# Patient Record
Sex: Female | Born: 1945 | Race: White | Hispanic: No | Marital: Married | Smoking: Never smoker
Health system: Southern US, Community
[De-identification: ages and names within clinical notes are randomized; demographics above are authoritative.]

## PROBLEM LIST (undated history)

## (undated) DIAGNOSIS — M1990 Unspecified osteoarthritis, unspecified site: Secondary | ICD-10-CM

## (undated) DIAGNOSIS — I10 Essential (primary) hypertension: Secondary | ICD-10-CM

## (undated) DIAGNOSIS — Z98811 Dental restoration status: Secondary | ICD-10-CM

## (undated) DIAGNOSIS — B059 Measles without complication: Secondary | ICD-10-CM

## (undated) DIAGNOSIS — M7071 Other bursitis of hip, right hip: Secondary | ICD-10-CM

## (undated) DIAGNOSIS — J309 Allergic rhinitis, unspecified: Secondary | ICD-10-CM

## (undated) DIAGNOSIS — B069 Rubella without complication: Secondary | ICD-10-CM

## (undated) DIAGNOSIS — K859 Acute pancreatitis without necrosis or infection, unspecified: Secondary | ICD-10-CM

## (undated) DIAGNOSIS — T8859XA Other complications of anesthesia, initial encounter: Secondary | ICD-10-CM

## (undated) DIAGNOSIS — M7062 Trochanteric bursitis, left hip: Secondary | ICD-10-CM

## (undated) DIAGNOSIS — B009 Herpesviral infection, unspecified: Secondary | ICD-10-CM

## (undated) DIAGNOSIS — M8580 Other specified disorders of bone density and structure, unspecified site: Secondary | ICD-10-CM

## (undated) DIAGNOSIS — Z8719 Personal history of other diseases of the digestive system: Secondary | ICD-10-CM

## (undated) DIAGNOSIS — L309 Dermatitis, unspecified: Secondary | ICD-10-CM

## (undated) DIAGNOSIS — A048 Other specified bacterial intestinal infections: Secondary | ICD-10-CM

## (undated) DIAGNOSIS — B269 Mumps without complication: Secondary | ICD-10-CM

## (undated) DIAGNOSIS — R2230 Localized swelling, mass and lump, unspecified upper limb: Secondary | ICD-10-CM

## (undated) DIAGNOSIS — K219 Gastro-esophageal reflux disease without esophagitis: Secondary | ICD-10-CM

## (undated) DIAGNOSIS — T4145XA Adverse effect of unspecified anesthetic, initial encounter: Secondary | ICD-10-CM

## (undated) HISTORY — PX: TUBAL LIGATION: SHX77

## (undated) HISTORY — DX: Other specified disorders of bone density and structure, unspecified site: M85.80

## (undated) HISTORY — PX: ABDOMINAL HYSTERECTOMY: SHX81

## (undated) HISTORY — DX: Acute pancreatitis without necrosis or infection, unspecified: K85.90

## (undated) HISTORY — PX: CATARACT EXTRACTION W/ INTRAOCULAR LENS  IMPLANT, BILATERAL: SHX1307

## (undated) HISTORY — PX: OTHER SURGICAL HISTORY: SHX169

## (undated) HISTORY — DX: Other specified bacterial intestinal infections: A04.8

## (undated) HISTORY — PX: MOUTH SURGERY: SHX715

## (undated) HISTORY — PX: ROTATOR CUFF REPAIR: SHX139

## (undated) HISTORY — DX: Allergic rhinitis, unspecified: J30.9

## (undated) HISTORY — PX: EYE SURGERY: SHX253

## (undated) HISTORY — DX: Herpesviral infection, unspecified: B00.9

---

## 1987-02-25 HISTORY — PX: OTHER SURGICAL HISTORY: SHX169

## 1998-02-21 ENCOUNTER — Ambulatory Visit (HOSPITAL_COMMUNITY): Admission: RE | Admit: 1998-02-21 | Discharge: 1998-02-21 | Payer: Self-pay | Admitting: Internal Medicine

## 1998-08-29 ENCOUNTER — Other Ambulatory Visit: Admission: RE | Admit: 1998-08-29 | Discharge: 1998-08-29 | Payer: Self-pay | Admitting: Obstetrics and Gynecology

## 1999-09-20 ENCOUNTER — Other Ambulatory Visit: Admission: RE | Admit: 1999-09-20 | Discharge: 1999-09-20 | Payer: Self-pay | Admitting: Obstetrics and Gynecology

## 2000-09-21 ENCOUNTER — Other Ambulatory Visit: Admission: RE | Admit: 2000-09-21 | Discharge: 2000-09-21 | Payer: Self-pay | Admitting: Obstetrics and Gynecology

## 2001-11-25 ENCOUNTER — Other Ambulatory Visit: Admission: RE | Admit: 2001-11-25 | Discharge: 2001-11-25 | Payer: Self-pay | Admitting: Obstetrics and Gynecology

## 2003-02-13 ENCOUNTER — Encounter (INDEPENDENT_AMBULATORY_CARE_PROVIDER_SITE_OTHER): Payer: Self-pay | Admitting: *Deleted

## 2003-02-13 ENCOUNTER — Ambulatory Visit (HOSPITAL_COMMUNITY): Admission: RE | Admit: 2003-02-13 | Discharge: 2003-02-13 | Payer: Self-pay | Admitting: Vascular Surgery

## 2003-02-13 HISTORY — PX: VARICOSE VEIN SURGERY: SHX832

## 2003-03-08 ENCOUNTER — Other Ambulatory Visit: Admission: RE | Admit: 2003-03-08 | Discharge: 2003-03-08 | Payer: Self-pay | Admitting: Obstetrics and Gynecology

## 2003-04-11 ENCOUNTER — Other Ambulatory Visit: Admission: RE | Admit: 2003-04-11 | Discharge: 2003-04-11 | Payer: Self-pay | Admitting: Obstetrics and Gynecology

## 2003-04-20 ENCOUNTER — Encounter: Admission: RE | Admit: 2003-04-20 | Discharge: 2003-04-20 | Payer: Self-pay | Admitting: Family Medicine

## 2003-07-20 ENCOUNTER — Encounter: Payer: Self-pay | Admitting: Internal Medicine

## 2003-07-27 ENCOUNTER — Emergency Department (HOSPITAL_COMMUNITY): Admission: EM | Admit: 2003-07-27 | Discharge: 2003-07-27 | Payer: Self-pay | Admitting: Emergency Medicine

## 2004-03-19 ENCOUNTER — Other Ambulatory Visit: Admission: RE | Admit: 2004-03-19 | Discharge: 2004-03-19 | Payer: Self-pay | Admitting: Obstetrics and Gynecology

## 2005-06-30 ENCOUNTER — Other Ambulatory Visit: Admission: RE | Admit: 2005-06-30 | Discharge: 2005-06-30 | Payer: Self-pay | Admitting: Obstetrics and Gynecology

## 2006-08-11 ENCOUNTER — Other Ambulatory Visit: Admission: RE | Admit: 2006-08-11 | Discharge: 2006-08-11 | Payer: Self-pay | Admitting: Obstetrics and Gynecology

## 2006-12-15 ENCOUNTER — Ambulatory Visit: Payer: Self-pay | Admitting: Vascular Surgery

## 2007-08-12 ENCOUNTER — Other Ambulatory Visit: Admission: RE | Admit: 2007-08-12 | Discharge: 2007-08-12 | Payer: Self-pay | Admitting: Obstetrics and Gynecology

## 2008-06-20 ENCOUNTER — Encounter (INDEPENDENT_AMBULATORY_CARE_PROVIDER_SITE_OTHER): Payer: Self-pay | Admitting: *Deleted

## 2008-07-12 ENCOUNTER — Ambulatory Visit: Payer: Self-pay | Admitting: Internal Medicine

## 2008-07-25 ENCOUNTER — Ambulatory Visit: Payer: Self-pay | Admitting: Internal Medicine

## 2008-08-15 ENCOUNTER — Encounter: Payer: Self-pay | Admitting: Obstetrics and Gynecology

## 2008-08-15 ENCOUNTER — Ambulatory Visit: Payer: Self-pay | Admitting: Obstetrics and Gynecology

## 2008-08-15 ENCOUNTER — Other Ambulatory Visit: Admission: RE | Admit: 2008-08-15 | Discharge: 2008-08-15 | Payer: Self-pay | Admitting: Obstetrics and Gynecology

## 2009-09-12 ENCOUNTER — Ambulatory Visit: Payer: Self-pay | Admitting: Obstetrics and Gynecology

## 2009-09-12 ENCOUNTER — Other Ambulatory Visit: Admission: RE | Admit: 2009-09-12 | Discharge: 2009-09-12 | Payer: Self-pay | Admitting: Obstetrics and Gynecology

## 2009-09-24 ENCOUNTER — Ambulatory Visit: Payer: Self-pay | Admitting: Obstetrics and Gynecology

## 2010-01-09 ENCOUNTER — Encounter: Admission: RE | Admit: 2010-01-09 | Discharge: 2010-01-09 | Payer: Self-pay | Admitting: Family Medicine

## 2010-01-09 ENCOUNTER — Encounter (INDEPENDENT_AMBULATORY_CARE_PROVIDER_SITE_OTHER): Payer: Self-pay | Admitting: *Deleted

## 2010-01-12 ENCOUNTER — Encounter (INDEPENDENT_AMBULATORY_CARE_PROVIDER_SITE_OTHER): Payer: Self-pay | Admitting: *Deleted

## 2010-01-12 ENCOUNTER — Emergency Department (HOSPITAL_BASED_OUTPATIENT_CLINIC_OR_DEPARTMENT_OTHER): Admission: EM | Admit: 2010-01-12 | Discharge: 2010-01-12 | Payer: Self-pay | Admitting: Emergency Medicine

## 2010-01-12 ENCOUNTER — Ambulatory Visit: Payer: Self-pay | Admitting: Diagnostic Radiology

## 2010-01-14 ENCOUNTER — Telehealth: Payer: Self-pay | Admitting: Internal Medicine

## 2010-01-16 ENCOUNTER — Ambulatory Visit: Payer: Self-pay | Admitting: Gastroenterology

## 2010-01-16 DIAGNOSIS — R112 Nausea with vomiting, unspecified: Secondary | ICD-10-CM | POA: Insufficient documentation

## 2010-01-16 DIAGNOSIS — K5900 Constipation, unspecified: Secondary | ICD-10-CM | POA: Insufficient documentation

## 2010-01-16 DIAGNOSIS — J309 Allergic rhinitis, unspecified: Secondary | ICD-10-CM | POA: Insufficient documentation

## 2010-01-16 DIAGNOSIS — F411 Generalized anxiety disorder: Secondary | ICD-10-CM | POA: Insufficient documentation

## 2010-01-16 DIAGNOSIS — K5730 Diverticulosis of large intestine without perforation or abscess without bleeding: Secondary | ICD-10-CM | POA: Insufficient documentation

## 2010-01-16 DIAGNOSIS — E785 Hyperlipidemia, unspecified: Secondary | ICD-10-CM | POA: Insufficient documentation

## 2010-01-16 DIAGNOSIS — Z860100 Personal history of colon polyps, unspecified: Secondary | ICD-10-CM | POA: Insufficient documentation

## 2010-01-24 ENCOUNTER — Telehealth (INDEPENDENT_AMBULATORY_CARE_PROVIDER_SITE_OTHER): Payer: Self-pay | Admitting: *Deleted

## 2010-01-30 ENCOUNTER — Ambulatory Visit (HOSPITAL_COMMUNITY)
Admission: RE | Admit: 2010-01-30 | Discharge: 2010-01-30 | Payer: Self-pay | Source: Home / Self Care | Attending: Gastroenterology | Admitting: Gastroenterology

## 2010-02-01 ENCOUNTER — Telehealth: Payer: Self-pay | Admitting: Physician Assistant

## 2010-02-05 ENCOUNTER — Telehealth (INDEPENDENT_AMBULATORY_CARE_PROVIDER_SITE_OTHER): Payer: Self-pay | Admitting: *Deleted

## 2010-02-05 ENCOUNTER — Encounter (INDEPENDENT_AMBULATORY_CARE_PROVIDER_SITE_OTHER): Payer: Self-pay | Admitting: *Deleted

## 2010-02-05 ENCOUNTER — Ambulatory Visit: Payer: Self-pay | Admitting: Internal Medicine

## 2010-02-07 ENCOUNTER — Ambulatory Visit: Payer: Self-pay | Admitting: Internal Medicine

## 2010-02-07 ENCOUNTER — Encounter: Payer: Self-pay | Admitting: Internal Medicine

## 2010-02-07 DIAGNOSIS — K2970 Gastritis, unspecified, without bleeding: Secondary | ICD-10-CM | POA: Insufficient documentation

## 2010-02-07 DIAGNOSIS — K2990 Gastroduodenitis, unspecified, without bleeding: Secondary | ICD-10-CM

## 2010-02-12 LAB — CONVERTED CEMR LAB: UREASE: POSITIVE

## 2010-03-12 ENCOUNTER — Telehealth: Payer: Self-pay | Admitting: Internal Medicine

## 2010-03-26 NOTE — Progress Notes (Signed)
Summary: triage  Phone Note From Other Clinic Call back at [phone]   Caller: Crystal, nurse Call For: Dr. Marina Goodell Reason for Call: Schedule Patient Appt Summary of Call: pt had ER visit over the weekend for abd pain... wants pt worked in sooner then currently sch'ed for 01/04 Initial call taken by: Vallarie Mare,  January 14, 2010 4:06 PM  Follow-up for Phone Call        pt. given appt. with N.P. for Wednesday. Follow-up by: Teryl Lucy RN,  January 15, 2010 8:00 AM

## 2010-03-26 NOTE — Procedures (Signed)
Summary: [facility] COLON   Colonoscopy  Procedure date:  07/20/2003  Findings:      Location:  [facility].    Procedures Next Due Date:    Colonoscopy: 07/2008 Patient Name: Kayla Stokes, Kayla Stokes MRN:  Procedure Procedures: Colonoscopy CPT: 21308.  Personnel: Endoscopist: Wilhemina Bonito. Marina Goodell, MD.  Referred By: Corinna Capra, MD.  Exam Location: Exam performed in Outpatient Clinic. Outpatient  Patient Consent: Procedure, Alternatives, Risks and Benefits discussed, consent obtained, from patient. Consent was obtained by the RN.  Indications Symptoms: Abdominal pain / bloating.  Increased Risk Screening: For family history of colorectal neoplasia, in  parent age at onset: 63.  History  Current Medications: Patient is not currently taking Coumadin.  Pre-Exam Physical: Performed Jul 20, 2003. Entire physical exam was normal.  Exam Exam: Extent of exam reached: Cecum, extent intended: Cecum.  The cecum was identified by appendiceal orifice and IC valve. Patient position: on left side. Colon retroflexion performed. Images taken. ASA Classification: II. Tolerance: excellent.  Monitoring: Pulse and BP monitoring, Oximetry used. Supplemental O2 given.  Colon Prep Used Miralax for colon prep. Prep results: excellent.  Sedation Meds: Patient assessed and found to be appropriate for moderate (conscious) sedation. Fentanyl 125 mcg. given IV. Versed 12 given IV.  Findings NORMAL EXAM: Cecum to Rectum.  - DIVERTICULOSIS: Sigmoid Colon. ICD9: Diverticulosis, Colon: 562.10. Comments: mild.    Comments: NO POLYPS SEEN Assessment Abnormal examination, see findings above.  Diagnoses: 562.10: Diverticulosis, Colon.   Events  Unplanned Interventions: No intervention was required.  Unplanned Events: There were no complications. Plans Disposition: After procedure patient sent to recovery. After recovery patient sent home.  Scheduling/Referral: Colonoscopy, to Wilhemina Bonito.  Marina Goodell, MD, in 5 years,

## 2010-03-26 NOTE — Procedures (Signed)
Summary: [facility] EGD    EGD  Procedure date:  07/20/2003  Findings:      Location: [facility]   Patient Name: [name], Stokes MRN:  Procedure Procedures: Panendoscopy (EGD) CPT: 43235.  Personnel: Endoscopist: Wilhemina Bonito. Marina Goodell, MD.  Referred By: Corinna Capra, MD.  Exam Location: Exam performed in Outpatient Clinic. Outpatient  Patient Consent: Procedure, Alternatives, Risks and Benefits discussed, consent obtained, from patient. Consent was obtained by the RN.  Indications Symptoms: Abdominal pain, location: epigastric.  History  Current Medications: Patient is not currently taking Coumadin.  Pre-Exam Physical: Performed Jul 20, 2003  Entire physical exam was normal.  Exam Exam Info: Maximum depth of insertion Duodenum, intended Duodenum. Patient position: on left side. Vocal cords visualized. Gastric retroflexion performed. Images taken. ASA Classification: II. Tolerance: excellent.  Sedation Meds: Patient assessed and found to be appropriate for moderate (conscious) sedation. Residual sedation present from prior procedure today.  Monitoring: BP and pulse monitoring done. Oximetry used. Supplemental O2 given  Findings Normal: Proximal Esophagus to Duodenal 2nd Portion.    Comments: NO CAUSE FOR PAIN FOUND Assessment Normal examination.  Events  Unplanned Intervention: No unplanned interventions were required.  Unplanned Events: There were no complications. Plans Disposition: After procedure patient sent to recovery. After recovery patient sent home.  Comments: CALL FOR ANY RECURRENT PROBLEMS WITH PAIN   CC: BRENT A. Rosezetta Schlatter, MD  This report was created from the original endoscopy report, which was reviewed and signed by the above listed endoscopist.

## 2010-03-26 NOTE — Progress Notes (Signed)
Summary: verify CCK Hidascan appt  Phone Note Call from Patient   Caller: Patient Call For: Lowry Ram CMA Summary of Call: The pt called to verify her CCK Hidascan scheduled at [facility], Radiology Department on 01-30-10.  She is to arrive at 10:45Am.   Initial call taken by: Joselyn Glassman,  January 24, 2010 2:54 PM

## 2010-03-26 NOTE — Assessment & Plan Note (Signed)
Summary: ULTRASOUND DONE-NO GALLSTONES..LSW.   History of Present Illness Visit Type: Initial Consult Primary GI MD: Yancey Flemings MD Primary Provider: Delorse Lek, MD Requesting Provider: Delorse Lek, MD Chief Complaint: Upper abd pain that radiates to her back with nausea and vomiting, and constipation. Pt states has not started any type of stool softner because she is not sure what to do or take   History of Present Illness:   Kayla Stokes 65 YO FEMALE KNOWN TO DR. PERRY. SHE WAS LAST SEEN FOR COLONOSCOPY IN 6/10 FOR POLYP FOLLOW UP. THIS WAS A NEGATIVE EXAM-SHE DOES HAVE SIGMOID DIVERTICULOSIS. SHE COMES IN TODAY WITH A NEW C/O  EPIGASTRIC PAIN. SHE HAD INITIAL ONSET ON 11/13 WITH SEVERE PAIN WHICH WOKE HER UP,RADIATED IN TO HER BACK, AND WAS ASSOCIATED WITH NAUSEA, AND VOMITING. NO DIARRHEA, NO FEVER. SHE WAS SEEN BY PRIMARY ON 11/15-HAD A CT ABD/PELVIS WHICH WAS NEGATIVE. , THEN UPPER ABDOMINAL US ALSO NORMAL. SHE HAD A SECOND INTENSE ATTACK ON 11/18 AGAIN WAKING HER UP WITH PAIN, NAUSEA AND VOMITING. SHE WENT TO THE ER BECAUSE THE PAIN PERSISTED. IT DIDN'T EASE OFF FOR ABOUT 8 HOURS. IN ER;LABS ALL NORMAL INCLUDING AMYLASE AND LIPASE. SHE HAS BEEN EATING EXTREMELY BLAND SINCE, HAS HAD ONGOING MILD DISCOMFORT WHICH SEEMS WORSE WITH EATING-NO FURTHER NAUSEA, VOMITING. MILD CONSTIPATION. SHE HAS BEEN USUNG PEPTO BIMOL, AND HAS TAKEN A COUPLE VICODIN.  NO ETOH, NO REGULAR ASA OR NSAID USE. SHE HAS A VERY STRONG FAMILY HX OF GB DISEASE-5 SISTERS ALL S/P  CHOLECYTECTOMIES.   GI Review of Systems    Reports abdominal pain, nausea, and  vomiting.     Location of  Abdominal pain: upper abdomen.    Denies acid reflux, belching, bloating, chest pain, dysphagia with liquids, dysphagia with solids, heartburn, loss of appetite, vomiting blood, weight loss, and  weight gain.      Reports change in bowel habits and  constipation.     Denies anal fissure, black tarry stools, diarrhea, diverticulosis,  fecal incontinence, heme positive stool, hemorrhoids, irritable bowel syndrome, jaundice, light color stool, liver problems, rectal bleeding, and  rectal pain.    Current Medications (verified): 1)  Vitamin D 3 / 2000 Unit .... Take 1 Tab Daily 2)  Flaxseed Oil 1000 Mg Caps (Flaxseed (Linseed)) .... Take 1 Tab 3)  Multivitamins  Tabs (Multiple Vitamin) .... Take 1 Tab Daily 4)  Calcium 500 Mg Tabs (Calcium) .... Take 1 Tab Daily 5)  Magnesium Oxide 400 Mg Tabs (Magnesium Oxide) .... Take 1 Tab Daily 6)  Claritin 10 Mg Tabs (Loratadine) .... Take 1 Tab Daily 7)  Aspir-Low 81 Mg Tbec (Aspirin) .... Take 1 Tab Daily 8)  Premarin 0.3 Mg Tabs (Estrogens Conjugated) .... Take 1 Tab Daily 9)  Triamcinolone Acetonide 0.1 % Crea (Triamcinolone Acetonide) .... Apply Cream Twice Daily 10)  Crestor 10 Mg Tabs (Rosuvastatin Calcium) .... Take 1/2 Tab Daily 11)  Alprazolam 0.25 Mg Tabs (Alprazolam) .... Take 1 Tab Daily 12)  Triamterene-Hctz 37.5-25 Mg Tabs (Triamterene-Hctz) .... One Tablet By Mouth Once Daily 13)  Hydrocodone-Acetaminophen 5-325 Mg Tabs (Hydrocodone-Acetaminophen) .Marland Kitchen.. 1-2 Tablets By Mouth Every 4-6 Hours As Needed For Pain 14)  Zofran 8 Mg Tabs (Ondansetron Hcl) .... One Tablet By Mouth Two Times A Day As Needed For Nausea 15)  Pepto-Bismol 524 Mg/8ml Susp (Bismuth Subsalicylate) .... As Needed  Allergies (verified): No Known Drug Allergies  Past History:  Past Medical History: ALLERGIC RHINITIS (ICD-477.9) ANXIETY (ICD-300.00) ADENOCARCINOMA, COLON, FAMILY HX (  ICD-V16.0) DIVERTICULAR DISEASE (ICD-562.10) HYPERLIPIDEMIA (ICD-272.4)  HYPERTENSION (ICD-401.9)  Past Surgical History: Oral Surgery Tooth Extraction Hysterectomy Left Leg Vein Stripping   Family History: Family History of Diabetes:  Family History of Heart Disease: Hypertension Family History of  Alcoholism Family History of Stroke Syndrome Family History of Colon Cancer:Mother--70 years   Social  History: Retired Married Patient has never smoked.  Alcohol Use - yes: occ  Daily Caffeine Use: coffee daily  Illicit Drug Use - no Drug Use:  no  Review of Systems       The patient complains of allergy/sinus and back pain.  The patient denies anemia, anxiety-new, arthritis/joint pain, blood in urine, breast changes/lumps, change in vision, confusion, cough, coughing up blood, depression-new, fainting, fatigue, fever, headaches-new, hearing problems, heart murmur, heart rhythm changes, itching, menstrual pain, muscle pains/cramps, night sweats, nosebleeds, pregnancy symptoms, shortness of breath, skin rash, sleeping problems, sore throat, swelling of feet/legs, swollen lymph glands, thirst - excessive , urination - excessive , urination changes/pain, urine leakage, vision changes, and voice change.         SEE HPI  Vital Signs:  Patient profile:   65 year old female Height:      65 inches Weight:      161 pounds BMI:     26.89 BSA:     1.81 Pulse rate:   88 / minute Pulse rhythm:   regular BP sitting:   132 / 76  (left arm) Cuff size:   regular  Vitals Entered By: Ok Anis CMA (January 16, 2010 9:36 AM)  Physical Exam  General:  Well developed, well nourished, no acute distress. Head:  Normocephalic and atraumatic. Eyes:  PERRLA, no icterus. Lungs:  Clear throughout to auscultation. Heart:  Regular rate and rhythm; no murmurs, rubs,  or bruits. Abdomen:  SOFT, MILD TENDERNESS EPIGASTRIUM AND RUQ, NO GUARDING, NO REBOUND, NO MASS OR HSM,BS+ Rectal:  NOT DONE Extremities:  No clubbing, cyanosis, edema or deformities noted. Neurologic:  Alert and  oriented x4;  grossly normal neurologically. Psych:  Alert and cooperative. Normal mood and affect.   Impression & Recommendations:  Problem # 1:  ABDOMINAL PAIN-EPIGASTRIC (ICD-789.06) Assessment New  65 YO FEMALE WITH 10 DAY HX OF EPIGASTRIC PAIN-SOMEWHAT EPISODIC , ASSOCIATED WITH NAUSEA AND VOMITING. WORK-UP THUS FAR  NEGATIVE.  R/O  GASTRITIS,R/O VIRAL SYNDROME WITH GASTROPATHY, R/O GB DISEASE-BILIARY DYSKINESIA  START ACIPHEX 20 MG TWICE DAILY X 2 WEEKS (SAMPLES GIVEN) SCHEDULE  CCK HIDA SCAN. CONTINUE BLAND, LOW FAT  DIET. ZOFRAN  8   MG Q 6 HOURS as needed MIRALAX 17 GM DAILY AS NEEDED FOR MILD CONSTIPATION PT ADVISED TO GO TO ER, REPEAT LABS IF SHE HAS ANOTHER SEVERE EPISODE OVER THE WEEKEND.  Orders: HIDA CCK (HIDA CCK)  Problem # 2:  DIVERTICULAR DISEASE (ICD-562.10) Assessment: Comment Only  Problem # 3:  ADENOCARCINOMA, COLON, FAMILY HX (ICD-V16.0) Assessment: Comment Only MOTHER-PT IS UP TO DATE WITH SCRRENING, DUE FOR FOLLOW UP COLON 2015  Problem # 4:  HYPERTENSION (ICD-401.9) Assessment: Comment Only  Problem # 5:  HYPERLIPIDEMIA (ICD-272.4) Assessment: Comment Only  Patient Instructions: 1)  We have given you samples of Aciphex.  Take 1 tab twice daily until samples gone. 2)  We have also given you some Miralax samples. 3)  We have scheduled the Hidascan at [facility] on 01-30-10. 4)  We will call you with the result. 5)  Directions and brochure provided. 6)  Copy sent to :  Dr. Jonita Albee 7)  The medication list was reviewed and reconciled.  All changed / newly prescribed medications were explained.  A complete medication list was provided to the patient / caregiver.

## 2010-03-28 NOTE — Miscellaneous (Signed)
Summary: Orders Update clotest  Clinical Lists Changes  Problems: Added new problem of GASTRITIS (ICD-535.50) Orders: Added new Test order of TLB-H Pylori Screen Gastric Biopsy (83013-CLOTEST) - Signed 

## 2010-03-28 NOTE — Progress Notes (Signed)
Summary: results  Phone Note Call from Patient Call back at Home Phone [phone] Call back at (cell) [phone]   Caller: Patient Call For: Mike Gip Reason for Call: Talk to Nurse Details for Reason: Results Summary of Call: Please call patient with results of HIDA scan. If she can't be reached at home, please try cell. Initial call taken by: Schuyler Amor,  February 01, 2010 10:40 AM  Follow-up for Phone Call        Pt is calling back today about the result for her HIDA scan from last week, she still has not been contacted with her results and was wondering if we had gotten those in you can reach her at [phone] today Follow-up by: [place] Johnson,  February 04, 2010 9:41 AM  Additional Follow-up for Phone Call Additional follow up Details #1::        I spoke to the pt and advised Dr. Arlyce Dice was ill on Fri the 9th and just signed off on the test.  Amy needs to review it and I will call her  back.  I called her back per Amy Esterwood PA-C and the test was normal.  She and Dr. Marina Goodell will be discussing things further and we will call the pt back.  We may need her to have further testing. Additional Follow-up by: Joselyn Glassman,  February 04, 2010 12:23 PM

## 2010-03-28 NOTE — Progress Notes (Signed)
Summary: Re-testing H.Pylori  Phone Note Call from Patient Call back at Home Phone [phone]   Call For: Dr Marina Goodell Reason for Call: Talk to Nurse Summary of Call: Wonders if her Hpilory will be re-tested to make sure medicine worked? Initial call taken by: Leanor Kail [facility],  March 12, 2010 10:39 AM  Follow-up for Phone Call        Had endo 02/07/10 and was given Prevpac 02/12/10.Read about breath test and blood work for [employer] since she read it is hard to get rid of.She is not symptomatic .Only complaint is bloating. Follow-up by: Teryl Lucy RN,  March 12, 2010 2:31 PM  Additional Follow-up for Phone Call Additional follow up Details #1::        It is not the standard to retest after treatment and generally not "hard to get rid of..." If she wants, she could do a stool for H pylori antigen. I would not recommend this and I'm not sure if her insurance would cover this... Additional Follow-up by: Hilarie Fredrickson MD,  March 12, 2010 4:22 PM    Additional Follow-up for Phone Call Additional follow up Details #2::    Pt. ntfd. of Dr.Perry's recommendation's and she does not wish to proceed with any stool studies. Follow-up by: Teryl Lucy RN,  March 13, 2010 8:45 AM

## 2010-03-28 NOTE — Progress Notes (Signed)
  Phone Note Outgoing Call   Summary of Call: Pt. scheduled for EGD at 8 a.m. on Thursday12/15/11 at [facility].Will come for pre-visit today.

## 2010-03-28 NOTE — Miscellaneous (Signed)
Summary: Prev Pac rx  Clinical Lists Changes  Medications: Added new medication of PREVPAC   MISC (AMOXICILL-CLARITHRO-LANSOPRAZ) as directed per pharmacy - Signed Rx of [facility]   MISC (AMOXICILL-CLARITHRO-LANSOPRAZ) as directed per pharmacy;  #1 x 0;  Signed;  Entered by: Karl Bales RN;  Authorized by: Hilarie Fredrickson MD;  Method used: Electronically to [facility] [phone]*, [address], [place], [place]  [address], Ph: [phone] or [phone], Fax: [phone]    Prescriptions: PREVPAC   MISC (AMOXICILL-CLARITHRO-LANSOPRAZ) as directed per pharmacy  #1 x 0   Entered by:   Karl Bales RN   Authorized by:   Hilarie Fredrickson MD   Signed by:   Karl Bales RN on 02/12/2010   Method used:   [facility]ronically to        [facility] [phone]* (retail)       [address]       [place], [place]  [address]       Ph: [phone] or [phone]       Fax: [phone]   RxID:   [id number]

## 2010-03-28 NOTE — Procedures (Signed)
Summary: Upper Endoscopy  Patient: [name] Note: All result statuses are Final unless otherwise noted.  Tests: (1) Upper Endoscopy (EGD)   EGD Upper Endoscopy       DONE     [facility]     [address]     [place], [place]  [address]           ENDOSCOPY PROCEDURE REPORT           PATIENT:  Kayla, Stokes  MR#:  [id number]     BIRTHDATE:  21-Jun-1945, 64 yrs. old  GENDER:  female           ENDOSCOPIST:  Wilhemina Bonito. Eda Keys, MD     Referred by:  Office           PROCEDURE DATE:  02/07/2010     PROCEDURE:  EGD with biopsy, 56387     ASA CLASS:  Class II     INDICATIONS:  epigastric pain           MEDICATIONS:   Fentanyl 50 mcg IV, Versed 5 mg IV     TOPICAL ANESTHETIC:  Exactacain Spray           DESCRIPTION OF PROCEDURE:   After the risks benefits and     alternatives of the procedure were thoroughly explained, informed     consent was obtained.  The LB GIF-H180 [id number] endoscope was     introduced through the mouth and advanced to the second portion of     the duodenum, without limitations.  The instrument was slowly     withdrawn as the mucosa was fully examined.     <<PROCEDUREIMAGES>>           Mild gastritis was found in the body of the stomach.  The upper,     middle, and distal third of the esophagus were carefully inspected     and no abnormalities were noted. The z-line was well seen at the     GEJ. The endoscope was pushed into the fundus which was normal     including a retroflexed view. The antrum,gastric body, first and     second part of the duodenum were otherwise unremarkable.CLO bx     taken.    Retroflexed views revealed no abnormalities.    The     scope was then withdrawn from the patient and the procedure     completed.           COMPLICATIONS:  None           ENDOSCOPIC IMPRESSION:     1) Mild gastritis in the body of the stomach     2) Otherwise, Normal EGD           RECOMMENDATIONS:     1) Rx CLO if positive     2) Follow-up as  needed           ______________________________     Wilhemina Bonito. Eda Keys, MD           CC:  Marjory Lies, MD, The Patient           n.     eSIGNED:   Wilhemina Bonito. Eda Keys at 02/07/2010 08:27 AM           Kayla Stokes, [id number]  Note: An exclamation mark (!) indicates a result that was not dispersed into the flowsheet. Document Creation Date: 02/07/2010 8:27 AM _______________________________________________________________________  (1) Order result status: Final Collection or  observation date-time: 02/07/2010 08:21 Requested date-time:  Receipt date-time:  Reported date-time:  Referring Physician:   Ordering Physician: Fransico Setters [phone]) Specimen Source:  Source: Launa Grill Order Number: [id number] Lab site:

## 2010-03-28 NOTE — Letter (Signed)
Summary: EGD Instructions  [facility]  [address] [place], [place] [address]   Phone: [phone]  Fax: [phone]       Kayla Stokes    01/05/1946    MRN: [id number]       Procedure Day Dorna Bloom:  Lenor Coffin 02/07/10     Arrival Time: 7:30AM     Procedure Time: 8:00AM     Location of Procedure:                    [facility] (4th Floor)  PREPARATION FOR ENDOSCOPY   On 02/07/10 THE DAY OF THE PROCEDURE:  1.   No solid foods, milk or milk products are allowed after midnight the night before your procedure.  2.   Do not drink anything colored red or purple.  Avoid juices with pulp.  No orange juice.  3.  You may drink clear liquids until 6:00AM, which is 2 hours before your procedure.                                                                                                CLEAR LIQUIDS INCLUDE: Water Jello Ice Popsicles Tea (sugar ok, no milk/cream) Powdered fruit flavored drinks Coffee (sugar ok, no milk/cream) Gatorade Juice: apple, white grape, white cranberry  Lemonade Clear bullion, consomm, broth Carbonated beverages (any kind) Strained chicken noodle soup Hard Candy   MEDICATION INSTRUCTIONS  Unless otherwise instructed, you should take regular prescription medications with a small sip of water as early as possible the morning of your procedure.   Additional medication instructions: Hold Triamterene/HCTZ the morning of procedure.             OTHER INSTRUCTIONS  You will need a responsible adult at least 63 years of age to accompany you and drive you home.   This person must remain in the waiting room during your procedure.  Wear loose fitting clothing that is easily removed.  Leave jewelry and other valuables at home.  However, you may wish to bring a book to read or an iPod/MP3 player to listen to music as you wait for your procedure to start.  Remove all body piercing jewelry and leave at home.  Total  time from sign-in until discharge is approximately 2-3 hours.  You should go home directly after your procedure and rest.  You can resume normal activities the day after your procedure.  The day of your procedure you should not:   Drive   Make legal decisions   Operate machinery   Drink alcohol   Return to work  You will receive specific instructions about eating, activities and medications before you leave.    The above instructions have been reviewed and explained to me by  Wyona Almas RN  February 05, 2010 4:09 PM     I fully understand and can verbalize these instructions _____________________________ Date _________

## 2010-03-28 NOTE — Miscellaneous (Signed)
Summary: [facility] PREVISIT/PREP  Clinical Lists Changes  Observations: Added new observation of NKA: T (02/05/2010 15:30)

## 2010-05-07 LAB — URINALYSIS, ROUTINE W REFLEX MICROSCOPIC
Bilirubin Urine: NEGATIVE
Glucose, UA: NEGATIVE mg/dL
Ketones, ur: NEGATIVE mg/dL
Leukocytes, UA: NEGATIVE
Nitrite: NEGATIVE
Protein, ur: NEGATIVE mg/dL
Specific Gravity, Urine: 1.024 (ref 1.005–1.030)
Urobilinogen, UA: 0.2 mg/dL (ref 0.0–1.0)
pH: 6 (ref 5.0–8.0)

## 2010-05-07 LAB — COMPREHENSIVE METABOLIC PANEL
ALT: 23 U/L (ref 0–35)
AST: 33 U/L (ref 0–37)
Albumin: 4.3 g/dL (ref 3.5–5.2)
Alkaline Phosphatase: 75 U/L (ref 39–117)
BUN: 21 mg/dL (ref 6–23)
CO2: 26 mEq/L (ref 19–32)
Calcium: 9.7 mg/dL (ref 8.4–10.5)
Chloride: 102 mEq/L (ref 96–112)
Creatinine, Ser: 0.9 mg/dL (ref 0.4–1.2)
GFR calc Af Amer: >60 mL/min (ref >60)
GFR calc non Af Amer: >60 mL/min (ref >60)
Glucose, Bld: 106 mg/dL — ABNORMAL HIGH (ref 70–99)
Potassium: 3 mEq/L — ABNORMAL LOW (ref 3.5–5.1)
Sodium: 142 mEq/L (ref 135–145)
Total Bilirubin: 0.9 mg/dL (ref 0.3–1.2)
Total Protein: 7.6 g/dL (ref 6.0–8.3)

## 2010-05-07 LAB — DIFFERENTIAL
Basophils Absolute: 0 10*3/uL (ref 0.0–0.1)
Basophils Relative: 0 % (ref 0–1)
Eosinophils Absolute: 0.2 10*3/uL (ref 0.0–0.7)
Eosinophils Relative: 3 % (ref 0–5)
Lymphocytes Relative: 25 % (ref 12–46)
Lymphs Abs: 2 10*3/uL (ref 0.7–4.0)
Monocytes Absolute: 0.3 10*3/uL (ref 0.1–1.0)
Monocytes Relative: 4 % (ref 3–12)
Neutro Abs: 5.4 10*3/uL (ref 1.7–7.7)
Neutrophils Relative %: 68 % (ref 43–77)

## 2010-05-07 LAB — CBC
HCT: 40.7 % (ref 36.0–46.0)
Hemoglobin: 14.2 g/dL (ref 12.0–15.0)
MCH: 31.5 pg (ref 26.0–34.0)
MCHC: 34.9 g/dL (ref 30.0–36.0)
MCV: 90.3 fL (ref 78.0–100.0)
Platelets: 293 10*3/uL (ref 150–400)
RBC: 4.51 MIL/uL (ref 3.87–5.11)
RDW: 12.8 % (ref 11.5–15.5)
WBC: 7.9 10*3/uL (ref 4.0–10.5)

## 2010-05-07 LAB — URINE MICROSCOPIC-ADD ON

## 2010-05-07 LAB — POCT CARDIAC MARKERS
CKMB, poc: <1 ng/mL — ABNORMAL LOW (ref 1.0–8.0)
Myoglobin, poc: 52.6 ng/mL (ref 12–200)
Troponin i, poc: <0.05 ng/mL (ref 0.00–0.09)

## 2010-05-07 LAB — LIPASE, BLOOD: Lipase: 72 U/L (ref 23–300)

## 2010-07-09 NOTE — Assessment & Plan Note (Signed)
OFFICE VISIT   Stokes, Kayla L  DOB:  1946-01-12                                       12/15/2006  JYNWG#:[id number]   Patient underwent ligation and stripping of the left greater saphenous  vein from the ankle to the distal thigh by me on December, 2004.  I saw  her last year with some complaints of burning discomfort on the medial  aspect of the left leg between the knee and the ankle, which sounds like  saphenous neuritis, although it did not occur for three years  postoperatively.  Those symptoms resolved, but recently she has had some  sensation along the medial aspect of the left leg, which felt as if warm  water was on the skin along the course of the saphenous vein.  She has  had no distal edema, difficulty ambulating long distances, or tightness  in her calf.  She also denies any cardiac symptoms such as chest pain or  dyspnea on exertion.  She has had no hemiparesis, aphasia, amaurosis  fugax, diplopia, blurred vision, or syncope.   PHYSICAL EXAMINATION:  Her blood pressure is 112/72, heart rate 72,  respirations 16.  Her carotid pulses are 3+ with no audible bruits.  Neurologic exam is normal.  No palpable adenopathy in the neck.  Chest:  Clear to auscultation.  Abdomen is soft and nontender with no masses.  She has 3+ femoral, popliteal, and dorsalis pedis pulses palpable  bilaterally.  Left leg has two well-healed incisions near the medial  malleolus in the distal thigh with the stab phlebectomy wounds being  difficult to see.  She has some hypersensitivity along the course of the  saphenous nerve medially between the knee and the ankle, but no distal  edema is noted.   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  She was reassured regarding  this and will return to see me on a p.r.n. basis.   Quita Skye Hart [place], M.D.  Electronically Signed   JDL/MEDQ  D:  12/15/2006  T:  12/16/2006  Job:  [id number]   cc:   Reuel Boom L. Eda Paschal, M.D.

## 2010-07-12 NOTE — Op Note (Signed)
NAME:  Kayla Stokes, Kayla Stokes                           ACCOUNT NO.:  [id number]   MEDICAL RECORD NO.:  [id number]                   PATIENT TYPE:  OIB   LOCATION:  2876                                 FACILITY:  [facility]   PHYSICIAN:  Quita Skye. Hart Rochester, M.D.               DATE OF BIRTH:  Sep 28, 1945   DATE OF PROCEDURE:  02/13/2003  DATE OF DISCHARGE:  02/13/2003                                 OPERATIVE REPORT   PREOPERATIVE DIAGNOSIS:  Varicose veins, greater saphenous system, left leg.   POSTOPERATIVE DIAGNOSIS:  Varicose veins, greater saphenous system, left  leg.   OPERATION PERFORMED:  Ligation and stripping, greater saphenous vein from  ankle to the distal thigh with excision of multiple varicosities, left leg.   SURGEON:  Quita Skye. Hart Rochester, M.D.   ASSISTANT:  Jerold Coombe, P.A.   ANESTHESIA:  General endotracheal.   DESCRIPTION OF PROCEDURE:  The patient was taken to the operating room and  placed in the upright position at which time the varicosities in the left  leg were marked.  These were mostly around the medial malleolus and the  medial calf region, some anteriorly, some posteriorly and one posteriorly  around the Achilles tendon extending up to the proximal calf.  The patient  was then placed in the supine position, satisfactory general endotracheal  anesthesia was administered.  The left leg was prepped with Betadine  solution and draped in routine sterile manner.  Incision was made anterior  to the medial malleolus, greater saphenous vein identified and ligated  distally and opened  and an intraluminal stripper passed proximally and was  palpated in the distal thigh where a short transverse incision was made.  The vein was identified and ligated proximally and a short stripper head was  secured with a 2-0 silk tie after dividing the vein.  Multiple short  transverse stab wounds were then made over the marked varicosities and these  were all removed using both dissection  and avulsion techniques extending  around the anterior ankle and distal anterior calf and the pretibial region  as well as the posterior calf and one over the Achilles tendon.  When this  was all completed, the patient's leg was wrapped with an Ace and the vein  was stripped from proximal to distal with compression in the calf for 10  minutes.  After hemostasis was achieved, the wounds were all closed with  Vicryl in the subcutaneous fashion with Steri-Strips.  Sterile dressing  applied consisting of 4 x 4s, Webril and an Ace wrap and patient taken to  recovery room  in satisfactory condition.                                               Quita Skye Hart Rochester, M.D.  JDL/MEDQ  D:  02/13/2003  T:  02/14/2003  Job:  [id number]

## 2010-08-30 ENCOUNTER — Encounter: Payer: Self-pay | Admitting: *Deleted

## 2010-09-18 ENCOUNTER — Ambulatory Visit (INDEPENDENT_AMBULATORY_CARE_PROVIDER_SITE_OTHER): Payer: BC Managed Care – PPO | Admitting: Obstetrics and Gynecology

## 2010-09-18 ENCOUNTER — Encounter: Payer: Self-pay | Admitting: Obstetrics and Gynecology

## 2010-09-18 ENCOUNTER — Other Ambulatory Visit (HOSPITAL_COMMUNITY)
Admission: RE | Admit: 2010-09-18 | Discharge: 2010-09-18 | Disposition: A | Discharge status: Home / Self Care | Payer: BC Managed Care – PPO | Source: Ambulatory Visit | Attending: Obstetrics and Gynecology | Admitting: Obstetrics and Gynecology

## 2010-09-18 VITALS — BP 126/80 | Ht 63.75 in | Wt 169.0 lb

## 2010-09-18 DIAGNOSIS — Z01419 Encounter for gynecological examination (general) (routine) without abnormal findings: Secondary | ICD-10-CM | POA: Insufficient documentation

## 2010-09-18 NOTE — Progress Notes (Signed)
The patient came to see me today for her annual gynecological exam. She continues as well in her legs. She is on a diuretic twice a day for that. She also has seen Dr. Hart Rochester had a previous verrucous vein stripping. She remains on Premarin orally for control of her menopausal symptoms. She is up-to-date on mammograms and bone densities. She will schedule her yearly mammogram and since no time to do it again.  Past medical history, social history, family history, review of systems are all reviewed and normal.  Physical exam: HEENT within normal limits Neck within normal limits Breasts were examined both in a sitting and lying position and there are without skin changes or masses. Abdomen is soft without guarding rebound or masses. External within normal limits BUS Within normal limits. Vaginal exam within normal limits. Cervix and uterus surgically absent. Adnexa failed to reveal masses. Rectovaginal confirmatory. Extremities still revealed varicose veins in lower extremities   assessment 1 menopausal symptoms2. Venous insufficiency.  Plan continue Premarin 0.3 mg one daily. Continue triamp/hctz 1 daily to twice daily. Office visit with Dr. Hart Rochester to reassess veins.

## 2010-09-25 ENCOUNTER — Encounter: Payer: Self-pay | Admitting: Obstetrics and Gynecology

## 2010-10-04 ENCOUNTER — Telehealth: Payer: Self-pay

## 2010-10-04 DIAGNOSIS — R609 Edema, unspecified: Secondary | ICD-10-CM

## 2010-10-04 NOTE — Telephone Encounter (Signed)
AT RECENT 09-18-10 AEX YOU APPROVED HER TO HAVE 2 PILLS Q.D. PT NEEDS RX SENT TO HER PHARMACY. PLEASE ORDER RX & SEND IT IN.

## 2010-10-05 MED ORDER — TRIAMTERENE-HCTZ 37.5-25 MG PO CAPS: 1.0000 | ORAL_CAPSULE | Freq: Two times a day (BID) | ORAL | Status: DC

## 2010-10-05 NOTE — Telephone Encounter (Signed)
I called it in 

## 2010-10-06 ENCOUNTER — Other Ambulatory Visit: Payer: Self-pay | Admitting: Obstetrics and Gynecology

## 2010-12-30 ENCOUNTER — Other Ambulatory Visit: Payer: Self-pay | Admitting: Obstetrics and Gynecology

## 2011-02-10 ENCOUNTER — Encounter (HOSPITAL_BASED_OUTPATIENT_CLINIC_OR_DEPARTMENT_OTHER): Payer: Self-pay | Admitting: *Deleted

## 2011-02-10 ENCOUNTER — Other Ambulatory Visit: Payer: Self-pay | Admitting: Orthopedic Surgery

## 2011-02-10 NOTE — Progress Notes (Signed)
NPO AFTER MN. PT ARRIVES AT 0930. NEEDS ISTAT AND EKG.  PRE-ORDERS PENDING

## 2011-02-11 NOTE — H&P (Signed)
CC- Kayla Stokes is a 65 y.o. female who presents with left knee pain.  HPI- . Knee Pain: Patient presents with knee pain involving the  left knee. Onset of the symptoms was several months ago. Inciting event: knee buckled in April.. Current symptoms include giving out and pain located medially. Pain is aggravated by kneeling, pivoting, rising after sitting and squatting.  Patient has had no prior knee problems. Evaluation to date: MRI: abnormal medial meniscal tear. Treatment to date: corticosteroid injection which was not very effective.  Past Medical History  Diagnosis Date  . High cholesterol   . H. pylori infection JAN 2012- RESOLVED  . History of uterine prolapse s/p A & P REPAIR 1988  . Retention of fluid   . Acute meniscal tear, medial LEFT KNEE  . History of URI (upper respiratory infection) D X  01-19-11-- FINISHED ANTIBIOTIC    PT STATES CLEAR CXR DONE 02-03-11 AT DR Kayla Stokes  . Arthritis     LEFT KNEE    Past Surgical History  Procedure Date  . Tubal ligation   . Varicose vein surgery 2004  . Vaginal hysterectomy with a&p repair 1988  . Cataract extraction w/ intraocular lens  implant, bilateral     Prior to Admission medications   Medication Sig Start Date End Date Taking? Authorizing Provider  ALPRAZolam (XANAX) 0.25 MG tablet Take 0.25 mg by mouth at bedtime as needed.    Yes Historical Provider, MD  aspirin 81 MG tablet Take 81 mg by mouth daily.    Yes Historical Provider, MD  Cholecalciferol (VITAMIN D) 1000 UNITS capsule Take 400 Units by mouth daily.    Yes Historical Provider, MD  Flaxseed, Linseed, (FLAXSEED OIL PO) Take 1 capsule by mouth daily.    Yes Historical Provider, MD  MAGNESIUM PO Take 500 mg by mouth daily.    Yes Historical Provider, MD  Multiple Vitamin (MULTIVITAMIN) tablet Take 1 tablet by mouth daily.    Yes Historical Provider, MD  potassium chloride (K-DUR) 10 MEQ tablet Take 10 mEq by mouth 2 (two) times daily.     Yes Historical  Provider, MD  PREMARIN 0.3 MG tablet TAKE 1 TABLET BY MOUTH EVERY DAY **NEEDS OV** 12/30/10  Yes Trellis Paganini, MD  rosuvastatin (CRESTOR) 10 MG tablet Take 5 mg by mouth every evening.    Yes Historical Provider, MD  triamcinolone (KENALOG) 0.1 % cream Apply 1 application topically as needed.    Yes Historical Provider, MD  triamterene-hydrochlorothiazide (DYAZIDE) 37.5-25 MG per capsule Take 1 capsule by mouth 2 (two) times daily. FLUID RETENTION  10/05/10  Yes Trellis Paganini, MD   KNEE EXAM antalgic gait, soft tissue tenderness over medial joint line, reduced range of motion, negative drawer sign, collateral ligaments intact, normal ipsilateral hip exam  Physical Examination: General appearance - alert, well appearing, and in no distress Mental status - alert, oriented to person, place, and time Chest - clear to auscultation, no wheezes, rales or rhonchi, symmetric air entry Heart - normal rate, regular rhythm, normal S1, S2, no murmurs, rubs, clicks or gallops Abdomen - soft, nontender, nondistended, no masses or organomegaly Neurological - alert, oriented, normal speech, no focal findings or movement disorder noted   Asessment/Plan--- Left knee medial meniscal tear- - Plan left knee arthroscopy with meniscal debridement. Procedure risks and potential comps discussed with patient who elects to proceed. Goals are decreased pain and increased function with a high likelihood of achieving both  Kayla Stokes. Kayla Rettinger, MD  02/11/2011, 11:10 PM

## 2011-02-12 ENCOUNTER — Encounter (HOSPITAL_BASED_OUTPATIENT_CLINIC_OR_DEPARTMENT_OTHER)
Admission: RE | Disposition: A | Discharge status: Home / Self Care | Payer: Self-pay | Source: Ambulatory Visit | Attending: Orthopedic Surgery

## 2011-02-12 ENCOUNTER — Encounter (HOSPITAL_BASED_OUTPATIENT_CLINIC_OR_DEPARTMENT_OTHER): Payer: Self-pay | Admitting: Anesthesiology

## 2011-02-12 ENCOUNTER — Other Ambulatory Visit: Payer: Self-pay

## 2011-02-12 ENCOUNTER — Ambulatory Visit (HOSPITAL_BASED_OUTPATIENT_CLINIC_OR_DEPARTMENT_OTHER): Payer: BC Managed Care – PPO | Admitting: Anesthesiology

## 2011-02-12 ENCOUNTER — Ambulatory Visit (HOSPITAL_BASED_OUTPATIENT_CLINIC_OR_DEPARTMENT_OTHER)
Admission: RE | Admit: 2011-02-12 | Discharge: 2011-02-12 | Disposition: A | Discharge status: Home / Self Care | Payer: BC Managed Care – PPO | Source: Ambulatory Visit | Attending: Orthopedic Surgery | Admitting: Orthopedic Surgery

## 2011-02-12 ENCOUNTER — Encounter (HOSPITAL_BASED_OUTPATIENT_CLINIC_OR_DEPARTMENT_OTHER): Payer: Self-pay | Admitting: Orthopedic Surgery

## 2011-02-12 ENCOUNTER — Encounter (HOSPITAL_BASED_OUTPATIENT_CLINIC_OR_DEPARTMENT_OTHER): Payer: Self-pay | Admitting: *Deleted

## 2011-02-12 DIAGNOSIS — S83242A Other tear of medial meniscus, current injury, left knee, initial encounter: Secondary | ICD-10-CM

## 2011-02-12 DIAGNOSIS — M2240 Chondromalacia patellae, unspecified knee: Secondary | ICD-10-CM | POA: Insufficient documentation

## 2011-02-12 DIAGNOSIS — E7800 Pure hypercholesterolemia, unspecified: Secondary | ICD-10-CM | POA: Insufficient documentation

## 2011-02-12 DIAGNOSIS — M25569 Pain in unspecified knee: Secondary | ICD-10-CM | POA: Insufficient documentation

## 2011-02-12 DIAGNOSIS — IMO0002 Reserved for concepts with insufficient information to code with codable children: Secondary | ICD-10-CM | POA: Insufficient documentation

## 2011-02-12 DIAGNOSIS — F411 Generalized anxiety disorder: Secondary | ICD-10-CM | POA: Insufficient documentation

## 2011-02-12 DIAGNOSIS — Z79899 Other long term (current) drug therapy: Secondary | ICD-10-CM | POA: Insufficient documentation

## 2011-02-12 DIAGNOSIS — X58XXXA Exposure to other specified factors, initial encounter: Secondary | ICD-10-CM | POA: Insufficient documentation

## 2011-02-12 DIAGNOSIS — Z7982 Long term (current) use of aspirin: Secondary | ICD-10-CM | POA: Insufficient documentation

## 2011-02-12 HISTORY — DX: Unspecified osteoarthritis, unspecified site: M19.90

## 2011-02-12 HISTORY — PX: CHONDROPLASTY: SHX5177

## 2011-02-12 HISTORY — PX: MENISCUS DEBRIDEMENT: SHX5178

## 2011-02-12 HISTORY — PX: KNEE ARTHROSCOPY: SHX127

## 2011-02-12 LAB — POCT I-STAT 4, (NA,K, GLUC, HGB,HCT): Sodium: 140 mEq/L (ref 135–145)

## 2011-02-12 SURGERY — ARTHROSCOPY, KNEE
Anesthesia: General | Site: Knee | Laterality: Left | Wound class: Clean

## 2011-02-12 MED ORDER — SODIUM CHLORIDE 0.9 % IV SOLN: INTRAVENOUS | Status: DC

## 2011-02-12 MED ORDER — LACTATED RINGERS IV SOLN: INTRAVENOUS | Status: DC

## 2011-02-12 MED ORDER — METHOCARBAMOL 500 MG PO TABS: 500.0000 mg | ORAL_TABLET | Freq: Four times a day (QID) | ORAL | Status: DC

## 2011-02-12 MED ORDER — PROMETHAZINE HCL 25 MG/ML IJ SOLN: 6.2500 mg | INTRAMUSCULAR | Status: DC | PRN

## 2011-02-12 MED ORDER — OXYCODONE-ACETAMINOPHEN 5-325 MG PO TABS
1.0000 | ORAL_TABLET | ORAL | Status: AC | PRN
  Administered 2011-02-12: 1 via ORAL

## 2011-02-12 MED ORDER — LACTATED RINGERS IV SOLN
INTRAVENOUS | Status: DC
Start: 2011-02-12 — End: 2011-02-12
  Administered 2011-02-12: 100 mL/h via INTRAVENOUS

## 2011-02-12 MED ORDER — SODIUM CHLORIDE 0.9 % IR SOLN
Status: DC | PRN
  Administered 2011-02-12: 6000 mL

## 2011-02-12 MED ORDER — FENTANYL CITRATE 0.05 MG/ML IJ SOLN
25.0000 ug | INTRAMUSCULAR | Status: DC | PRN
  Administered 2011-02-12 (×2): 25 ug via INTRAVENOUS

## 2011-02-12 MED ORDER — LIDOCAINE HCL (CARDIAC) 20 MG/ML IV SOLN
INTRAVENOUS | Status: DC | PRN
  Administered 2011-02-12: 80 mg via INTRAVENOUS

## 2011-02-12 MED ORDER — PROPOFOL 10 MG/ML IV EMUL
INTRAVENOUS | Status: DC | PRN
  Administered 2011-02-12: 160 mg via INTRAVENOUS

## 2011-02-12 MED ORDER — DEXAMETHASONE SODIUM PHOSPHATE 4 MG/ML IJ SOLN
INTRAMUSCULAR | Status: DC | PRN
  Administered 2011-02-12: 10 mg via INTRAVENOUS

## 2011-02-12 MED ORDER — METHOCARBAMOL 500 MG PO TABS
500.0000 mg | ORAL_TABLET | Freq: Four times a day (QID) | ORAL | Status: AC
  Administered 2011-02-12: 500 mg via ORAL

## 2011-02-12 MED ORDER — CEFAZOLIN SODIUM-DEXTROSE 2-3 GM-% IV SOLR
2.0000 g | Freq: Once | INTRAVENOUS | Status: AC
  Administered 2011-02-12: 2 g via INTRAVENOUS

## 2011-02-12 MED ORDER — ONDANSETRON HCL 4 MG/2ML IJ SOLN
INTRAMUSCULAR | Status: DC | PRN
  Administered 2011-02-12: 4 mg via INTRAVENOUS

## 2011-02-12 MED ORDER — FENTANYL CITRATE 0.05 MG/ML IJ SOLN
INTRAMUSCULAR | Status: DC | PRN
  Administered 2011-02-12 (×2): 50 ug via INTRAVENOUS

## 2011-02-12 MED ORDER — ACETAMINOPHEN 10 MG/ML IV SOLN
1000.0000 mg | Freq: Once | INTRAVENOUS | Status: AC
  Administered 2011-02-12: 1000 mg via INTRAVENOUS

## 2011-02-12 MED ORDER — MIDAZOLAM HCL 5 MG/5ML IJ SOLN
INTRAMUSCULAR | Status: DC | PRN
  Administered 2011-02-12: 2 mg via INTRAVENOUS

## 2011-02-12 MED ORDER — BUPIVACAINE-EPINEPHRINE 0.25% -1:200000 IJ SOLN
INTRAMUSCULAR | Status: DC | PRN
  Administered 2011-02-12: 30 mL

## 2011-02-12 MED ORDER — OXYCODONE-ACETAMINOPHEN 5-325 MG PO TABS: 1.0000 | ORAL_TABLET | ORAL | Status: AC | PRN

## 2011-02-12 MED ORDER — KETOROLAC TROMETHAMINE 30 MG/ML IJ SOLN
INTRAMUSCULAR | Status: DC | PRN
  Administered 2011-02-12: 30 mg via INTRAVENOUS

## 2011-02-12 MED ORDER — CHLORHEXIDINE GLUCONATE 4 % EX LIQD: 60.0000 mL | Freq: Once | CUTANEOUS | Status: DC

## 2011-02-12 SURGICAL SUPPLY — 39 items
BANDAGE ELASTIC 6 VELCRO ST LF (GAUZE/BANDAGES/DRESSINGS) ×2 IMPLANT
BLADE 4.2CUDA (BLADE) ×2 IMPLANT
BLADE CUDA SHAVER 3.5 (BLADE) IMPLANT
BLADE CUTTER GATOR 3.5 (BLADE) IMPLANT
CANISTER SUCT LVC 12 LTR MEDI- (MISCELLANEOUS) ×2 IMPLANT
CANISTER SUCTION 2500CC (MISCELLANEOUS) IMPLANT
CLOTH BEACON ORANGE TIMEOUT ST (SAFETY) ×2 IMPLANT
DRAPE ARTHROSCOPY W/POUCH 114 (DRAPES) ×3 IMPLANT
DRSG EMULSION OIL 3X3 NADH (GAUZE/BANDAGES/DRESSINGS) ×2 IMPLANT
DURAPREP 26ML APPLICATOR (WOUND CARE) ×2 IMPLANT
ELECT MENISCUS 165MM 90D (ELECTRODE) IMPLANT
ELECT REM PT RETURN 9FT ADLT (ELECTROSURGICAL)
ELECTRODE REM PT RTRN 9FT ADLT (ELECTROSURGICAL) IMPLANT
GLOVE BIO SURGEON STRL SZ8 (GLOVE) ×2 IMPLANT
GLOVE BIOGEL PI IND STRL 6.5 (GLOVE) IMPLANT
GLOVE BIOGEL PI IND STRL 7.0 (GLOVE) IMPLANT
GLOVE BIOGEL PI INDICATOR 6.5 (GLOVE) ×3
GLOVE BIOGEL PI INDICATOR 7.0 (GLOVE) ×1
GLOVE INDICATOR 8.0 STRL GRN (GLOVE) ×2 IMPLANT
GLOVE SKINSENSE NS SZ7.0 (GLOVE) ×1
GLOVE SKINSENSE STRL SZ7.0 (GLOVE) IMPLANT
GOWN PREVENTION PLUS LG XLONG (DISPOSABLE) ×4 IMPLANT
GOWN PREVENTION PLUS XLARGE (GOWN DISPOSABLE) ×1 IMPLANT
IV NS IRRIG 3000ML ARTHROMATIC (IV SOLUTION) ×3 IMPLANT
KNEE WRAP E Z 3 GEL PACK (MISCELLANEOUS) ×2 IMPLANT
PACK ARTHROSCOPY DSU (CUSTOM PROCEDURE TRAY) ×2 IMPLANT
PACK BASIN DAY SURGERY FS (CUSTOM PROCEDURE TRAY) ×2 IMPLANT
PADDING CAST ABS 4INX4YD NS (CAST SUPPLIES) ×1
PADDING CAST ABS COTTON 4X4 ST (CAST SUPPLIES) ×1 IMPLANT
PADDING CAST COTTON 6X4 STRL (CAST SUPPLIES) ×2 IMPLANT
PENCIL BUTTON HOLSTER BLD 10FT (ELECTRODE) IMPLANT
SET ARTHROSCOPY TUBING (MISCELLANEOUS) ×2
SET ARTHROSCOPY TUBING LN (MISCELLANEOUS) ×1 IMPLANT
SPONGE GAUZE 4X4 12PLY (GAUZE/BANDAGES/DRESSINGS) ×2 IMPLANT
SUT ETHILON 4 0 PS 2 18 (SUTURE) ×2 IMPLANT
TOWEL OR 17X24 6PK STRL BLUE (TOWEL DISPOSABLE) ×2 IMPLANT
WAND 30 DEG SABER W/CORD (SURGICAL WAND) IMPLANT
WAND 90 DEG TURBOVAC W/CORD (SURGICAL WAND) ×1 IMPLANT
WATER STERILE IRR 500ML POUR (IV SOLUTION) ×2 IMPLANT

## 2011-02-12 NOTE — Anesthesia Postprocedure Evaluation (Signed)
  Anesthesia Post-op Note  Patient: Kayla Stokes  Procedure(s) Performed:  ARTHROSCOPY KNEE; DEBRIDEMENT OF MENISCUS; CHONDROPLASTY  Patient Location: PACU  Anesthesia Type: General  Level of Consciousness: awake and alert   Airway and Oxygen Therapy: Patient Spontanous Breathing  Post-op Pain: mild  Post-op Assessment: Post-op Vital signs reviewed, Patient's Cardiovascular Status Stable, Respiratory Function Stable, Patent Airway and No signs of Nausea or vomiting  Post-op Vital Signs: stable  Complications: No apparent anesthesia complications

## 2011-02-12 NOTE — Transfer of Care (Signed)
Immediate Anesthesia Transfer of Care Note  Patient: Kayla Stokes  Procedure(s) Performed:  ARTHROSCOPY KNEE; DEBRIDEMENT OF MENISCUS; CHONDROPLASTY  Patient Location: PACU  Anesthesia Type: General  Level of Consciousness: awake and oriented  Airway & Oxygen Therapy: Patient Spontanous Breathing and Patient connected to nasal cannula oxygen  Post-op Assessment: Report given to PACU RN  Post vital signs: Reviewed and stable  Complications: No apparent anesthesia complications

## 2011-02-12 NOTE — Op Note (Signed)
Preoperative diagnosis-  Left knee medial meniscal tear  Postoperative diagnosis Left- knee medial meniscal tear   Plus Left knee chondral defect medial  Procedure- Left knee arthroscopy with medial  Meniscal debridement and chondroplasty  Surgeon- Kayla Stokes. Marlayna Bannister, MD  Anesthesia-General  EBL-  minimal Complications- None  Condition- PACU - hemodynamically stable.  Brief clinical note- -Kayla Stokes is a 65 y.o.  female with a several month history of left knee pain and mechanical symptoms. Exam and history suggested medial meniscal tear confirmed by MRI. The patient presents now for arthroscopy and debridement  Procedure in detail -       After successful administration of General anesthetic, a tourmiquet is placed high on the Left  thigh and the Left lower extremity is prepped and draped in the usual sterile fashion. Time out is performed by the surgical team. Standard superomedial and inferolateral portal sites are marked and incisions made with an 11 blade. The inflow cannula is passed through the superomedial portal and camera through the inferolateral portal and inflow is initiated. Arthroscopic visualization proceeds.      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 The medial and lateral gutters are visualized and there are no loose bodies. Flexion and valgus force is applied to the knee and the medial compartment is entered. A spinal needle is passed into the joint through the site marked for the inferomedial portal. A small incision is made and the dilator passed into the joint. The findings for the medial compartment are medial meniscal tear posterior horn with 2 x 2 cm area of Grade IV cartilage defect and 1 x 1 cm area of unstable cartilage on the medial femoral condyle. . The tear is debrided to a stable base with baskets and a shaver and sealed off with the Arthrocare. The shaver is used to debride the  unstable cartilage to a stable bony base with stable edges. The exposed bone is abraded with the shaver.    The intercondylar notch is visualized and the ACL appears normal. The lateral compartment is entered and the findings are .5 x .5 cm area cartilage defect which was stable and no meniscal tears.     The joint is again inspected and there are no other tears, defects or loose bodies identified. The arthroscopic equipment is then removed from the inferior portals which are closed with interrupted 4-0 nylon. 20 ml of .25% Marcaine with epinephrine are injected through the inflow cannula and the cannula is then removed and the portal closed with nylon. The incisions are cleaned and dried and a bulky sterile dressing is applied. The patient is then awakened and transported to recovery in stable condition.  Kayla Stokes Kayla Kirk, MD    02/12/2011, 12:48 PM   02/12/2011, 12:44 PM

## 2011-02-12 NOTE — Anesthesia Preprocedure Evaluation (Signed)
Anesthesia Evaluation  Patient identified by MRN, date of birth, ID band Patient awake    Reviewed: Allergy & Precautions, H&P , NPO status , Patient's Chart, lab work & pertinent test results  Airway Mallampati: II TM Distance: >3 FB Neck ROM: Full    Dental No notable dental hx. (+) Lower Dentures, Teeth Intact and Dental Advisory Given   Pulmonary neg pulmonary ROS,  clear to auscultation  Pulmonary exam normal       Cardiovascular hypertension, Pt. on medications     Neuro/Psych Anxiety Negative Neurological ROS  Negative Psych ROS   GI/Hepatic negative GI ROS, Neg liver ROS,   Endo/Other  Negative Endocrine ROS  Renal/GU negative Renal ROS  Genitourinary negative   Musculoskeletal negative musculoskeletal ROS (+)   Abdominal   Peds negative pediatric ROS (+)  Hematology negative hematology ROS (+)   Anesthesia Other Findings   Reproductive/Obstetrics negative OB ROS                           Anesthesia Physical Anesthesia Plan  ASA: II  Anesthesia Plan: General   Post-op Pain Management:    Induction: Intravenous  Airway Management Planned: LMA  Additional Equipment:   Intra-op Plan:   Post-operative Plan:   Informed Consent: I have reviewed the patients History and Physical, chart, labs and discussed the procedure including the risks, benefits and alternatives for the proposed anesthesia with the patient or authorized representative who has indicated his/her understanding and acceptance.   Dental advisory given  Plan Discussed with: CRNA and Surgeon  Anesthesia Plan Comments:         Anesthesia Quick Evaluation

## 2011-02-12 NOTE — Anesthesia Procedure Notes (Addendum)
Procedure Name: LMA Insertion Performed by: Kenzie Thoreson Pre-anesthesia Checklist: Patient identified, Emergency Drugs available, Suction available and Patient being monitored Patient Re-evaluated:Patient Re-evaluated prior to inductionOxygen Delivery Method: Circle System Utilized Preoxygenation: Pre-oxygenation with 100% oxygen Intubation Type: IV induction Ventilation: Mask ventilation without difficulty LMA: LMA inserted LMA Size: 4.0 Number of attempts: 1 Airway Equipment and Method: bite block Placement Confirmation: positive ETCO2 Dental Injury: Teeth and Oropharynx as per pre-operative assessment      

## 2011-02-12 NOTE — Interval H&P Note (Signed)
History and Physical Interval Note:  02/12/2011 12:04 PM  Kayla Stokes  has presented today for surgery, with the diagnosis of left knee medial meniscal tear  The various methods of treatment have been discussed with the patient and family. After consideration of risks, benefits and other options for treatment, the patient has consented to  Procedure(s): ARTHROSCOPY KNEE as a surgical intervention .  The patients' history has been reviewed, patient examined, no change in status, stable for surgery.  I have reviewed the patients' chart and labs.  Questions were answered to the patient's satisfaction.     Loanne Drilling

## 2011-02-13 ENCOUNTER — Encounter (HOSPITAL_BASED_OUTPATIENT_CLINIC_OR_DEPARTMENT_OTHER): Payer: Self-pay | Admitting: Orthopedic Surgery

## 2011-02-16 ENCOUNTER — Emergency Department (HOSPITAL_COMMUNITY): Payer: BC Managed Care – PPO

## 2011-02-16 ENCOUNTER — Encounter (HOSPITAL_COMMUNITY): Payer: Self-pay | Admitting: Emergency Medicine

## 2011-02-16 ENCOUNTER — Other Ambulatory Visit: Payer: Self-pay

## 2011-02-16 ENCOUNTER — Emergency Department (HOSPITAL_COMMUNITY)
Admission: EM | Admit: 2011-02-16 | Discharge: 2011-02-16 | Disposition: A | Discharge status: Home / Self Care | Payer: BC Managed Care – PPO | Attending: Emergency Medicine | Admitting: Emergency Medicine

## 2011-02-16 DIAGNOSIS — R42 Dizziness and giddiness: Secondary | ICD-10-CM | POA: Insufficient documentation

## 2011-02-16 DIAGNOSIS — E7800 Pure hypercholesterolemia, unspecified: Secondary | ICD-10-CM | POA: Insufficient documentation

## 2011-02-16 DIAGNOSIS — R29898 Other symptoms and signs involving the musculoskeletal system: Secondary | ICD-10-CM | POA: Insufficient documentation

## 2011-02-16 DIAGNOSIS — R55 Syncope and collapse: Secondary | ICD-10-CM

## 2011-02-16 LAB — BASIC METABOLIC PANEL
BUN: 11 mg/dL (ref 6–23)
Chloride: 94 mEq/L — ABNORMAL LOW (ref 96–112)
GFR calc Af Amer: 66 mL/min — ABNORMAL LOW (ref >90)
GFR calc non Af Amer: 57 mL/min — ABNORMAL LOW (ref >90)
Potassium: 3.3 mEq/L — ABNORMAL LOW (ref 3.5–5.1)

## 2011-02-16 LAB — CBC
Hemoglobin: 13.2 g/dL (ref 12.0–15.0)
MCH: 30.7 pg (ref 26.0–34.0)
MCHC: 33.6 g/dL (ref 30.0–36.0)
Platelets: 266 10*3/uL (ref 150–400)
RDW: 14.2 % (ref 11.5–15.5)

## 2011-02-16 LAB — DIFFERENTIAL
Basophils Absolute: 0 10*3/uL (ref 0.0–0.1)
Basophils Relative: 0 % (ref 0–1)
Eosinophils Absolute: 0.2 10*3/uL (ref 0.0–0.7)
Monocytes Relative: 6 % (ref 3–12)
Neutro Abs: 6.5 10*3/uL (ref 1.7–7.7)
Neutrophils Relative %: 78 % — ABNORMAL HIGH (ref 43–77)

## 2011-02-16 LAB — URINALYSIS, ROUTINE W REFLEX MICROSCOPIC
Bilirubin Urine: NEGATIVE
Ketones, ur: NEGATIVE mg/dL
Leukocytes, UA: NEGATIVE
Nitrite: NEGATIVE
Specific Gravity, Urine: 1.017 (ref 1.005–1.030)
Urobilinogen, UA: 0.2 mg/dL (ref 0.0–1.0)

## 2011-02-16 MED ORDER — SODIUM CHLORIDE 0.9 % IV BOLUS (SEPSIS)
1000.0000 mL | Freq: Once | INTRAVENOUS | Status: AC
  Administered 2011-02-16: 1000 mL via INTRAVENOUS

## 2011-02-16 NOTE — ED Notes (Signed)
Patient stable upon discharge.  

## 2011-02-16 NOTE — ED Notes (Signed)
Per ems, the patient states she started feeling weak and dizzy an hour ago in shower.  States she had a lap done a week ago and has been taking narcotics pain and that the normally takes them with food but did not this am.  Patient was given 4mg  zofran IVP by ems.   #18 Left AC.   cbg 119

## 2011-02-16 NOTE — ED Notes (Signed)
ZOX:[id number]<AV> Expected date:02/16/11<BR> Expected time:11:42 AM<BR> Means of arrival:Ambulance<BR> Comments:<BR> EMS 15 GC- 65 y/o female with a dizzy spell. At this time no complaints. Pt request trans for evaluation. She had Knee surgery at [facility] last week.

## 2011-02-16 NOTE — ED Provider Notes (Signed)
History     CSN: [id number]  Arrival date & time 02/16/11  1203   First MD Initiated Contact with Patient 02/16/11 1222      Chief Complaint  Patient presents with  . Dizziness  . Extremity Weakness    (Consider location/radiation/quality/duration/timing/severity/associated sxs/prior treatment) HPI Patient presents emergent after an episode of syncope. She states she had recent knee surgery, arthroscopic, and she took one of her pain medications this morning. Patient was going into the shower to wash up when she started to feel very hot and flushed. Patient states he began feeling lightheaded as if she was pass out. Her vision seemed to decrease as well. Her family went in to check on her and found her sitting in the bathroom minimally responsive. This lasted for several minutes. Patient was helped to the ground and then regained consciousness. She now states that all her symptoms have resolved and she feels fine. She has not had any chest pain, headache, focal numbness or weakness. She has not had any abdominal pain or noticed any blood in her stool. Past Medical History  Diagnosis Date  . High cholesterol   . H. pylori infection JAN 2012- RESOLVED  . History of uterine prolapse s/p A & P REPAIR 1988  . Retention of fluid   . Acute meniscal tear, medial LEFT KNEE  . History of URI (upper respiratory infection) D X  01-19-11-- FINISHED ANTIBIOTIC    PT STATES CLEAR CXR DONE 02-03-11 AT DR Diego Cory  . Arthritis     LEFT KNEE    Past Surgical History  Procedure Date  . Tubal ligation   . Varicose vein surgery 2004  . Vaginal hysterectomy with a&p repair 1988  . Cataract extraction w/ intraocular lens  implant, bilateral   . Knee arthroscopy 02/12/2011    Procedure: ARTHROSCOPY KNEE;  Surgeon: Loanne Drilling;  Location: [facility];  Service: Orthopedics;  Laterality: Left;  . Meniscus debridement 02/12/2011    Procedure: DEBRIDEMENT OF MENISCUS;  Surgeon:  Loanne Drilling;  Location: [facility];  Service: Orthopedics;  Laterality: Left;  . Chondroplasty 02/12/2011    Procedure: CHONDROPLASTY;  Surgeon: Loanne Drilling;  Location: [facility];  Service: Orthopedics;  Laterality: Left;    Family History  Problem Relation Age of Onset  . Diabetes Mother   . Hypertension Mother   . Heart disease Mother   . Hypertension Father   . Cancer Sister     CERVICAL  . Heart disease Brother   . Cancer Brother     LUNG  . Throat cancer Brother     History  Substance Use Topics  . Smoking status: Never Smoker   . Smokeless tobacco: Never Used  . Alcohol Use: 1.5 oz/week    3 drink(s) per week    OB History    Grav Para Term Preterm Abortions TAB SAB Ect Mult Living   3 3              Review of Systems  Allergies  Macrobid  Home Medications   Current Outpatient Rx  Name Route Sig Dispense Refill  . ALPRAZOLAM 0.25 MG PO TABS Oral Take 0.25 mg by mouth at bedtime as needed. For anxiety.    . ASPIRIN 81 MG PO TABS Oral Take 81 mg by mouth daily.     . ASPIRIN EC 81 MG PO TBEC Oral Take 81 mg by mouth every morning.      Marland Kitchen  CALCIUM 500 PO Oral Take 1 tablet by mouth every morning.      Marland Kitchen VITAMIN D 1000 UNITS PO CAPS Oral Take 1,000 Units by mouth every morning.     Marland Kitchen DIFLUPREDNATE 0.05 % OP EMUL Left Eye Place 1 drop into the left eye 2 (two) times daily.      Marland Kitchen ESTROGENS CONJUGATED 0.3 MG PO TABS Oral Take 0.3 mg by mouth at bedtime. Take daily for 21 days then do not take for 7 days.     . ESTROGENS CONJUGATED 0.3 MG PO TABS       . FLAXSEED OIL PO Oral Take 1 capsule by mouth every morning.     Marland Kitchen LORATADINE 10 MG PO TABS Oral Take 10 mg by mouth daily.      Marland Kitchen MAGNESIUM 500 MG PO TABS Oral Take 1 tablet by mouth every morning.      Marland Kitchen MAGNESIUM PO Oral Take 500 mg by mouth daily.     Marland Kitchen METHOCARBAMOL 500 MG PO TABS Oral Take 500 mg by mouth 4 (four) times daily.      Marland Kitchen MOXIFLOXACIN HCL 0.5 % OP SOLN Left  Eye Place 1 drop into the left eye 3 (three) times daily.      Marland Kitchen ONE-DAILY MULTI VITAMINS PO TABS Oral Take 1 tablet by mouth every morning.     Carma Leaven M PLUS PO TABS Oral Take 1 tablet by mouth every morning.      Marland Kitchen NEPAFENAC 0.1 % OP SUSP Left Eye Place 3 drops into the left eye 3 (three) times daily.      . OXYCODONE-ACETAMINOPHEN 5-325 MG PO TABS Oral Take 1-2 tablets by mouth every 4 (four) hours as needed for pain. 40 tablet 0  . POTASSIUM CHLORIDE CR 10 MEQ PO TBCR Oral Take 10 mEq by mouth 2 (two) times daily.      Marland Kitchen ROSUVASTATIN CALCIUM 10 MG PO TABS Oral Take 5 mg by mouth at bedtime.     . TRIAMCINOLONE ACETONIDE 0.1 % EX CREA Topical Apply 1 application topically 2 (two) times daily as needed. For rash.    . TRIAMTERENE-HCTZ 37.5-25 MG PO CAPS Oral Take 1 capsule by mouth 2 (two) times daily.       BP 119/67  Pulse 70  Temp(Src) 98.3 F (36.8 C) (Oral)  Resp 20  SpO2 96%  Physical Exam  Nursing note and vitals reviewed. Constitutional: She appears well-developed and well-nourished. No distress.  HENT:  Head: Normocephalic and atraumatic.  Right Ear: External ear normal.  Left Ear: External ear normal.  Eyes: Conjunctivae are normal. Right eye exhibits no discharge. Left eye exhibits no discharge. No scleral icterus.  Neck: Neck supple. No tracheal deviation present.  Cardiovascular: Normal rate, regular rhythm and intact distal pulses.   Pulmonary/Chest: Effort normal and breath sounds normal. No stridor. No respiratory distress. She has no wheezes. She has no rales.  Abdominal: Soft. Bowel sounds are normal. She exhibits no distension. There is no tenderness. There is no rebound and no guarding.  Musculoskeletal: She exhibits tenderness. She exhibits no edema.       Left knee status post arthroscopy, wound edges without erythema or exudate, mild amount of bruising around the sites  Neurological: She is alert. She has normal strength. No sensory deficit. Cranial nerve  deficit:  no gross defecits noted. She exhibits normal muscle tone. She displays no seizure activity. Coordination normal.  Skin: Skin is warm and dry. No rash noted.  Psychiatric: She has a normal mood and affect.    ED Course  Procedures (including critical care time)   Rate: 66  Rhythm: normal sinus rhythm  QRS Axis: normal  Intervals: normal  ST/T Wave abnormalities: normal  Conduction Disutrbances:none  Narrative Interpretation:   Old EKG Reviewed: none available  Labs Reviewed  DIFFERENTIAL - Abnormal; Notable for the following:    Neutrophils Relative 78 (*)    All other components within normal limits  BASIC METABOLIC PANEL - Abnormal; Notable for the following:    Potassium 3.3 (*)    Chloride 94 (*)    Glucose, Bld 134 (*)    GFR calc non Af Amer 57 (*)    GFR calc Af Amer 66 (*)    All other components within normal limits  CBC  URINALYSIS, ROUTINE W REFLEX MICROSCOPIC  PROTIME-INR   Ct Head Wo Contrast  02/16/2011  *RADIOLOGY REPORT*  Clinical Data: Dizziness, extremity weakness.  CT HEAD WITHOUT CONTRAST  Technique:  Contiguous axial images were obtained from the base of the skull through the vertex without contrast.  Comparison: None.  Findings: No acute intracranial hemorrhage.  No focal mass lesion. No CT evidence of acute infarction.   No midline shift or mass effect.  No hydrocephalus.  Basilar cisterns are patent.Paranasal sinuses and mastoid air cells are clear.  Orbits are normal.  IMPRESSION:  No acute intracranial findings.  Original Report Authenticated By: Genevive Bi, M.D.     Diagnosis: Syncope   MDM  I suspect patient had a vasovagal episode. She mentions feeling very weak, hot and dizzy while taking a shower. Patient has had recent knee surgery and has been taking pain medications. She did not eat anything before taking them this morning. There was no seizure activity described by family member. Patient currently feels fine and has no  complaints. She is not orthostatic and is able to stand. There are no focal neurologic deficits. I doubt acute cardiac event. Patient is comfortable with going home. I encouraged her to drink plenty of fluids and return to emergency room for any recurrent episodes.        Celene Kras, MD 02/16/11 [phone]

## 2011-03-07 MED FILL — Ondansetron HCl Inj 4 MG/2ML (2 MG/ML): INTRAMUSCULAR | Qty: 2 | Status: AC

## 2011-05-26 DIAGNOSIS — R2230 Localized swelling, mass and lump, unspecified upper limb: Secondary | ICD-10-CM

## 2011-05-26 HISTORY — DX: Localized swelling, mass and lump, unspecified upper limb: R22.30

## 2011-06-02 ENCOUNTER — Encounter (HOSPITAL_BASED_OUTPATIENT_CLINIC_OR_DEPARTMENT_OTHER): Payer: Self-pay | Admitting: *Deleted

## 2011-06-02 NOTE — Pre-Procedure Instructions (Signed)
To come for BMET 

## 2011-06-03 ENCOUNTER — Encounter (HOSPITAL_BASED_OUTPATIENT_CLINIC_OR_DEPARTMENT_OTHER): Payer: Self-pay | Admitting: *Deleted

## 2011-06-03 ENCOUNTER — Encounter (HOSPITAL_BASED_OUTPATIENT_CLINIC_OR_DEPARTMENT_OTHER)
Admission: RE | Admit: 2011-06-03 | Discharge: 2011-06-03 | Disposition: A | Discharge status: Home / Self Care | Payer: BC Managed Care – PPO | Source: Ambulatory Visit | Attending: Orthopedic Surgery | Admitting: Orthopedic Surgery

## 2011-06-03 LAB — BASIC METABOLIC PANEL
BUN: 18 mg/dL (ref 6–23)
Calcium: 9.8 mg/dL (ref 8.4–10.5)
Chloride: 97 mEq/L (ref 96–112)
Creatinine, Ser: 1.18 mg/dL — ABNORMAL HIGH (ref 0.50–1.10)
GFR calc Af Amer: 56 mL/min — ABNORMAL LOW (ref >90)
GFR calc non Af Amer: 48 mL/min — ABNORMAL LOW (ref >90)

## 2011-06-05 ENCOUNTER — Encounter (HOSPITAL_BASED_OUTPATIENT_CLINIC_OR_DEPARTMENT_OTHER): Payer: Self-pay | Admitting: Anesthesiology

## 2011-06-05 ENCOUNTER — Encounter (HOSPITAL_BASED_OUTPATIENT_CLINIC_OR_DEPARTMENT_OTHER)
Admission: RE | Disposition: A | Discharge status: Home / Self Care | Payer: Self-pay | Source: Ambulatory Visit | Attending: Orthopedic Surgery

## 2011-06-05 ENCOUNTER — Encounter (HOSPITAL_BASED_OUTPATIENT_CLINIC_OR_DEPARTMENT_OTHER): Payer: Self-pay | Admitting: Certified Registered Nurse Anesthetist

## 2011-06-05 ENCOUNTER — Ambulatory Visit (HOSPITAL_BASED_OUTPATIENT_CLINIC_OR_DEPARTMENT_OTHER)
Admission: RE | Admit: 2011-06-05 | Discharge: 2011-06-05 | Disposition: A | Discharge status: Home / Self Care | Payer: BC Managed Care – PPO | Source: Ambulatory Visit | Attending: Orthopedic Surgery | Admitting: Orthopedic Surgery

## 2011-06-05 ENCOUNTER — Encounter (HOSPITAL_BASED_OUTPATIENT_CLINIC_OR_DEPARTMENT_OTHER): Payer: Self-pay | Admitting: *Deleted

## 2011-06-05 ENCOUNTER — Ambulatory Visit (HOSPITAL_BASED_OUTPATIENT_CLINIC_OR_DEPARTMENT_OTHER): Payer: BC Managed Care – PPO | Admitting: Anesthesiology

## 2011-06-05 DIAGNOSIS — I10 Essential (primary) hypertension: Secondary | ICD-10-CM | POA: Insufficient documentation

## 2011-06-05 DIAGNOSIS — D481 Neoplasm of uncertain behavior of connective and other soft tissue: Secondary | ICD-10-CM | POA: Insufficient documentation

## 2011-06-05 DIAGNOSIS — R2231 Localized swelling, mass and lump, right upper limb: Secondary | ICD-10-CM

## 2011-06-05 DIAGNOSIS — D4819 Other specified neoplasm of uncertain behavior of connective and other soft tissue: Secondary | ICD-10-CM | POA: Insufficient documentation

## 2011-06-05 DIAGNOSIS — Z01812 Encounter for preprocedural laboratory examination: Secondary | ICD-10-CM | POA: Insufficient documentation

## 2011-06-05 HISTORY — DX: Essential (primary) hypertension: I10

## 2011-06-05 HISTORY — PX: MASS EXCISION: SHX2000

## 2011-06-05 HISTORY — DX: Dental restoration status: Z98.811

## 2011-06-05 HISTORY — DX: Dermatitis, unspecified: L30.9

## 2011-06-05 HISTORY — DX: Localized swelling, mass and lump, unspecified upper limb: R22.30

## 2011-06-05 SURGERY — EXCISION MASS
Anesthesia: Monitor Anesthesia Care | Site: Hand | Laterality: Right | Wound class: Clean

## 2011-06-05 MED ORDER — BUPIVACAINE HCL (PF) 0.25 % IJ SOLN
INTRAMUSCULAR | Status: DC | PRN
  Administered 2011-06-05: 5 mL

## 2011-06-05 MED ORDER — DEXAMETHASONE SODIUM PHOSPHATE 4 MG/ML IJ SOLN
INTRAMUSCULAR | Status: DC | PRN
  Administered 2011-06-05: 4 mg via INTRAVENOUS

## 2011-06-05 MED ORDER — LACTATED RINGERS IV SOLN
INTRAVENOUS | Status: DC
  Administered 2011-06-05 (×2): via INTRAVENOUS

## 2011-06-05 MED ORDER — HYDROCODONE-ACETAMINOPHEN 5-500 MG PO TABS: 1.0000 | ORAL_TABLET | Freq: Four times a day (QID) | ORAL | Status: AC | PRN

## 2011-06-05 MED ORDER — ONDANSETRON HCL 4 MG/2ML IJ SOLN
INTRAMUSCULAR | Status: DC | PRN
  Administered 2011-06-05: 4 mg via INTRAVENOUS

## 2011-06-05 MED ORDER — METOCLOPRAMIDE HCL 5 MG/ML IJ SOLN: 10.0000 mg | Freq: Once | INTRAMUSCULAR | Status: DC | PRN

## 2011-06-05 MED ORDER — MORPHINE SULFATE 2 MG/ML IJ SOLN: 0.0500 mg/kg | INTRAMUSCULAR | Status: DC | PRN

## 2011-06-05 MED ORDER — CHLORHEXIDINE GLUCONATE 4 % EX LIQD: 60.0000 mL | Freq: Once | CUTANEOUS | Status: DC

## 2011-06-05 MED ORDER — PROPOFOL 10 MG/ML IV EMUL
INTRAVENOUS | Status: DC | PRN
  Administered 2011-06-05: 75 ug/kg/min via INTRAVENOUS

## 2011-06-05 MED ORDER — FENTANYL CITRATE 0.05 MG/ML IJ SOLN
INTRAMUSCULAR | Status: DC | PRN
  Administered 2011-06-05: 50 ug via INTRAVENOUS

## 2011-06-05 MED ORDER — CEFAZOLIN SODIUM 1-5 GM-% IV SOLN: 1.0000 g | INTRAVENOUS | Status: DC

## 2011-06-05 MED ORDER — LIDOCAINE HCL (CARDIAC) 20 MG/ML IV SOLN
INTRAVENOUS | Status: DC | PRN
  Administered 2011-06-05: 50 mg via INTRAVENOUS

## 2011-06-05 MED ORDER — FENTANYL CITRATE 0.05 MG/ML IJ SOLN: 25.0000 ug | INTRAMUSCULAR | Status: DC | PRN

## 2011-06-05 MED ORDER — LIDOCAINE HCL (PF) 1 % IJ SOLN
INTRAMUSCULAR | Status: DC | PRN
  Administered 2011-06-05: 5 mL

## 2011-06-05 SURGICAL SUPPLY — 49 items
APL SKNCLS STERI-STRIP NONHPOA (GAUZE/BANDAGES/DRESSINGS) ×1
BANDAGE CONFORM 3  STR LF (GAUZE/BANDAGES/DRESSINGS) ×1 IMPLANT
BANDAGE ELASTIC 3 VELCRO ST LF (GAUZE/BANDAGES/DRESSINGS) ×2 IMPLANT
BANDAGE ELASTIC 4 VELCRO ST LF (GAUZE/BANDAGES/DRESSINGS) IMPLANT
BENZOIN TINCTURE PRP APPL 2/3 (GAUZE/BANDAGES/DRESSINGS) ×2 IMPLANT
BLADE SURG 15 STRL LF DISP TIS (BLADE) ×1 IMPLANT
BLADE SURG 15 STRL SS (BLADE) ×2
BNDG CMPR 9X4 STRL LF SNTH (GAUZE/BANDAGES/DRESSINGS) ×1
BNDG CMPR MD 5X2 ELC HKLP STRL (GAUZE/BANDAGES/DRESSINGS) ×1
BNDG ELASTIC 2 VLCR STRL LF (GAUZE/BANDAGES/DRESSINGS) ×2 IMPLANT
BNDG ESMARK 4X9 LF (GAUZE/BANDAGES/DRESSINGS) ×1 IMPLANT
BRUSH SCRUB EZ PLAIN DRY (MISCELLANEOUS) ×2 IMPLANT
CORDS BIPOLAR (ELECTRODE) ×2 IMPLANT
COVER MAYO STAND STRL (DRAPES) ×2 IMPLANT
COVER TABLE BACK 60X90 (DRAPES) ×2 IMPLANT
CUFF TOURNIQUET SINGLE 18IN (TOURNIQUET CUFF) ×1 IMPLANT
DECANTER SPIKE VIAL GLASS SM (MISCELLANEOUS) IMPLANT
DRAPE EXTREMITY T 121X128X90 (DRAPE) ×2 IMPLANT
DRAPE SURG 17X23 STRL (DRAPES) ×2 IMPLANT
DRSG EMULSION OIL 3X3 NADH (GAUZE/BANDAGES/DRESSINGS) ×2 IMPLANT
GLOVE BIO SURGEON STRL SZ 6.5 (GLOVE) ×4 IMPLANT
GLOVE BIOGEL PI IND STRL 8.5 (GLOVE) ×1 IMPLANT
GLOVE BIOGEL PI INDICATOR 8.5 (GLOVE) ×1
GLOVE SURG ORTHO 8.0 STRL STRW (GLOVE) ×2 IMPLANT
GOWN PREVENTION PLUS XLARGE (GOWN DISPOSABLE) ×2 IMPLANT
GOWN PREVENTION PLUS XXLARGE (GOWN DISPOSABLE) ×2 IMPLANT
NEEDLE HYPO 25X1 1.5 SAFETY (NEEDLE) ×2 IMPLANT
NS IRRIG 1000ML POUR BTL (IV SOLUTION) ×2 IMPLANT
PACK BASIN DAY SURGERY FS (CUSTOM PROCEDURE TRAY) ×2 IMPLANT
PAD CAST 3X4 CTTN HI CHSV (CAST SUPPLIES) ×1 IMPLANT
PADDING CAST ABS 3INX4YD NS (CAST SUPPLIES) ×1
PADDING CAST ABS 4INX4YD NS (CAST SUPPLIES) ×1
PADDING CAST ABS COTTON 3X4 (CAST SUPPLIES) ×1 IMPLANT
PADDING CAST ABS COTTON 4X4 ST (CAST SUPPLIES) ×1 IMPLANT
PADDING CAST COTTON 3X4 STRL (CAST SUPPLIES) ×2
SPLINT PLASTER CAST XFAST 3X15 (CAST SUPPLIES) IMPLANT
SPLINT PLASTER XTRA FASTSET 3X (CAST SUPPLIES)
SPONGE GAUZE 4X4 12PLY (GAUZE/BANDAGES/DRESSINGS) ×2 IMPLANT
STOCKINETTE 4X48 STRL (DRAPES) ×2 IMPLANT
STRIP CLOSURE SKIN 1/2X4 (GAUZE/BANDAGES/DRESSINGS) IMPLANT
SUT MNCRL AB 3-0 PS2 18 (SUTURE) ×2 IMPLANT
SUT MNCRL AB 4-0 PS2 18 (SUTURE) IMPLANT
SUT PROLENE 4 0 PS 2 18 (SUTURE) ×1 IMPLANT
SYR BULB 3OZ (MISCELLANEOUS) ×2 IMPLANT
SYR CONTROL 10ML LL (SYRINGE) ×2 IMPLANT
TOWEL OR 17X24 6PK STRL BLUE (TOWEL DISPOSABLE) ×2 IMPLANT
TRAY DSU PREP LF (CUSTOM PROCEDURE TRAY) ×2 IMPLANT
UNDERPAD 30X30 INCONTINENT (UNDERPADS AND DIAPERS) ×2 IMPLANT
WATER STERILE IRR 1000ML POUR (IV SOLUTION) ×1 IMPLANT

## 2011-06-05 NOTE — Discharge Instructions (Signed)
    Post Anesthesia Home Care Instructions  Activity: Get plenty of rest for the remainder of the day. A responsible adult should stay with you for 24 hours following the procedure.  For the next 24 hours, DO NOT: -Drive a car -Advertising copywriter -Drink alcoholic beverages -Take any medication unless instructed by your physician -Make any legal decisions or sign important papers.  Meals: Start with liquid foods such as gelatin or soup. Progress to regular foods as tolerated. Avoid greasy, spicy, heavy foods. If nausea and/or vomiting occur, drink only clear liquids until the nausea and/or vomiting subsides. Call your physician if vomiting continues.  Special Instructions/Symptoms: Your throat may feel dry or sore from the anesthesia or the breathing tube placed in your throat during surgery. If this causes discomfort, gargle with warm salt water. The discomfort should disappear within 24 hours.     KEEP BANDAGE CLEAN AND DRY CALL OFFICE FOR F/U APPT [phone] in 11 days KEEP HAND ELEVATED ABOVE HEART OK TO APPLY ICE TO OPERATIVE AREA CONTACT OFFICE IF ANY WORSENING PAIN OR CONCERNS.

## 2011-06-05 NOTE — H&P (Signed)
Kayla Stokes is an 66 y.o. female.   Chief Complaint: right hand mass HPI: symptomatic mass right hand thumb region Pt elects for removal Pt has been followed in office  Past Medical History  Diagnosis Date  . High cholesterol   . Arthritis     finger  . Eczema     elbow  . Hypertension     under control, has been on med. x 10 yrs.  . Dental crowns present   . Mass of hand 05/2011    right    Past Surgical History  Procedure Date  . Varicose vein surgery 02/13/2003    ligation and stripping left greater saphenous; exc.  multiple varicosities  . Vaginal hysterectomy with a&p repair 1989  . Cataract extraction w/ intraocular lens  implant, bilateral 09/2010, 01/2011  . Knee arthroscopy 02/12/2011    Procedure: ARTHROSCOPY KNEE;  Surgeon: Loanne Drilling;  Location: [facility];  Service: Orthopedics;  Laterality: Left;  . Meniscus debridement 02/12/2011    Procedure: DEBRIDEMENT OF MENISCUS;  Surgeon: Loanne Drilling;  Location: [facility];  Service: Orthopedics;  Laterality: Left;  . Chondroplasty 02/12/2011    Procedure: CHONDROPLASTY;  Surgeon: Loanne Drilling;  Location: [facility];  Service: Orthopedics;  Laterality: Left;  . Tubal ligation     Family History  Problem Relation Age of Onset  . Diabetes Mother   . Hypertension Mother   . Heart disease Mother   . Hypertension Father   . Cancer Sister     CERVICAL  . Heart disease Brother   . Cancer Brother     LUNG  . Throat cancer Brother    Social History:  reports that she has never smoked. She has never used smokeless tobacco. She reports that she drinks alcohol. She reports that she does not use illicit drugs.  Allergies:  Allergies  Allergen Reactions  . Macrobid Other (See Comments)    SEVERE STOMACH PAIN    Medications Prior to Admission  Medication Dose Route Frequency Provider Last Rate Last Dose  . ceFAZolin (ANCEF) IVPB 1 g/50 mL premix  1 g  Intravenous 60 min Pre-Op Sharma Covert, MD      . chlorhexidine (HIBICLENS) 4 % liquid 4 application  60 mL Topical Once Sharma Covert, MD      . lactated ringers infusion   Intravenous Continuous Germaine Pomfret, MD 20 mL/hr at 06/05/11 1109     Medications Prior to Admission  Medication Sig Dispense Refill  . aspirin 81 MG tablet Take 81 mg by mouth daily.       . cholecalciferol (VITAMIN D) 400 UNITS TABS Take by mouth.      . estrogens, conjugated, (PREMARIN) 0.3 MG tablet 0.3 mg daily. PM      . Flaxseed, Linseed, (FLAXSEED OIL PO) Take 1 capsule by mouth daily.       . Magnesium 500 MG TABS Take 1 tablet by mouth daily.       . Multiple Vitamin (MULTIVITAMIN) tablet Take 1 tablet by mouth every morning.       . rosuvastatin (CRESTOR) 10 MG tablet Take 5 mg by mouth at bedtime.       . triamterene-hydrochlorothiazide (DYAZIDE) 37.5-25 MG per capsule Take 1 capsule by mouth 2 (two) times daily.       Marland Kitchen ALPRAZolam (XANAX) 0.25 MG tablet Take 0.25 mg by mouth at bedtime as needed. For anxiety.  No results found for this or any previous visit (from the past 48 hour(s)). No results found.  No recent illnesses or hospitalizations  Blood pressure 111/78, pulse 76, temperature 98.6 F (37 C), temperature source Oral, resp. rate 20, height 5\' 4"  (1.626 m), weight 77.111 kg (170 lb), SpO2 96.00%. General Appearance:  Alert, cooperative, no distress, appears stated age  Head:  Normocephalic, without obvious abnormality, atraumatic  Eyes:  Pupils equal, conjunctiva/corneas clear,         Throat: Lips, mucosa, and tongue normal; teeth and gums normal  Neck: No visible masses     Lungs:   respirations unlabored  Chest Wall:  No tenderness or deformity  Heart:  Regular rate and rhythm,  Abdomen:   Soft, non-tender,         Extremities: Right thumb: volar mass over mp crease, bluish hue to mass Good thumb mobility Fingers warm well perfused Good digital motion  Pulses: 2+  and symmetric  Skin: Skin color, texture, turgor normal, no rashes or lesions     Neurologic: Normal   Assessment/Plan Right thumb mass  Right thumb/hand mass removal  R/B/A DISCUSSED WITH PT IN OFFICE.  PT VOICED UNDERSTANDING OF PLAN CONSENT SIGNED DAY OF SURGERY PT SEEN AND EXAMINED PRIOR TO OPERATIVE PROCEDURE/DAY OF SURGERY SITE MARKED. QUESTIONS ANSWERED WILL GO HOME FOLLOWING SURGERY  Sharma Covert 06/05/2011, 1:00 PM

## 2011-06-05 NOTE — Anesthesia Preprocedure Evaluation (Signed)
Anesthesia Evaluation  Patient identified by MRN, date of birth, ID band Patient awake    Reviewed: Allergy & Precautions, H&P , NPO status , Patient's Chart, lab work & pertinent test results, reviewed documented beta blocker date and time   Airway Mallampati: II TM Distance: >3 FB Neck ROM: full    Dental   Pulmonary neg pulmonary ROS,          Cardiovascular hypertension, On Medications     Neuro/Psych negative neurological ROS  negative psych ROS   GI/Hepatic negative GI ROS, Neg liver ROS,   Endo/Other  negative endocrine ROS  Renal/GU negative Renal ROS  negative genitourinary   Musculoskeletal   Abdominal   Peds  Hematology negative hematology ROS (+)   Anesthesia Other Findings See surgeon's H&P   Reproductive/Obstetrics negative OB ROS                           Anesthesia Physical Anesthesia Plan  ASA: II  Anesthesia Plan: MAC   Post-op Pain Management:    Induction: Intravenous  Airway Management Planned: Simple Face Mask  Additional Equipment:   Intra-op Plan:   Post-operative Plan:   Informed Consent: I have reviewed the patients History and Physical, chart, labs and discussed the procedure including the risks, benefits and alternatives for the proposed anesthesia with the patient or authorized representative who has indicated his/her understanding and acceptance.     Plan Discussed with: CRNA and Surgeon  Anesthesia Plan Comments:         Anesthesia Quick Evaluation  

## 2011-06-05 NOTE — Anesthesia Postprocedure Evaluation (Signed)
Anesthesia Post Note  Patient: Kayla Stokes  Procedure(s) Performed: Procedure(s) (LRB): EXCISION MASS (Right)  Anesthesia type: General  Patient location: PACU  Post pain: Pain level controlled  Post assessment: Patient's Cardiovascular Status Stable  Last Vitals:  Filed Vitals:   06/05/11 1351  BP: 100/55  Pulse: 70  Temp:   Resp: 15    Post vital signs: Reviewed and stable  Level of consciousness: alert  Complications: No apparent anesthesia complications

## 2011-06-05 NOTE — Anesthesia Procedure Notes (Signed)
Procedure Name: MAC Date/Time: 06/05/2011 1:07 PM Performed by: Ryshawn Sanzone D Pre-anesthesia Checklist: Patient identified, Emergency Drugs available, Suction available, Patient being monitored and Timeout performed Patient Re-evaluated:Patient Re-evaluated prior to inductionOxygen Delivery Method: Simple face mask

## 2011-06-05 NOTE — Transfer of Care (Signed)
Immediate Anesthesia Transfer of Care Note  Patient: Kayla Stokes  Procedure(s) Performed: Procedure(s) (LRB): EXCISION MASS (Right)  Patient Location: PACU  Anesthesia Type: MAC  Level of Consciousness: awake, alert , oriented and patient cooperative  Airway & Oxygen Therapy: Patient Spontanous Breathing  Post-op Assessment: Report given to PACU RN and Post -op Vital signs reviewed and stable  Post vital signs: Reviewed and stable  Complications: No apparent anesthesia complications

## 2011-06-05 NOTE — Brief Op Note (Signed)
06/05/2011  1:49 PM  PATIENT:  Johnette Abraham  66 y.o. female  PRE-OPERATIVE DIAGNOSIS:  right thumb mass  POST-OPERATIVE DIAGNOSIS:  right thumb mass  PROCEDURE:  Procedure(s) (LRB): EXCISION MASS (Right)  SURGEON:  Surgeon(s) and Role:    * Sharma Covert, MD - Primary  PHYSICIAN ASSISTANT:   ASSISTANTS: none   ANESTHESIA:   none  EBL:  Total I/O In: 800 [I.V.:800] Out: -   BLOOD ADMINISTERED:none  DRAINS: none   LOCAL MEDICATIONS USED:  MARCAINE     SPECIMEN:  No Specimen  DISPOSITION OF SPECIMEN:  N/A  COUNTS:  YES  TOURNIQUET:   Total Tourniquet Time Documented: Upper Arm (Right) - 4 minutes  DICTATION: .Other Dictation: Dictation Number [id number]  PLAN OF CARE: Discharge to home after PACU  PATIENT DISPOSITION:  PACU - hemodynamically stable.   Delay start of Pharmacological VTE agent (>24hrs) due to surgical blood loss or risk of bleeding: not applicable

## 2011-06-06 ENCOUNTER — Encounter (HOSPITAL_BASED_OUTPATIENT_CLINIC_OR_DEPARTMENT_OTHER): Payer: Self-pay | Admitting: Orthopedic Surgery

## 2011-06-06 NOTE — Op Note (Signed)
[name], Kayla Stokes NO.:  [id number]  MEDICAL RECORD NO.:  [id number]  LOCATION:  [facility]04                         FACILITY:  [facility]  PHYSICIAN:  Madelynn Done, MD  DATE OF BIRTH:  [birth year]  DATE OF PROCEDURE:  06/05/2011 DATE OF DISCHARGE:  02/16/2011                              OPERATIVE REPORT   PREOPERATIVE DIAGNOSIS:  Right thumb deep mass.  POSTOPERATIVE DIAGNOSIS:  Right thumb deep mass.  ATTENDING PHYSICIAN:  Sharma Covert IV, MD, who scrubbed and present for the entire procedure.  ASSISTANT SURGEON:  None.  SURGICAL PROCEDURE:  Excision of right thumb deep mass, less than 0.5 cm.  ANESTHESIA:  1% Xylocaine, 0.25% Marcaine local block with IV sedation.  SURGICAL INDICATIONS:  [name] is a right-hand-dominant female with enlarging mass of the right thumb.  The patient elected to undergo the above procedure.  Risks, benefits, and alternatives were discussed in detail with the patient and signed informed consent was obtained.  Risks include but not limited to bleeding, infection, damage to nearby nerves, arteries, or tendons, recurrence of the mass, and need for further surgical intervention.  DESCRIPTION OF PROCEDURE:  The patient was properly identified in the preoperative holding area and a mark with a permanent marker made on the right thumb region to indicate correct operative site.  The patient was then brought back to operating room and placed supine on the anesthesia room table.  Local anesthetic was administered after the IV sedation was administered.  Well-padded tourniquet was then placed in the right brachium and sealed with 1000 drape.  The right upper extremity was then prepped and draped in normal sterile fashion.  Time-out was called, correct side was identified, and procedure then begun.  Attention then turned to the right thumb, where a transverse incision made directly over the mass, and the limb was then elevated using  Esmarch exsanguination with tourniquet insufflated.  Dissection was then carried down through the skin and subcutaneous tissue.  Deep subcutaneous tissue right next to the ulnar neurovascular bundle to the thumb of the mass was encountered.  Once dissection was carried around circumferentially around the mass, the mass was then excised.  The wound was then thoroughly irrigated.  The mass was less 0.5 cm, but it was along the ulnar neurovascular bundle.  The wound was then irrigated.  Tourniquet deflated.  Hemostasis was obtained.  The skin closed using horizontal mattress Prolene sutures.  Adaptic dressing and sterile compressive bandage then applied.  The patient tolerated the procedure well, returned to recovery room in good condition.  POSTOPERATIVE PLAN:  The patient will be discharged home.  Will be seen back in the office in approximately 11 days for wound check, suture removal, and go over the deep mass pathology.     Madelynn Done, MD     FWO/MEDQ  D:  06/05/2011  T:  06/06/2011  Job:  [id number]

## 2011-07-02 ENCOUNTER — Other Ambulatory Visit: Payer: Self-pay | Admitting: *Deleted

## 2011-07-02 DIAGNOSIS — I83893 Varicose veins of bilateral lower extremities with other complications: Secondary | ICD-10-CM

## 2011-07-25 ENCOUNTER — Encounter: Payer: Self-pay | Admitting: Vascular Surgery

## 2011-07-28 ENCOUNTER — Encounter: Payer: Self-pay | Admitting: Vascular Surgery

## 2011-07-28 ENCOUNTER — Ambulatory Visit (INDEPENDENT_AMBULATORY_CARE_PROVIDER_SITE_OTHER): Payer: BC Managed Care – PPO | Admitting: Vascular Surgery

## 2011-07-28 ENCOUNTER — Encounter (INDEPENDENT_AMBULATORY_CARE_PROVIDER_SITE_OTHER): Payer: BC Managed Care – PPO | Admitting: *Deleted

## 2011-07-28 VITALS — BP 145/79 | HR 97 | Resp 20 | Ht 64.0 in | Wt 169.0 lb

## 2011-07-28 DIAGNOSIS — I83893 Varicose veins of bilateral lower extremities with other complications: Secondary | ICD-10-CM | POA: Insufficient documentation

## 2011-07-28 NOTE — Progress Notes (Signed)
Subjective:     Patient ID: Kayla Stokes, female   DOB: [birth year], 66 y.o.   MRN: [id number]  HPI this 66 year old female underwent ligation and stripping of the left great saphenous vein from the ankle to the knee by me in 2002 4 bulging varicosities which are quite symptomatic. She had an excellent result. She did have a mild amount of saphenous neuritis following the procedure which has resolved. Over the last 12 months she has developed progressive worsening edema of the left leg with swelling in the left ankle and foot as the day progresses. She has a heavy and achy throbbing discomfort in the leg as the day progresses. She has tried short leg elastic stockings which are not relieving her symptoms. She is not worn long-leg stockings. She does elevate her legs occasionally but does not take ibuprofen on a regular basis. She has no history of DVT, thrombophlebitis, stasis ulcers, bleeding, or other complications. Her symptoms continued to worsen. She is taking a trip on a flight to [place] in the near future and is concerned about her leg.  Past Medical History  Diagnosis Date  . High cholesterol   . Arthritis     finger  . Eczema     elbow  . Hypertension     under control, has been on med. x 10 yrs.  . Dental crowns present   . Mass of hand 05/2011    right    History  Substance Use Topics  . Smoking status: Never Smoker   . Smokeless tobacco: Never Used  . Alcohol Use: 0.0 oz/week     occasionally    Family History  Problem Relation Age of Onset  . Diabetes Mother   . Hypertension Mother   . Heart disease Mother   . Hypertension Father   . Cancer Sister     CERVICAL  . Heart disease Brother   . Cancer Brother     LUNG  . Throat cancer Brother     Allergies  Allergen Reactions  . Nitrofurantoin Monohyd Macro Other (See Comments)    SEVERE STOMACH PAIN    Current outpatient prescriptions:ALPRAZolam (XANAX) 0.25 MG tablet, Take 0.25 mg by mouth at bedtime as needed.  For anxiety., Disp: , Rfl: ;  aspirin 81 MG tablet, Take 81 mg by mouth daily. , Disp: , Rfl: ;  cholecalciferol (VITAMIN D) 400 UNITS TABS, Take by mouth., Disp: , Rfl: ;  estrogens, conjugated, (PREMARIN) 0.3 MG tablet, 0.3 mg daily. PM, Disp: , Rfl: ;  Flaxseed, Linseed, (FLAXSEED OIL PO), Take 1 capsule by mouth daily. , Disp: , Rfl:  Magnesium 500 MG TABS, Take 1 tablet by mouth daily. , Disp: , Rfl: ;  Multiple Vitamin (MULTIVITAMIN) tablet, Take 1 tablet by mouth every morning. , Disp: , Rfl: ;  rosuvastatin (CRESTOR) 10 MG tablet, Take 5 mg by mouth at bedtime. , Disp: , Rfl: ;  triamterene-hydrochlorothiazide (DYAZIDE) 37.5-25 MG per capsule, Take 1 capsule by mouth 2 (two) times daily. , Disp: , Rfl:   BP 145/79  Pulse 97  Resp 20  Ht 5\' 4"  (1.626 m)  Wt 169 lb (76.658 kg)  BMI 29.01 kg/m2  Body mass index is 29.01 kg/(m^2).          Review of Systems denies chest pain, dyspnea on exertion, PND, orthopnea, hemoptysis, claudication, any neurologic symptoms such as TIAs. All systems are negative and a complete review of systems    Objective:   Physical Exam blood  pressure 145/79 heart rate 97 respirations 20 Gen.-alert and oriented x3 in no apparent distress HEENT normal for age Lungs no rhonchi or wheezing Cardiovascular regular rhythm no murmurs carotid pulses 3+ palpable no bruits audible Abdomen soft nontender no palpable masses Musculoskeletal free of  major deformities Skin clear -no rashes Neurologic normal Lower extremities 3+ femoral and dorsalis pedis pulses palpable bilaterally with 1+ edema in the left calf ankle and foot no edema on the right side. She has some mild hyperpigmentation in the lower third of the left leg. There are no active ulcers. No bulging varicosities are noted. She does have some small reticular veins in the medial aspect of the left leg from ankle to the knee. Right leg is free of varicosities.  Today I ordered a venous duplex exam of the  left leg which are reviewed and interpreted. There is no DVT in the deep system is normal. There is gross reflux in the left great saphenous vein from the saphenofemoral junction to the knee where the vein was previously removed distally.  I personally visualized the left great saphenous vein with the SonoSite and agree that there is gross reflux.     Assessment:     Severe venous insufficiency left leg from saphenofemoral junction to knee with pain and swelling-affecting patient's daily living    Plan:     #1 long-leg elastic compression stockings 20-30 mm gradient #2 elevate legs as much is feasible during the day #3 ibuprofen on regular basis #4 return in 3 months-if no significant improvement in her symptomatology bleeding she would benefit from laser ablation left great saphenous vein

## 2011-08-04 NOTE — Procedures (Unsigned)
LOWER EXTREMITY VENOUS REFLUX EXAM  INDICATION:  Varicose veins with other complication.  EXAM:  Using color-flow imaging and pulse Doppler spectral analysis, the left common femoral, femoral, popliteal, posterior tibial, great and small saphenous veins were evaluated.  There is no evidence suggesting deep venous insufficiency in the left lower extremity.  The left saphenofemoral junction and nontortuous left great saphenous vein demonstrate reflux of > 500 milliseconds with caliper measurements as described below.  The left proximal small saphenous vein demonstrates competency.  GSV Diameter (used if found to be incompetent only)                                           Right    Left Proximal Greater Saphenous Vein           cm       0.49 cm Proximal-to-mid-thigh                     cm       0.47 cm Mid thigh                                 cm       cm Mid-distal thigh                          cm       cm Distal thigh                              cm       0.44 cm Knee                                      cm       0.42 cm  IMPRESSION: 1. Left great saphenous vein reflux is noted, as described above. 2. The left deep venous system and short saphenous veins are     competent.  ___________________________________________ Quita Skye. Hart Rochester, M.D.  CH/MEDQ  D:  07/28/2011  T:  07/28/2011  Job:  [id number]

## 2011-08-14 ENCOUNTER — Encounter: Payer: Self-pay | Admitting: *Deleted

## 2011-08-20 ENCOUNTER — Other Ambulatory Visit: Payer: Self-pay | Admitting: *Deleted

## 2011-08-20 MED ORDER — TRIAMTERENE-HCTZ 37.5-25 MG PO CAPS: 1.0000 | ORAL_CAPSULE | Freq: Two times a day (BID) | ORAL | Status: DC

## 2011-09-24 ENCOUNTER — Encounter: Payer: Self-pay | Admitting: Obstetrics and Gynecology

## 2011-09-24 ENCOUNTER — Ambulatory Visit (INDEPENDENT_AMBULATORY_CARE_PROVIDER_SITE_OTHER): Payer: BC Managed Care – PPO | Admitting: Obstetrics and Gynecology

## 2011-09-24 VITALS — BP 130/80 | Ht 63.0 in | Wt 169.0 lb

## 2011-09-24 DIAGNOSIS — A048 Other specified bacterial intestinal infections: Secondary | ICD-10-CM | POA: Insufficient documentation

## 2011-09-24 DIAGNOSIS — Z01419 Encounter for gynecological examination (general) (routine) without abnormal findings: Secondary | ICD-10-CM

## 2011-09-24 DIAGNOSIS — K859 Acute pancreatitis without necrosis or infection, unspecified: Secondary | ICD-10-CM | POA: Insufficient documentation

## 2011-09-24 MED ORDER — ESTROGENS CONJUGATED 0.3 MG PO TABS: 0.3000 mg | ORAL_TABLET | Freq: Every day | ORAL | Status: DC

## 2011-09-24 MED ORDER — TRIAMTERENE-HCTZ 37.5-25 MG PO CAPS: ORAL_CAPSULE | ORAL | Status: DC

## 2011-09-24 NOTE — Progress Notes (Signed)
Patient came to see me today for her annual GYN exam. She remains on 0.3 mg of Premarin with excellent results. If she misses several pills she gets very symptomatic. She also uses a daily fluid pill with excellent results. She is scheduled for yearly mammogram. She is having no vaginal bleeding. She is having no pelvic pain. She has never had an abnormal Pap smear. She is status post hysterectomy for uterine prolapse. Her last Pap smear was 2012. She has had 2 normal bone densities the last one in 2009. She is not having dyspareunia. She does her lab work through her PCP. She was hospitalized this year with pancreatitis. She is made uneventful recovery.  HEENT: Within normal limits. Kennon Portela present. Neck: No masses. Supraclavicular lymph nodes: Not enlarged. Breasts: Examined in both sitting and lying position. Symmetrical without skin changes or masses. Abdomen: Soft no masses guarding or rebound. No hernias. Pelvic: External within normal limits. BUS within normal limits. Vaginal examination shows good estrogen effect, no cystocele enterocele or rectocele. Cervix and uterus absent. Adnexa within normal limits. Rectovaginal confirmatory. Extremities within normal limits.  Assessment: Menopausal symptoms. Fluid retention.  Plan: Continue Premarin. Continue Dyazide. Mammogram.The new Pap smear guidelines were discussed with the patient. No pap done.

## 2011-09-25 LAB — URINALYSIS W MICROSCOPIC + REFLEX CULTURE
Bilirubin Urine: NEGATIVE
Crystals: NONE SEEN
Glucose, UA: NEGATIVE mg/dL
Ketones, ur: NEGATIVE mg/dL
Protein, ur: NEGATIVE mg/dL
Squamous Epithelial / LPF: NONE SEEN

## 2011-10-24 ENCOUNTER — Encounter: Payer: Self-pay | Admitting: Vascular Surgery

## 2011-10-26 HISTORY — PX: ABLATION SAPHENOUS VEIN W/ RFA: SUR11

## 2011-10-28 ENCOUNTER — Encounter: Payer: Self-pay | Admitting: Vascular Surgery

## 2011-10-28 ENCOUNTER — Ambulatory Visit (INDEPENDENT_AMBULATORY_CARE_PROVIDER_SITE_OTHER): Payer: BC Managed Care – PPO | Admitting: Vascular Surgery

## 2011-10-28 VITALS — BP 122/82 | HR 76 | Resp 16 | Ht 64.0 in | Wt 167.0 lb

## 2011-10-28 DIAGNOSIS — I83893 Varicose veins of bilateral lower extremities with other complications: Secondary | ICD-10-CM

## 2011-10-28 NOTE — Progress Notes (Signed)
Subjective:     Patient ID: Kayla Stokes, female   DOB: 1945/04/30, 66 y.o.   MRN: [id number]  HPI this 66 year old female returns for continued followup regarding her venous insufficiency of the left leg. She previously underwent ligation and stripping of the left great saphenous vein from the ankle to the knee by me in 2002 to treat bulging varicosities. She did well from that standpoint. She now continues to have severe swelling which is worsened over the past few years. This causes aching throbbing and burning discomfort. She has been having no success wearing a long leg elastic compression stockings 20-30 mm gradient in relieving her symptomatology. She is also trying elevation and ibuprofen.  Past Medical History  Diagnosis Date  . High cholesterol   . Arthritis     finger  . Eczema     elbow  . Hypertension     under control, has been on med. x 10 yrs.  . Dental crowns present   . Mass of hand 05/2011    right  . Pancreatitis   . H. pylori infection     History  Substance Use Topics  . Smoking status: Never Smoker   . Smokeless tobacco: Never Used  . Alcohol Use: 0.0 oz/week     occasionally-Rare    Family History  Problem Relation Age of Onset  . Diabetes Mother   . Hypertension Mother   . Heart disease Mother   . Hypertension Father   . Cancer Sister     CERVICAL  . Heart disease Brother   . Cancer Brother     LUNG  . Throat cancer Brother     Allergies  Allergen Reactions  . Nitrofurantoin Monohyd Macro Other (See Comments)    SEVERE STOMACH PAIN    Current outpatient prescriptions:ALPRAZolam (XANAX) 0.25 MG tablet, Take 0.25 mg by mouth at bedtime as needed. For anxiety., Disp: , Rfl: ;  aspirin 81 MG tablet, Take 81 mg by mouth daily. , Disp: , Rfl: ;  cholecalciferol (VITAMIN D) 400 UNITS TABS, Take by mouth., Disp: , Rfl: ;  estrogens, conjugated, (PREMARIN) 0.3 MG tablet, Take 1 tablet (0.3 mg total) by mouth daily. PM, Disp: 30 tablet, Rfl:  12 Flaxseed, Linseed, (FLAXSEED OIL PO), Take 1 capsule by mouth daily. , Disp: , Rfl: ;  glucosamine-chondroitin 500-400 MG tablet, Take 1 tablet by mouth 3 (three) times daily., Disp: , Rfl: ;  Magnesium 500 MG TABS, Take 1 tablet by mouth daily. , Disp: , Rfl: ;  Multiple Vitamin (MULTIVITAMIN) tablet, Take 1 tablet by mouth every morning. , Disp: , Rfl: ;  rosuvastatin (CRESTOR) 10 MG tablet, Take 5 mg by mouth at bedtime. , Disp: , Rfl:  triamterene-hydrochlorothiazide (DYAZIDE) 37.5-25 MG per capsule, One daily, Disp: 30 capsule, Rfl: 12  BP 122/82  Pulse 76  Resp 16  Ht 5\' 4"  (1.626 m)  Wt 167 lb (75.751 kg)  BMI 28.67 kg/m2  Body mass index is 28.67 kg/(m^2).           Review of Systems denies chest pain, dyspnea on exertion, PND, orthopnea, hemoptysis, claudication     Objective:   Physical Exam blood pressure 122/82 heart rate 76 respirations 16 General well-developed well-nourished female in no apparent stress alert and oriented x3 Lungs no rhonchi or wheezing Cardiovascular regular rhythm no murmurs Lower extremity left leg with 1-2+ edema. 3+ dorsalis pedis pulse palpable. No bulging varicosities are noted.  Patient has a documented gross reflux in left  great saphenous vein from saphenofemoral junction to knee where previous stripping was terminated. She has no evidence of DVT or deep reflux    Assessment:     Severe venous insufficiency with gross reflux left great saphenous vein from saphenofemoral junction to knee causing chronic swelling and pain-resistant to conservative measures i.e. long-leg elastic compression stockings 20-30 mm gradient and elevation and ibuprofen This is definitely affecting patient's daily living    Plan:     Patient needs a laser ablation left great saphenous vein-will proceed with precertification to perform this in near future

## 2011-10-29 ENCOUNTER — Other Ambulatory Visit: Payer: Self-pay | Admitting: *Deleted

## 2011-10-29 DIAGNOSIS — I83893 Varicose veins of bilateral lower extremities with other complications: Secondary | ICD-10-CM

## 2011-11-14 ENCOUNTER — Encounter: Payer: Self-pay | Admitting: Vascular Surgery

## 2011-11-17 ENCOUNTER — Encounter: Payer: Self-pay | Admitting: Vascular Surgery

## 2011-11-17 ENCOUNTER — Ambulatory Visit (INDEPENDENT_AMBULATORY_CARE_PROVIDER_SITE_OTHER): Payer: BC Managed Care – PPO | Admitting: Vascular Surgery

## 2011-11-17 VITALS — BP 152/74 | HR 79 | Resp 16 | Ht 64.0 in | Wt 169.0 lb

## 2011-11-17 DIAGNOSIS — I83893 Varicose veins of bilateral lower extremities with other complications: Secondary | ICD-10-CM

## 2011-11-17 NOTE — Progress Notes (Signed)
Subjective:     Patient ID: Kayla Stokes, female   DOB: Nov 18, 1945, 66 y.o.   MRN: [id number]  HPI this 66 year old female had laser ablation of the left great saphenous vein performed for gross reflux and venous hypertension. This was performed under local tumescent anesthesia and the patient tolerated the procedure well  Review of Systems     Objective:   Physical ExamBP 152/74  Pulse 79  Resp 16  Ht 5\' 4"  (1.626 m)  Wt 169 lb (76.658 kg)  BMI 29.01 kg/m2       Assessment:     Well-tolerated laser ablation left great saphenous vein performed venous hypertension under local tumescent anesthesia    Plan:     Return October 1 for venous duplex exam left leg to confirm closure left great saphenous vein

## 2011-11-17 NOTE — Progress Notes (Signed)
Laser Ablation Procedure      Date: 11/17/2011    Kayla Stokes DOB:[birth year]  Consent signed: Yes  Surgeon:J.D. Hart [place]  Procedure: Laser Ablation: left Greater Saphenous Vein  BP 152/74  Pulse 79  Resp 16  Ht 5\' 4"  (1.626 m)  Wt 169 lb (76.658 kg)  BMI 29.01 kg/m2  Start time: 1:00   End time: 1:40  Tumescent Anesthesia: 200 cc 0.9% NaCl with 50 cc Lidocaine HCL with 1% Epi and 15 cc 8.4% NaHCO3  Local Anesthesia: 5 cc Lidocaine HCL and NaHCO3 (ratio 2:1)  Pulsed mode: Watts 15 Seconds 1 Pulses:1 Total Pulses:102 Total Energy: 1528 Total Time: 1:41     Patient tolerated procedure well: Yes  Notes:  Description of Procedure:  After marking the course of the saphenous vein and the secondary varicosities in the standing position, the patient was placed on the operating table in the supine position, and the left leg was prepped and draped in sterile fashion. Local anesthetic was administered, and under ultrasound guidance the saphenous vein was accessed with a micro needle and guide wire; then the micro puncture sheath was placed. A guide wire was inserted to the saphenofemoral junction, followed by a 5 french sheath.  The position of the sheath and then the laser fiber below the junction was confirmed using the ultrasound and visualization of the aiming beam.  Tumescent anesthesia was administered along the course of the saphenous vein using ultrasound guidance. Protective laser glasses were placed on the patient, and the laser was fired at 15 watt pulsed mode advancing 1-2 mm per sec.  For a total of 1528 joules.  A steri strip was applied to the puncture site.    ABD pads and thigh high compression stockings were applied.  Ace wrap bandages were applied over the phlebectomy sites and at the top of the saphenofemoral junction.  Blood loss was less than 15 cc.  The patient ambulated out of the operating room having tolerated the procedure well.

## 2011-11-18 ENCOUNTER — Telehealth: Payer: Self-pay | Admitting: *Deleted

## 2011-11-18 NOTE — Telephone Encounter (Signed)
Patient doing well post laser ablation. No discomfort. Following all instructions. Reminded her of her fu appt next week.

## 2011-11-24 ENCOUNTER — Encounter: Payer: Self-pay | Admitting: Vascular Surgery

## 2011-11-25 ENCOUNTER — Encounter: Payer: Self-pay | Admitting: Vascular Surgery

## 2011-11-25 ENCOUNTER — Encounter (INDEPENDENT_AMBULATORY_CARE_PROVIDER_SITE_OTHER): Payer: BC Managed Care – PPO | Admitting: *Deleted

## 2011-11-25 ENCOUNTER — Ambulatory Visit (INDEPENDENT_AMBULATORY_CARE_PROVIDER_SITE_OTHER): Payer: BC Managed Care – PPO | Admitting: Vascular Surgery

## 2011-11-25 VITALS — BP 142/82 | HR 86 | Resp 18 | Ht 64.0 in | Wt 169.0 lb

## 2011-11-25 DIAGNOSIS — Z48812 Encounter for surgical aftercare following surgery on the circulatory system: Secondary | ICD-10-CM

## 2011-11-25 DIAGNOSIS — I83893 Varicose veins of bilateral lower extremities with other complications: Secondary | ICD-10-CM

## 2011-11-25 NOTE — Progress Notes (Signed)
Subjective:     Patient ID: Kayla Stokes, female   DOB: [birth year], 67 y.o.   MRN: [id number]  HPI this 66 year old female returns 1 week post laser ablation of left great saphenous vein from the knee the saphenofemoral junction for gross reflux and venous she had previously had stripping of her distal great saphenous vein from the ankle to the knee by me many years ago. She has had some mild to moderate discomfort in the medial thigh since her procedure last week. She has worn elastic compression stockings and taken ibuprofen as instructed. She is also elevating her legs. She has had no chest pain, dyspnea on exertion, PND, orthopnea, or hemoptysis.  Past Medical History  Diagnosis Date  . High cholesterol   . Arthritis     finger  . Eczema     elbow  . Hypertension     under control, has been on med. x 10 yrs.  . Dental crowns present   . Mass of hand 05/2011    right  . Pancreatitis   . H. pylori infection     History  Substance Use Topics  . Smoking status: Never Smoker   . Smokeless tobacco: Never Used  . Alcohol Use: 0.0 oz/week     occasionally-Rare    Family History  Problem Relation Age of Onset  . Diabetes Mother   . Hypertension Mother   . Heart disease Mother   . Hypertension Father   . Cancer Sister     CERVICAL  . Heart disease Brother   . Cancer Brother     LUNG  . Throat cancer Brother     Allergies  Allergen Reactions  . Nitrofurantoin Monohyd Macro Other (See Comments)    SEVERE STOMACH PAIN    Current outpatient prescriptions:ALPRAZolam (XANAX) 0.25 MG tablet, Take 0.25 mg by mouth at bedtime as needed. For anxiety., Disp: , Rfl: ;  aspirin 81 MG tablet, Take 81 mg by mouth daily. , Disp: , Rfl: ;  cholecalciferol (VITAMIN D) 400 UNITS TABS, Take by mouth., Disp: , Rfl: ;  estrogens, conjugated, (PREMARIN) 0.3 MG tablet, Take 1 tablet (0.3 mg total) by mouth daily. PM, Disp: 30 tablet, Rfl: 12 Flaxseed, Linseed, (FLAXSEED OIL PO), Take 1 capsule by  mouth daily. , Disp: , Rfl: ;  glucosamine-chondroitin 500-400 MG tablet, Take 1 tablet by mouth 3 (three) times daily., Disp: , Rfl: ;  Magnesium 500 MG TABS, Take 1 tablet by mouth daily. , Disp: , Rfl: ;  Multiple Vitamin (MULTIVITAMIN) tablet, Take 1 tablet by mouth every morning. , Disp: , Rfl: ;  rosuvastatin (CRESTOR) 10 MG tablet, Take 5 mg by mouth at bedtime. , Disp: , Rfl:  triamterene-hydrochlorothiazide (DYAZIDE) 37.5-25 MG per capsule, One daily, Disp: 30 capsule, Rfl: 12  BP 142/82  Pulse 86  Resp 18  Ht 5\' 4"  (1.626 m)  Wt 169 lb (76.658 kg)  BMI 29.01 kg/m2  Body mass index is 29.01 kg/(m^2).           Review of Systems see history of present illness     Objective:   Physical Exam blood pressure 142/82 heart rate 86 respirations 18 General well-developed well-nourished female in no apparent stress alert and oriented x3 Lungs no rhonchi or wheezing Cardiovascular regular and no murmurs carotid pulses 3+ no bruits audible Left leg with moderate ecchymosis from the saphenofemoral junction to the distal thigh with mild tenderness. No distal edema noted. 3+ dorsalis pedis pulse palpable.  Today  I ordered a venous duplex exam of the left leg which are reviewed and interpreted. The great saphenous vein on the left is totally closed up to a 0.1 0.2 cm from the saphenofemoral junction. There is no DVT.    Assessment:     Successful laser ablation left great saphenous vein for venous hypertension and pain    Plan:     Wear elastic compression stockings for one more week and then resume normal exercise activities and return to see [place] on when necessary basis

## 2011-11-29 IMAGING — CR DG ABDOMEN ACUTE W/ 1V CHEST
5 series · 5 of 5 positions shown · non-contrast
Comparison: None.

CLINICAL DATA: Epigastric pain radiating to her back.  Pelvic pain.

ACUTE ABDOMEN SERIES (ABDOMEN 2 VIEW & CHEST 1 VIEW)

[w chest pa]
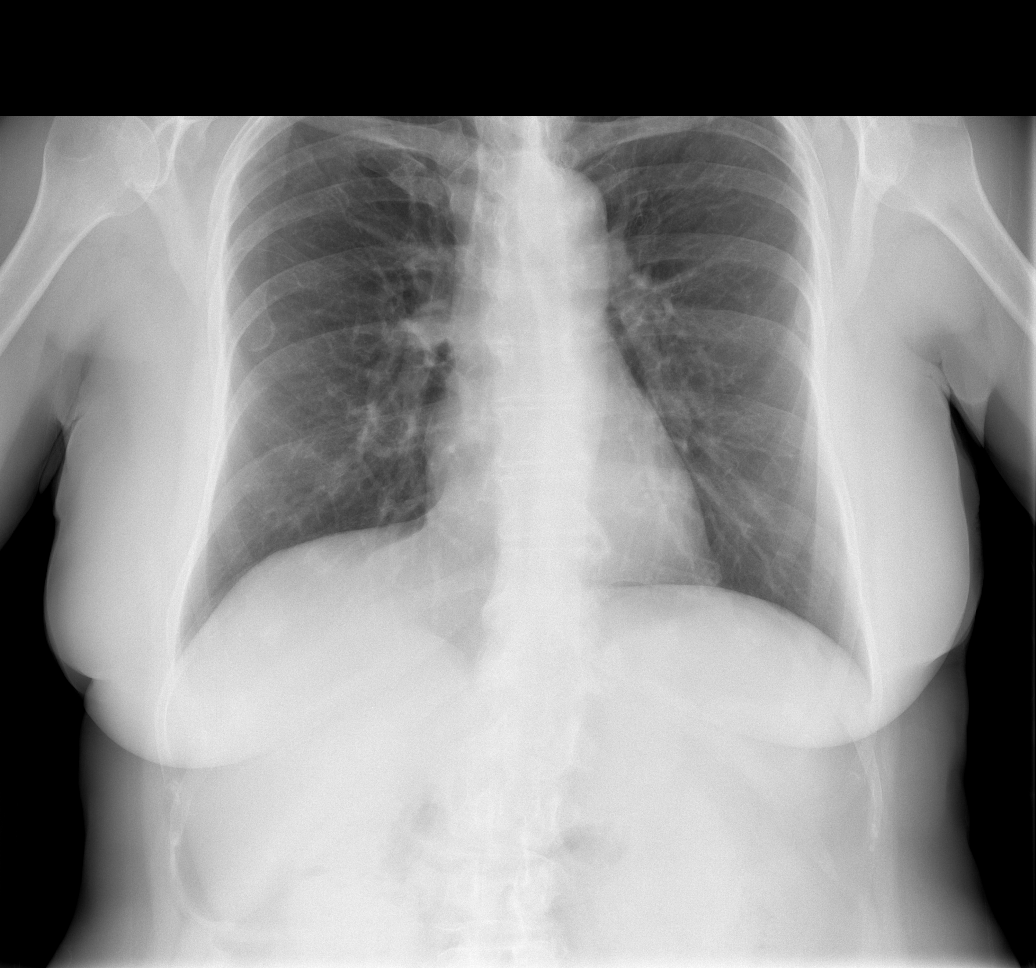

[w abdomen upright (1 of 2)]
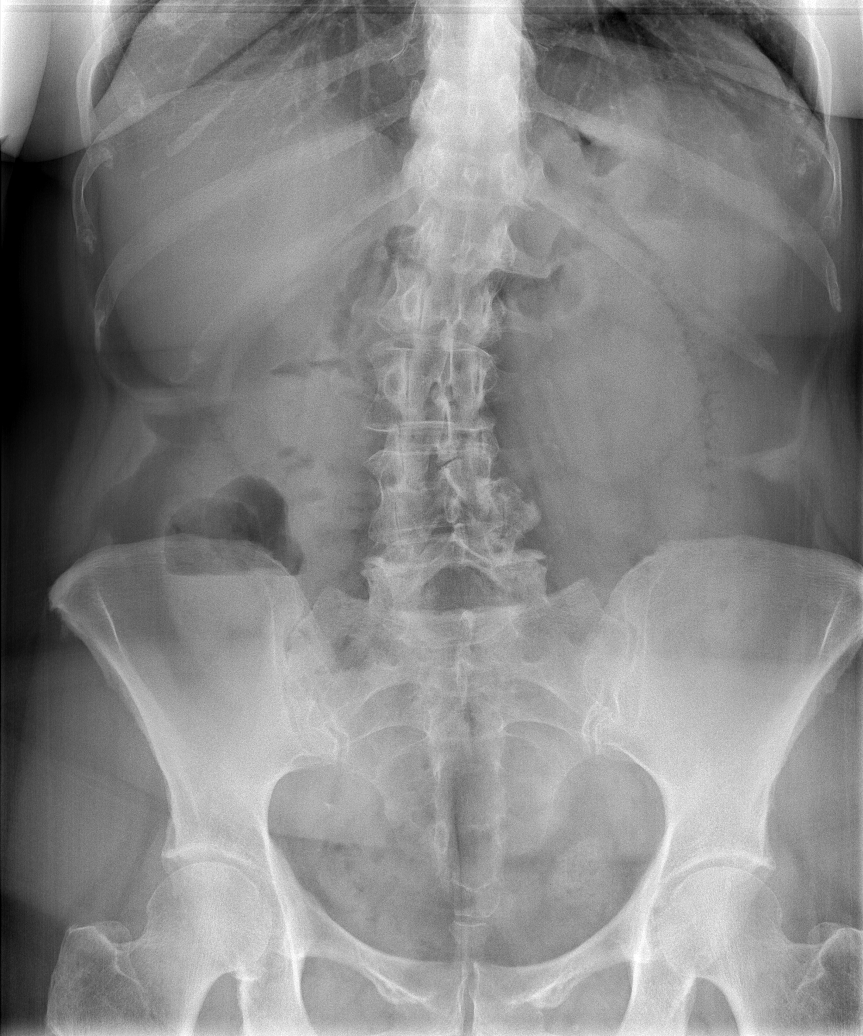

[w abdomen upright (2 of 2)]
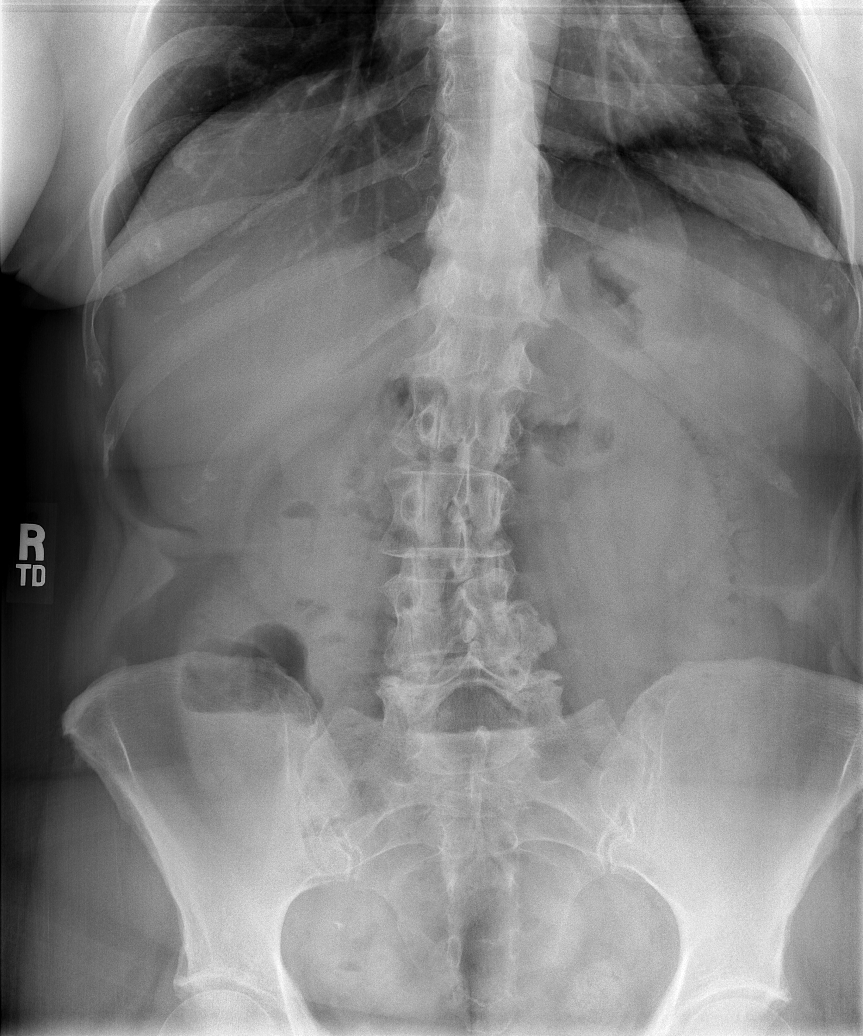

[t abdomen supine (1 of 2)]
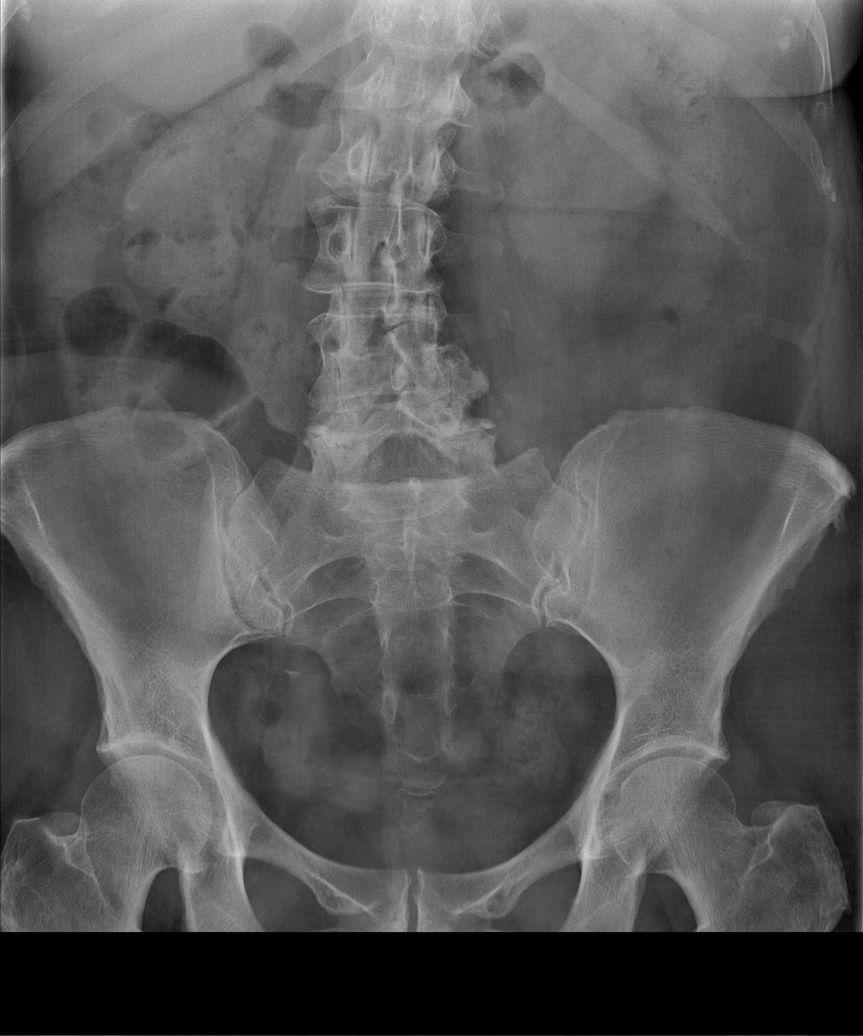

[t abdomen supine (2 of 2)]
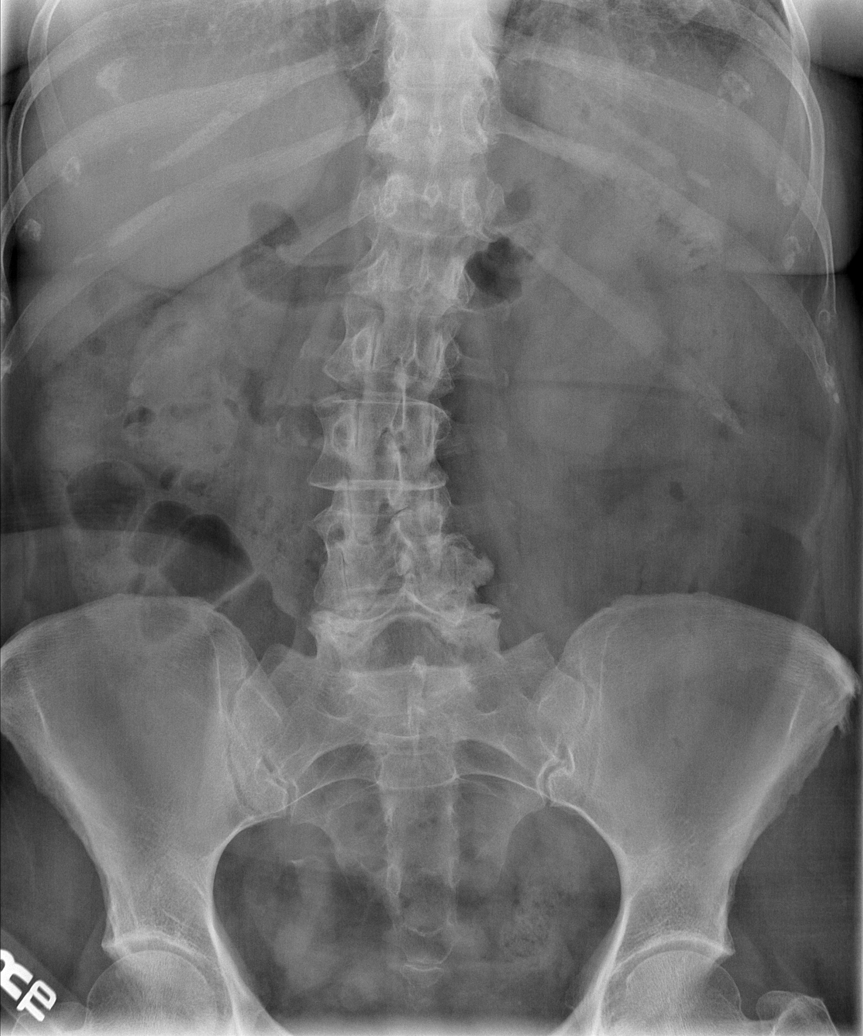

[5 of 5 positions shown; findings below may reference images not displayed]

FINDINGS: The heart size is normal.  The lungs are clear.

Supine and upright views of the abdomen demonstrate no evidence for
obstruction or free air.  A focal air fluid level is present
within nondilated cecum.  Mild rightward curvature is noted in the
lumbar spine.  The pelvis is unremarkable.
IMPRESSION: 1.  Air-fluid level within nondilated cecum may represent focal
ileus.
2.  Mild scoliosis of the thoracolumbar spine.

## 2012-02-29 ENCOUNTER — Telehealth: Payer: Self-pay | Admitting: Gastroenterology

## 2012-02-29 NOTE — Telephone Encounter (Signed)
The patient called reporting that she had LLQ pain with hematochezia.  Her symptoms started this past weekend and initially it was with vomiting and diarrhea.  She did have some mild hematochezia that she attributed to hemorrhoids, but this morning she had more bleeding and LLQ pain.  I advised her to come to the ER, but she wants to hold off.  I think she has ischemic colitis from her description.  Her last colonoscopy with Dr. Marina Goodell was two years ago and it was only significant for diverticulosis.  If she worsens I told her to call again or come to the ER, otherwise she is to call Dr. Marina Goodell in the morning.  I ordered a limited supply of tramadol 50 mg TID PRN X 5 days.

## 2012-03-01 ENCOUNTER — Telehealth: Payer: Self-pay | Admitting: Internal Medicine

## 2012-03-01 NOTE — Telephone Encounter (Signed)
The pt called a little while ago and states that she is some better but requested an appt with Dr. Marina Goodell. Pt has been scheduled to see Dr. Marina Goodell 03/03/12 @10am , there was an opening on the schedule.

## 2012-03-01 NOTE — Telephone Encounter (Signed)
Pt states she had some rectal bleeding and abdominal pain over the weekend, she spoke to Dr. Elnoria Howard who was on call over the weekend. Pt was given Ultram for pain and told to call the office. Pt scheduled to see Dr. Marina Goodell 03/03/12@10am . Pt aware of appt date and time.

## 2012-03-01 NOTE — Telephone Encounter (Signed)
Kayla Stokes, see Dr. Haywood Pao note below. Call this patient and have her see an extender, if she is willing. I think it is best that she is evaluated for the symptoms.

## 2012-03-03 ENCOUNTER — Encounter: Payer: Self-pay | Admitting: Internal Medicine

## 2012-03-03 ENCOUNTER — Ambulatory Visit (INDEPENDENT_AMBULATORY_CARE_PROVIDER_SITE_OTHER): Payer: BC Managed Care – PPO | Admitting: Internal Medicine

## 2012-03-03 VITALS — BP 120/72 | HR 64 | Ht 64.0 in | Wt 169.8 lb

## 2012-03-03 DIAGNOSIS — K5289 Other specified noninfective gastroenteritis and colitis: Secondary | ICD-10-CM

## 2012-03-03 DIAGNOSIS — K625 Hemorrhage of anus and rectum: Secondary | ICD-10-CM

## 2012-03-03 DIAGNOSIS — K529 Noninfective gastroenteritis and colitis, unspecified: Secondary | ICD-10-CM

## 2012-03-03 NOTE — Progress Notes (Signed)
HISTORY OF PRESENT ILLNESS:  Kayla Stokes is a 67 y.o. female with the below listed medical history who has been seen in this office previously for abdominal complaints and high-risk screening colonoscopy due to a family history. She presents today for evaluation of an acute GI illness. She last underwent routine screening colonoscopy in June of 2010. Moderate left-sided diverticulosis, but otherwise normal. Followup in 5 years recommended. She was last seen in the office November 2011 for upper abdominal pain with radiation to the back as well as nausea and vomiting. Workup was negative including ultrasound, HIDA scan, and laboratories. She also underwent upper endoscopy in December 2011 for epigastric pain. This was negative except for mild gastritis. Clo biopsy negative. She has not been seen since. She tells me that she was feeling well until 5 days ago when she developed nonspecific fatigue. Subsequently, after consuming soup earlier that day, developed several episodes of vomiting followed by bloating and significant diarrhea. Several others consumed the same soup without problems. Some intermittent minor rectal bleeding noted. Symptoms resolved within a day. She modified her diet to a bland diet. She reported occasional streaking of bright red blood. Feeling pretty well today. GI review of systems is otherwise negative. Of interest, she tells me that she was hospitalized in [place] on July 4 of this year with pancreatitis. No details available.  REVIEW OF SYSTEMS:  All non-GI ROS negative except for anxiety, sinus and allergy, insomnia, ankle swelling, urinary leakage  Past Medical History  Diagnosis Date  . High cholesterol   . Arthritis     finger  . Eczema     elbow  . Hypertension     under control, has been on med. x 10 yrs.  . Dental crowns present   . Mass of hand 05/2011    right  . Pancreatitis   . H. pylori infection   . Diverticulosis   . Allergic rhinitis   . Anxiety      Past Surgical History  Procedure Date  . Varicose vein surgery 02/13/2003    ligation and stripping left greater saphenous; exc.  multiple varicosities  . Vaginal hysterectomy with a&p repair 1989  . Cataract extraction w/ intraocular lens  implant, bilateral 09/2010, 01/2011    bilateral  . Knee arthroscopy 02/12/2011    Procedure: ARTHROSCOPY KNEE;  Surgeon: Loanne Drilling;  Location: [facility];  Service: Orthopedics;  Laterality: Left;  . Meniscus debridement 02/12/2011    Procedure: DEBRIDEMENT OF MENISCUS;  Surgeon: Loanne Drilling;  Location: [facility];  Service: Orthopedics;  Laterality: Left;  . Chondroplasty 02/12/2011    Procedure: CHONDROPLASTY;  Surgeon: Loanne Drilling;  Location: [facility];  Service: Orthopedics;  Laterality: Left;  Marland Kitchen Mass excision 06/05/2011    Procedure: EXCISION MASS;  Surgeon: Sharma Covert, MD;  Location: [facility];  Service: Orthopedics;  Laterality: Right;  right thumb  . Eye surgery     laser right eye; due to right eye hemorrhage; had left eye retinal tear with subsequent surgery  . Mouth surgery   . Tubal ligation   . Ablation saphenous vein w/ rfa 10-2011    Social History [place]  reports that she has never smoked. She has never used smokeless tobacco. She reports that she drinks alcohol. She reports that she does not use illicit drugs.  family history includes Cervical cancer in her sister; Colon cancer (age of onset:70) in her mother; Diabetes  in her mother; Heart disease in her brother and mother; Hypertension in her father and mother; Lung cancer in her brother; and Throat cancer in her brother.  There is no history of Colon polyps.  Allergies  Allergen Reactions  . Nitrofurantoin Monohyd Macro Other (See Comments)    SEVERE STOMACH PAIN       PHYSICAL EXAMINATION: Vital signs: BP 120/72  Pulse 64  Ht 5\' 4"  (1.626 m)  Wt 169 lb 12.8 oz (77.021 kg)  BMI  29.15 kg/m2  Constitutional: generally well-appearing, no acute distress Psychiatric: alert and oriented x3, cooperative Eyes: extraocular movements intact, anicteric, conjunctiva pink Mouth: oral pharynx moist, no lesions Neck: supple no lymphadenopathy Cardiovascular: heart regular rate and rhythm, no murmur Lungs: clear to auscultation bilaterally Abdomen: soft, nontender, nondistended, no obvious ascites, no peritoneal signs, normal bowel sounds, no organomegaly Rectal: Soft brown Hemoccult negative stool. No mass or tenderness Extremities: no lower extremity edema bilaterally Skin: no lesions on visible extremities Neuro: No focal deficits.   ASSESSMENT:  #1. Acute gastroenteritis. Resolved #2. Minor intermittent rectal bleeding due to benign anorectal pathology. Resolved #3. Family history of colon cancer. Last colonoscopy in June 2010 #4. Reported history of pancreatitis July 2013, elsewhere. No records. Offered to review for her  PLAN:  #1. Expectant management #2. Surveillance colonoscopy around June 2015 #3. I will review outside records regarding her episode of pancreatitis, if she is able to obtain them and present them #4. Resume general medical care with primary provider. GI followup as needed

## 2012-03-03 NOTE — Patient Instructions (Addendum)
Please follow up as needed 

## 2012-05-16 ENCOUNTER — Emergency Department (HOSPITAL_COMMUNITY)
Admission: EM | Admit: 2012-05-16 | Discharge: 2012-05-16 | Disposition: A | Discharge status: Home / Self Care | Payer: BC Managed Care – PPO | Attending: Emergency Medicine | Admitting: Emergency Medicine

## 2012-05-16 ENCOUNTER — Encounter (HOSPITAL_COMMUNITY): Payer: Self-pay

## 2012-05-16 ENCOUNTER — Telehealth: Payer: Self-pay | Admitting: Internal Medicine

## 2012-05-16 DIAGNOSIS — R109 Unspecified abdominal pain: Secondary | ICD-10-CM

## 2012-05-16 DIAGNOSIS — Z79899 Other long term (current) drug therapy: Secondary | ICD-10-CM | POA: Insufficient documentation

## 2012-05-16 DIAGNOSIS — Z8719 Personal history of other diseases of the digestive system: Secondary | ICD-10-CM | POA: Insufficient documentation

## 2012-05-16 DIAGNOSIS — Z8739 Personal history of other diseases of the musculoskeletal system and connective tissue: Secondary | ICD-10-CM | POA: Insufficient documentation

## 2012-05-16 DIAGNOSIS — Z872 Personal history of diseases of the skin and subcutaneous tissue: Secondary | ICD-10-CM | POA: Insufficient documentation

## 2012-05-16 DIAGNOSIS — I10 Essential (primary) hypertension: Secondary | ICD-10-CM | POA: Insufficient documentation

## 2012-05-16 DIAGNOSIS — F411 Generalized anxiety disorder: Secondary | ICD-10-CM | POA: Insufficient documentation

## 2012-05-16 DIAGNOSIS — E7800 Pure hypercholesterolemia, unspecified: Secondary | ICD-10-CM | POA: Insufficient documentation

## 2012-05-16 DIAGNOSIS — Z8619 Personal history of other infectious and parasitic diseases: Secondary | ICD-10-CM | POA: Insufficient documentation

## 2012-05-16 DIAGNOSIS — Z8709 Personal history of other diseases of the respiratory system: Secondary | ICD-10-CM | POA: Insufficient documentation

## 2012-05-16 DIAGNOSIS — Z9851 Tubal ligation status: Secondary | ICD-10-CM | POA: Insufficient documentation

## 2012-05-16 DIAGNOSIS — R1013 Epigastric pain: Secondary | ICD-10-CM | POA: Insufficient documentation

## 2012-05-16 LAB — COMPREHENSIVE METABOLIC PANEL
ALT: 27 U/L (ref 0–35)
Albumin: 4 g/dL (ref 3.5–5.2)
Alkaline Phosphatase: 71 U/L (ref 39–117)
BUN: 19 mg/dL (ref 6–23)
Calcium: 9.6 mg/dL (ref 8.4–10.5)
GFR calc Af Amer: 66 mL/min — ABNORMAL LOW (ref >90)
Glucose, Bld: 105 mg/dL — ABNORMAL HIGH (ref 70–99)
Potassium: 3.1 mEq/L — ABNORMAL LOW (ref 3.5–5.1)
Sodium: 139 mEq/L (ref 135–145)
Total Protein: 7.7 g/dL (ref 6.0–8.3)

## 2012-05-16 LAB — URINALYSIS, ROUTINE W REFLEX MICROSCOPIC
Leukocytes, UA: NEGATIVE
Protein, ur: NEGATIVE mg/dL
Specific Gravity, Urine: 1.015 (ref 1.005–1.030)
Urobilinogen, UA: 0.2 mg/dL (ref 0.0–1.0)

## 2012-05-16 LAB — CBC WITH DIFFERENTIAL/PLATELET
Basophils Relative: 0 % (ref 0–1)
Eosinophils Absolute: 0.2 10*3/uL (ref 0.0–0.7)
Eosinophils Relative: 2 % (ref 0–5)
Lymphs Abs: 2.1 10*3/uL (ref 0.7–4.0)
MCH: 30.8 pg (ref 26.0–34.0)
MCHC: 34.6 g/dL (ref 30.0–36.0)
MCV: 89 fL (ref 78.0–100.0)
Neutrophils Relative %: 67 % (ref 43–77)
Platelets: 324 10*3/uL (ref 150–400)

## 2012-05-16 LAB — LIPASE, BLOOD: Lipase: 33 U/L (ref 11–59)

## 2012-05-16 MED ORDER — PROMETHAZINE HCL 25 MG PO TABS: 25.0000 mg | ORAL_TABLET | Freq: Four times a day (QID) | ORAL | Status: DC | PRN

## 2012-05-16 MED ORDER — HYDROMORPHONE HCL PF 1 MG/ML IJ SOLN
1.0000 mg | Freq: Once | INTRAMUSCULAR | Status: AC
  Administered 2012-05-16: 1 mg via INTRAVENOUS
  Filled 2012-05-16: qty 1

## 2012-05-16 MED ORDER — OXYCODONE-ACETAMINOPHEN 5-325 MG PO TABS: 2.0000 | ORAL_TABLET | ORAL | Status: DC | PRN

## 2012-05-16 MED ORDER — HYDROMORPHONE HCL PF 1 MG/ML IJ SOLN
0.5000 mg | Freq: Once | INTRAMUSCULAR | Status: AC
  Administered 2012-05-16: 0.5 mg via INTRAVENOUS
  Filled 2012-05-16: qty 1

## 2012-05-16 MED ORDER — SODIUM CHLORIDE 0.9 % IV SOLN
20.0000 mL | INTRAVENOUS | Status: DC
  Administered 2012-05-16: 20 mL via INTRAVENOUS

## 2012-05-16 MED ORDER — PANTOPRAZOLE SODIUM 40 MG IV SOLR
40.0000 mg | Freq: Once | INTRAVENOUS | Status: AC
  Administered 2012-05-16: 40 mg via INTRAVENOUS
  Filled 2012-05-16: qty 40

## 2012-05-16 NOTE — ED Notes (Signed)
Patient states that she had some chicken without the skin and some vegetables when she began having abdominal pain. Hx of pancreatitis last summer. States that this pain fells like that. States that her stomach is bloated. 9/10 pain

## 2012-05-16 NOTE — ED Notes (Signed)
Pt presents with stomach pain HX of pancreatic pain radiates to the back, bloating and nausea.  HX of the same.  Pt states this is similar pain from the pain.  Denies Chest pain and Shortness of breath then and st this present time.

## 2012-05-16 NOTE — ED Provider Notes (Signed)
History     CSN: [id number]  Arrival date & time 05/16/12  1422   First MD Initiated Contact with Patient 05/16/12 1505      Chief Complaint  Patient presents with  . Abdominal Pain  . Nausea  . Emesis    (Consider location/radiation/quality/duration/timing/severity/associated sxs/prior treatment) HPI..... epigastric pain with radiation to the back since earlier today. Nothing makes symptoms better or worse. She has a history of pancreatitis of uncertain etiology. HIDA scan several years ago was normal. Her gastroenterologist Dr. Marina Goodell has performed an endoscopy and colonoscopy in the past. Severity is moderate. She ate a small amount today. Normal BMs. Normal urine output  Past Medical History  Diagnosis Date  . High cholesterol   . Arthritis     finger  . Eczema     elbow  . Hypertension     under control, has been on med. x 10 yrs.  . Dental crowns present   . Mass of hand 05/2011    right  . Pancreatitis   . H. pylori infection   . Diverticulosis   . Allergic rhinitis   . Anxiety     Past Surgical History  Procedure Laterality Date  . Varicose vein surgery  02/13/2003    ligation and stripping left greater saphenous; exc.  multiple varicosities  . Vaginal hysterectomy with a&p repair  1989  . Cataract extraction w/ intraocular lens  implant, bilateral  09/2010, 01/2011    bilateral  . Knee arthroscopy  02/12/2011    Procedure: ARTHROSCOPY KNEE;  Surgeon: Loanne Drilling;  Location: [facility];  Service: Orthopedics;  Laterality: Left;  . Meniscus debridement  02/12/2011    Procedure: DEBRIDEMENT OF MENISCUS;  Surgeon: Loanne Drilling;  Location: [facility];  Service: Orthopedics;  Laterality: Left;  . Chondroplasty  02/12/2011    Procedure: CHONDROPLASTY;  Surgeon: Loanne Drilling;  Location: [facility];  Service: Orthopedics;  Laterality: Left;  Marland Kitchen Mass excision  06/05/2011    Procedure: EXCISION MASS;  Surgeon: Sharma Covert, MD;  Location: [facility];  Service: Orthopedics;  Laterality: Right;  right thumb  . Eye surgery      laser right eye; due to right eye hemorrhage; had left eye retinal tear with subsequent surgery  . Mouth surgery    . Tubal ligation    . Ablation saphenous vein w/ rfa  10-2011    Family History  Problem Relation Age of Onset  . Diabetes Mother   . Hypertension Mother   . Heart disease Mother   . Hypertension Father   . Cervical cancer Sister   . Heart disease Brother   . Lung cancer Brother   . Throat cancer Brother   . Colon cancer Mother 12  . Colon polyps Neg Hx     History  Substance Use Topics  . Smoking status: Never Smoker   . Smokeless tobacco: Never Used  . Alcohol Use: 0.0 oz/week     Comment: occasionally-Rare    OB History   Grav Para Term Preterm Abortions TAB SAB Ect Mult Living   3 3 3       3       Review of Systems  All other systems reviewed and are negative.    Allergies  Nitrofurantoin monohyd macro  Home Medications   Current Outpatient Rx  Name  Route  Sig  Dispense  Refill  . acetaminophen (TYLENOL) 500 MG tablet  Oral   Take 500 mg by mouth every 6 (six) hours as needed for pain.         Marland Kitchen ALPRAZolam (XANAX) 0.25 MG tablet   Oral   Take 0.25 mg by mouth at bedtime as needed. For anxiety.         . cholecalciferol (VITAMIN D) 1000 UNITS tablet   Oral   Take 2,000 Units by mouth every morning.         . estrogens, conjugated, (PREMARIN) 0.3 MG tablet   Oral   Take 0.3 mg by mouth at bedtime.         . Flaxseed, Linseed, (FLAXSEED OIL PO)   Oral   Take 1 capsule by mouth at bedtime.          Marland Kitchen glucosamine-chondroitin 500-400 MG tablet   Oral   Take 1 tablet by mouth 2 (two) times daily.          Marland Kitchen loratadine (CLARITIN) 10 MG tablet   Oral   Take 10 mg by mouth every morning.         . magnesium oxide (MAG-OX) 400 MG tablet   Oral   Take 400 mg by mouth every morning.          . Multiple Vitamin (MULTIVITAMIN) tablet   Oral   Take 1 tablet by mouth every morning.          . rosuvastatin (CRESTOR) 10 MG tablet   Oral   Take 5 mg by mouth at bedtime.          . traMADol (ULTRAM) 50 MG tablet   Oral   Take 50 mg by mouth 3 (three) times daily as needed for pain.         Marland Kitchen triamterene-hydrochlorothiazide (DYAZIDE) 37.5-25 MG per capsule   Oral   Take 1 capsule by mouth every morning.         Marland Kitchen oxyCODONE-acetaminophen (PERCOCET) 5-325 MG per tablet   Oral   Take 2 tablets by mouth every 4 (four) hours as needed for pain.   20 tablet   0   . promethazine (PHENERGAN) 25 MG tablet   Oral   Take 1 tablet (25 mg total) by mouth every 6 (six) hours as needed for nausea.   20 tablet   0     BP 112/59  Pulse 69  Temp(Src) 97.9 F (36.6 C) (Oral)  Resp 18  SpO2 96%  Physical Exam  Nursing note and vitals reviewed. Constitutional: She is oriented to person, place, and time. She appears well-developed and well-nourished.  HENT:  Head: Normocephalic and atraumatic.  Eyes: Conjunctivae and EOM are normal. Pupils are equal, round, and reactive to light.  Neck: Normal range of motion. Neck supple.  Cardiovascular: Normal rate, regular rhythm and normal heart sounds.   Pulmonary/Chest: Effort normal and breath sounds normal.  Abdominal:  Tender epigastrium  Musculoskeletal: Normal range of motion.  Neurological: She is alert and oriented to person, place, and time.  Skin: Skin is warm and dry.  Psychiatric: She has a normal mood and affect.    ED Course  Procedures (including critical care time)  Labs Reviewed  COMPREHENSIVE METABOLIC PANEL - Abnormal; Notable for the following:    Potassium 3.1 (*)    Glucose, Bld 105 (*)    GFR calc non Af Amer 57 (*)    GFR calc Af Amer 66 (*)    All other components within normal limits  CBC WITH DIFFERENTIAL  LIPASE, BLOOD  AMYLASE  URINALYSIS, ROUTINE W REFLEX MICROSCOPIC   No results  found.   1. Abdominal pain       MDM  No acute abdomen. Screening labs negative. Patient feeling better after IV hydration and pain management.   Will followup with gastroenterologist        Donnetta Hutching, MD 05/16/12 2146

## 2012-05-16 NOTE — Telephone Encounter (Signed)
Pt called to state she is having knife-like pain in the epigastrium which is radiating into her back. Hx of pancreatitis and this feels similar Also with nausea and vomiting. Scant amount of red blood in her emesis (she described as what she ate for lunch + "a little bit of blood") I advised ED evaluation, she voiced understanding and thanked me for the call  Chart review -- hx of mild gastritis, H pylori neg in 2011, reported hx of pancreatitis treated in [place] but records to this regarding not available

## 2012-05-17 ENCOUNTER — Encounter: Payer: Self-pay | Admitting: Gastroenterology

## 2012-05-17 ENCOUNTER — Ambulatory Visit (INDEPENDENT_AMBULATORY_CARE_PROVIDER_SITE_OTHER): Payer: BC Managed Care – PPO | Admitting: Gastroenterology

## 2012-05-17 ENCOUNTER — Telehealth: Payer: Self-pay | Admitting: Internal Medicine

## 2012-05-17 VITALS — BP 116/70 | HR 82 | Wt 168.2 lb

## 2012-05-17 DIAGNOSIS — R112 Nausea with vomiting, unspecified: Secondary | ICD-10-CM

## 2012-05-17 DIAGNOSIS — Z8719 Personal history of other diseases of the digestive system: Secondary | ICD-10-CM

## 2012-05-17 DIAGNOSIS — R1013 Epigastric pain: Secondary | ICD-10-CM

## 2012-05-17 MED ORDER — OMEPRAZOLE 20 MG PO CPDR: 20.0000 mg | DELAYED_RELEASE_CAPSULE | Freq: Every day | ORAL | Status: DC

## 2012-05-17 NOTE — Telephone Encounter (Signed)
Pt spoke with Dr. Rhea Belton over the weekend, was told to go to ER. Pt went to ER yesterday, states she had terrible abdominal pain. Pt thinks it may be her gallbladder. Pt scheduled to see Doug Sou PA today at 3pm. Pt aware of appt date and time.

## 2012-05-17 NOTE — Progress Notes (Signed)
05/17/2012 Kayla Stokes [id number] [birth year]   History of Present Illness:  Patient is a pleasant 67 year old female who presents to our office today with complaints of epigastric abdominal pain and nausea/vomiting that led her to the ED yesterday.  She says that she has been having the same pain and symptoms since 2005, but with increasing frequency.  She has undergone extensive evaluation of the years with EGD's, CT scans, ultrasounds, and HIDA scan, which have all been relatively unrevealing.  Her only positive history is an episode of pancreatitis that she says she was hospitalized for last summer in [place], [place].  We still do not have those records, but we are going to request those again from her PCP.  She is here today because of a recurrent episode yesterday with severe epigastric abdominal pain and nausea with vomiting.  She did not have any imaging, but amylase, lipase, CBC, and CMP were normal except for a slightly low potassium.  She says that today she feels better and tolerated some light food without increased pain.  She is very upset and frustrated because she is not sure what she is supposed to eat and does not know why she keeps having all of this pain.  Current Medications, Allergies, Past Medical History, Past Surgical History, Family History and Social History were reviewed in [employer] record.   Physical Exam: BP 116/70  Pulse 82  Wt 168 lb 3.2 oz (76.295 kg)  BMI 28.86 kg/m2  SpO2 98% General: Well developed, white female in no acute distress Head: Normocephalic and atraumatic Eyes:  sclerae anicteric, conjunctiva pink  Ears: Normal auditory acuity Lungs: Clear throughout to auscultation Heart: Regular rate and rhythm Abdomen: Soft, non-distended. No masses, no hepatomegaly. Normal bowel sounds.  Left-epigastric TTP without R/R/G Musculoskeletal: Symmetrical with no gross deformities  Extremities: No edema  Neurological: Alert oriented x  4, grossly nonfocal Psychological:  Alert and cooperative. Normal mood and affect  Assessment and Recommendations: -Recurrent episodes of epigastric abdominal pain associated with nausea, occurring since 2005 but with more frequency now.  Previous CT scans, ultrasounds, EGD's, and HIDA scan unremarkable. -Episode of acute pancreatitis last year in [place], [place].  *Will recheck CT scan of the abdomen with contrast. *Will start her on a daily PPI.  Will also follow a low-fat and anti-reflux diet.   *We will try again to obtain the records from her PCP/[place] for review. *She will return to the office in 3-4 weeks to reassess and determine further evaluation and management.

## 2012-05-17 NOTE — Patient Instructions (Addendum)
You have been scheduled for a CT scan of the abdomen at [facility] ([address]---this is in the same building as [facility]).   You are scheduled on 05-19-12 at 9:00am. You should arrive 15 minutes prior to your appointment time for registration. Please follow the written instructions below on the day of your exam:  WARNING: IF YOU ARE ALLERGIC TO IODINE/X-RAY DYE, PLEASE NOTIFY RADIOLOGY IMMEDIATELY AT [phone]! YOU WILL BE GIVEN A 13 HOUR PREMEDICATION PREP.  1) Do not eat or drink anything after 5:00am (4 hours prior to your test) 2) You have been given 2 bottles of oral contrast to drink. The solution may taste better if refrigerated, but do NOT add ice or any other liquid to this solution. Shake well before drinking.    Drink 1 bottle of contrast @ 7:00am (2 hours prior to your exam)  Drink 1 bottle of contrast @ 8:00am (1 hour prior to your exam)  You may take any medications as prescribed with a small amount of water except for the following: Metformin, Glucophage, Glucovance, Avandamet, Riomet, Fortamet, Actoplus Met, Janumet, Glumetza or Metaglip. The above medications must be held the day of the exam AND 48 hours after the exam.  We have sent the following medications to your pharmacy for you to pick up at your convenience:  Omeprazole  Please follow up with Shanda Bumps in 3-4 weeks   The purpose of you drinking the oral contrast is to aid in the visualization of your intestinal tract. The contrast solution may cause some diarrhea. Before your exam is started, you will be given a small amount of fluid to drink. Depending on your individual set of symptoms, you may also receive an intravenous injection of x-ray contrast/dye. Plan on being at [facility] for 30 minutes or long, depending on the type of exam you are having performed.  If you have any questions regarding your exam or if you need to reschedule, you may call the CT department at [phone]  between the hours of 8:00 am and 5:00 pm, Monday-Friday.  ________________________________________________________________________

## 2012-05-19 ENCOUNTER — Ambulatory Visit (INDEPENDENT_AMBULATORY_CARE_PROVIDER_SITE_OTHER)
Admission: RE | Admit: 2012-05-19 | Discharge: 2012-05-19 | Disposition: A | Discharge status: Home / Self Care | Payer: BC Managed Care – PPO | Source: Ambulatory Visit | Attending: Gastroenterology | Admitting: Gastroenterology

## 2012-05-19 DIAGNOSIS — R1013 Epigastric pain: Secondary | ICD-10-CM

## 2012-05-19 MED ORDER — IOHEXOL 300 MG/ML  SOLN
80.0000 mL | Freq: Once | INTRAMUSCULAR | Status: AC | PRN
  Administered 2012-05-19: 80 mL via INTRAVENOUS

## 2012-05-20 ENCOUNTER — Telehealth: Payer: Self-pay | Admitting: Internal Medicine

## 2012-05-20 NOTE — Telephone Encounter (Signed)
Let pt know that we do not schedule appts for second opinions, that her PCP would need to do this. Also let her know we would consider that a transfer of care. Pt asked about getting her records. Informed pt that she could sign a release in medical records and have her records faxed where she chooses to go for her second opinion. Pt verbalized understanding.

## 2012-05-22 ENCOUNTER — Emergency Department (HOSPITAL_COMMUNITY)
Admission: EM | Admit: 2012-05-22 | Discharge: 2012-05-22 | Disposition: A | Discharge status: Home / Self Care | Payer: BC Managed Care – PPO | Attending: Emergency Medicine | Admitting: Emergency Medicine

## 2012-05-22 ENCOUNTER — Encounter (HOSPITAL_COMMUNITY): Payer: Self-pay | Admitting: Emergency Medicine

## 2012-05-22 ENCOUNTER — Emergency Department (HOSPITAL_COMMUNITY): Payer: BC Managed Care – PPO

## 2012-05-22 DIAGNOSIS — Z872 Personal history of diseases of the skin and subcutaneous tissue: Secondary | ICD-10-CM | POA: Insufficient documentation

## 2012-05-22 DIAGNOSIS — Z8719 Personal history of other diseases of the digestive system: Secondary | ICD-10-CM | POA: Insufficient documentation

## 2012-05-22 DIAGNOSIS — I10 Essential (primary) hypertension: Secondary | ICD-10-CM | POA: Insufficient documentation

## 2012-05-22 DIAGNOSIS — G8929 Other chronic pain: Secondary | ICD-10-CM | POA: Insufficient documentation

## 2012-05-22 DIAGNOSIS — F411 Generalized anxiety disorder: Secondary | ICD-10-CM | POA: Insufficient documentation

## 2012-05-22 DIAGNOSIS — Z8739 Personal history of other diseases of the musculoskeletal system and connective tissue: Secondary | ICD-10-CM | POA: Insufficient documentation

## 2012-05-22 DIAGNOSIS — R1013 Epigastric pain: Secondary | ICD-10-CM | POA: Insufficient documentation

## 2012-05-22 DIAGNOSIS — R112 Nausea with vomiting, unspecified: Secondary | ICD-10-CM | POA: Insufficient documentation

## 2012-05-22 DIAGNOSIS — R109 Unspecified abdominal pain: Secondary | ICD-10-CM

## 2012-05-22 DIAGNOSIS — E7800 Pure hypercholesterolemia, unspecified: Secondary | ICD-10-CM | POA: Insufficient documentation

## 2012-05-22 DIAGNOSIS — Z9851 Tubal ligation status: Secondary | ICD-10-CM | POA: Insufficient documentation

## 2012-05-22 DIAGNOSIS — Z98811 Dental restoration status: Secondary | ICD-10-CM | POA: Insufficient documentation

## 2012-05-22 DIAGNOSIS — Z79899 Other long term (current) drug therapy: Secondary | ICD-10-CM | POA: Insufficient documentation

## 2012-05-22 DIAGNOSIS — Z8619 Personal history of other infectious and parasitic diseases: Secondary | ICD-10-CM | POA: Insufficient documentation

## 2012-05-22 LAB — CBC WITH DIFFERENTIAL/PLATELET
Basophils Absolute: 0 10*3/uL (ref 0.0–0.1)
Basophils Relative: 0 % (ref 0–1)
Lymphocytes Relative: 23 % (ref 12–46)
MCHC: 34.7 g/dL (ref 30.0–36.0)
Monocytes Absolute: 0.5 10*3/uL (ref 0.1–1.0)
Neutro Abs: 4.6 10*3/uL (ref 1.7–7.7)
Neutrophils Relative %: 67 % (ref 43–77)
Platelets: 275 10*3/uL (ref 150–400)
RDW: 13.8 % (ref 11.5–15.5)
WBC: 6.9 10*3/uL (ref 4.0–10.5)

## 2012-05-22 LAB — COMPREHENSIVE METABOLIC PANEL
ALT: 23 U/L (ref 0–35)
AST: 23 U/L (ref 0–37)
Albumin: 3.7 g/dL (ref 3.5–5.2)
Chloride: 99 mEq/L (ref 96–112)
Creatinine, Ser: 0.96 mg/dL (ref 0.50–1.10)
Sodium: 137 mEq/L (ref 135–145)
Total Bilirubin: 0.8 mg/dL (ref 0.3–1.2)

## 2012-05-22 LAB — URINALYSIS, ROUTINE W REFLEX MICROSCOPIC
Ketones, ur: NEGATIVE mg/dL
Leukocytes, UA: NEGATIVE
Nitrite: NEGATIVE
Protein, ur: NEGATIVE mg/dL
Urobilinogen, UA: 0.2 mg/dL (ref 0.0–1.0)

## 2012-05-22 LAB — TROPONIN I: Troponin I: <0.3 ng/mL (ref <0.30)

## 2012-05-22 MED ORDER — POTASSIUM CHLORIDE CRYS ER 20 MEQ PO TBCR
40.0000 meq | EXTENDED_RELEASE_TABLET | Freq: Once | ORAL | Status: AC
  Administered 2012-05-22: 40 meq via ORAL
  Filled 2012-05-22: qty 2

## 2012-05-22 MED ORDER — LACTATED RINGERS IV BOLUS (SEPSIS)
1000.0000 mL | Freq: Once | INTRAVENOUS | Status: AC
  Administered 2012-05-22: 1000 mL via INTRAVENOUS

## 2012-05-22 MED ORDER — HYDROMORPHONE HCL PF 1 MG/ML IJ SOLN: 1.0000 mg | Freq: Once | INTRAMUSCULAR | Status: DC

## 2012-05-22 MED ORDER — ONDANSETRON 4 MG PO TBDP: 4.0000 mg | ORAL_TABLET | Freq: Four times a day (QID) | ORAL | Status: DC | PRN

## 2012-05-22 MED ORDER — ONDANSETRON 8 MG PO TBDP
8.0000 mg | ORAL_TABLET | Freq: Once | ORAL | Status: AC
  Administered 2012-05-22: 8 mg via ORAL
  Filled 2012-05-22: qty 1

## 2012-05-22 MED ORDER — HYDROMORPHONE HCL PF 1 MG/ML IJ SOLN
0.5000 mg | Freq: Once | INTRAMUSCULAR | Status: AC
  Administered 2012-05-22: 0.5 mg via INTRAVENOUS
  Filled 2012-05-22: qty 1

## 2012-05-22 MED ORDER — SUCRALFATE 1 G PO TABS
1.0000 g | ORAL_TABLET | Freq: Once | ORAL | Status: AC
  Administered 2012-05-22: 1 g via ORAL
  Filled 2012-05-22: qty 1

## 2012-05-22 NOTE — ED Notes (Signed)
Pt refusing dilaudid at this time.

## 2012-05-22 NOTE — ED Notes (Signed)
Pt states that she was seen here on Sunday for same, given Rx and told to follow up with GI doctor, stated she followed up and CT scan was unremarkable from Wednesday.  Pt states pain 7/10 radiating to back after taking 2 Percocet at 2200.

## 2012-05-22 NOTE — ED Notes (Signed)
Pt to US at this time.

## 2012-05-22 NOTE — ED Provider Notes (Signed)
MDM  Kayla Stokes is a 67 y.o. female with intermittent pain since 2005 in the epigastrium with multiple workups including HIDA scan, ultrasound and most recently CT of the abdomen and pelvis which did not find any anatomical source of the patient's pain.  Patient gives some history of lactose intolerance, sometimes her pain is worse when she eats, she is currently in the midst of getting a second opinion and is no longer a patient of Dr. Lamar Sprinkles because of that. She is following up with [facility] digestive specialists.  CT abdomen pelvis was negative however not sensitive for sludge, obtain right upper quadrant ultrasound did show some sludge in the gallbladder - ultrasound otherwise unremarkable, no evidence for acute cholecystitis. Besides a mildly low potassium of 2.9, other labs are unremarkable as well. Patient's potassium repleted with a 40 mEq tablets.   I also do not think this epigastric pain is an anginal equivalent, EKG is unremarkable, troponin is negative. Patient's been having this severe intermittent pain since Sunday expect a bump in the troponin if it was cardiac.  My suspicion is that this patient is having functional abdominal pain versus recurrent gastritis, possible peptic ulcer disease that she's also had Helicobacter pylori infection in the past. We'll increase the patient's omeprazole, twice a day, also given to her 40 mg at night. Patient to keep a food journal, we'll give her Zofran ODT is for nausea so she can keep her pain medicine down should the pain recur.  Patient will followup 4/16 w/ new GI specialists.  No life threatening source of abdominal pain identified, nontoxic and afebrile.  I explained the diagnosis and have given explicit precautions to return to the ER including intractable N/V, worsening pain, change in character of pain. or any other new or worsening symptoms. The patient understands and accepts the medical plan as it's been dictated and I have answered  their questions. Discharge instructions concerning home care and prescriptions have been given.  The patient is STABLE and is discharged to home in good condition.    ED COURSE Labs Reviewed  CBC WITH DIFFERENTIAL  URINALYSIS, ROUTINE W REFLEX MICROSCOPIC  COMPREHENSIVE METABOLIC PANEL  LIPASE, BLOOD  TROPONIN I    Date: 05/22/2012  Rate: 60  Rhythm: normal sinus rhythm  QRS Axis: normal  Intervals: normal  ST/T Wave abnormalities: normal  Conduction Disutrbances: none  Narrative Interpretation: unremarkable - normal sinus rhythm, no change when compared with previous EKG from March 23  US Abdomen Complete  05/22/2012  *RADIOLOGY REPORT*  Clinical Data:  Epigastric pain.  COMPLETE ABDOMINAL ULTRASOUND  Comparison:  Abdominal ultrasound 01/09/2010.  Findings:  Gallbladder:  A small amount of echogenic material layering dependently in the gallbladder, without posterior acoustic shadowing, compatible with biliary sludge.  Gallbladder is moderately distended.  Normal wall thickness (1.9 mm).  No pericholecystic fluid.  Per report for the sonographer, the patient did not exhibit a sonographic Murphy's sign on examination.  Common bile duct:  Normal caliber measuring up to 4.6 mm in the porta hepatis.  Liver:  Diffusely echogenic, compatible with hepatic steatosis.  No focal cystic or solid hepatic lesions.  No intrahepatic biliary ductal dilatation.  Normal hepatopedal flow in the portal vein.  IVC:  Patent throughout its visualized course in the abdomen.  Pancreas:  Poorly visualized secondary to overlying bowel gas.  Spleen:  Normal size and echotexture without focal parenchymal abnormality.  7.9 cm in length.  Right Kidney:  No hydronephrosis.  Well-preserved cortex.  Normal  size and parenchymal echotexture without focal abnormalities.  9.6 cm in length.  Left Kidney:  No hydronephrosis.  Well-preserved cortex.  Normal size and parenchymal echotexture without focal abnormalities.  10.3 cm in  length.  Abdominal aorta:  Poorly visualized, but measures approximately 2.7 cm in diameter proximally.  IMPRESSION: 1.  Small amount of biliary sludge.  No signs of acute cholecystitis at this time. 2.  Hepatic steatosis.   Original Report Authenticated By: Trudie Reed, M.D.     1. Chronic abdominal pain   2. Epigastric pain   3. History of pancreatitis   4. History of Helicobacter pylori infection   5. NAUSEA AND VOMITING     Allergies  Nitrofurantoin monohyd macro History   CSN: [id number]  Arrival date & time: 3/29/140115; First MD Initiated Contact with Patient    Chief Complaint  Patient presents with  . Abdominal Pain  . Emesis   Abdominal Pain and Emesis  HPI: Kayla Stokes is a 67 y.o. female history of acute on chronic abdominal pain in the epigastrium since about 2005, patient was seen on Sunday for similar symptoms, she says it's worse today, her epigastric area is hurting, it is a sharp stabbing pain it radiates straight through to the back, is currently a 7-10 in intensity, it is constant but intermittently gets worse, associated with nausea and vomiting, no chest pain or shortness of breath or diaphoresis, she says she did drink coffee on Sunday when she had these episodes in drink coffee again today. Patient had no adverse effects with drinking tea. Patient had a followup with Dr. Lamar Sprinkles office and saw the PA, she had a CT of her abdomen and pelvis as an outpatient which was unremarkable. Patient has had extensive workup including HIDA scan, ultrasound and CT scans. Patient was hospitalized last July for pancreatitis-patient says she was only a social drinker at the time and now does not drink at all. She does not smoke and has never smoked. Denies any illicit drugs. Patient says she has an extensive family history of gallbladder disease-many women in her family have had their gallbladders removed.  Pain is similar to Sundays pain however it is worse.   ROS: At least  10pt or greater review of systems completed and are negative except where specified in the HPI.  VITAL SIGNS:   Filed Vitals:   05/22/12 0124  BP: 135/90  Pulse: 77  Temp: 97.3 F (36.3 C)  TempSrc: Oral  SpO2: 98%   CONSTITUTIONAL: Awake, orientedx4, appears non-toxic, well nourished and well developed HENT: Atraumatic, normocephalic, oral mucosa pink and moist w/o erythema or exudate, airway patent. Nares patent without drainage, turbinates appear pink, normal. External ears normal. EYES: Conjunctiva clear, EOMI, PERRLA NECK: Trachea midline, non-tender, supple CARDIOVASCULAR: Normal heart rate, Normal rhythm, No murmurs, rubs, gallops PULMONARY/CHEST: Clear to auscultation, no rhonchi, wheezes, or rales. Symmetrical breath sounds. Non-tender. ABDOMINAL: Non-distended, soft, mildly tender to palpation in the epigastric region without rebound or guarding.  BS normal. NEUROLOGIC: Non-focal, moving all four extremities, no gross sensory or motor deficits. EXTREMITIES: No clubbing, cyanosis, or edema SKIN: Warm, Dry, No erythema, No rash   Home Medications   Current Outpatient Rx  Name  Route  Sig  Dispense  Refill  . acetaminophen (TYLENOL) 500 MG tablet   Oral   Take 500 mg by mouth every 6 (six) hours as needed for pain.         Marland Kitchen ALPRAZolam (XANAX) 0.25 MG tablet   Oral  Take 0.25 mg by mouth at bedtime as needed. For anxiety.         . cholecalciferol (VITAMIN D) 1000 UNITS tablet   Oral   Take 2,000 Units by mouth every morning.         . estrogens, conjugated, (PREMARIN) 0.3 MG tablet   Oral   Take 0.3 mg by mouth at bedtime.         . Flaxseed, Linseed, (FLAXSEED OIL PO)   Oral   Take 1 capsule by mouth at bedtime.          Marland Kitchen loratadine (CLARITIN) 10 MG tablet   Oral   Take 10 mg by mouth every morning.         . magnesium oxide (MAG-OX) 400 MG tablet   Oral   Take 400 mg by mouth every morning.         . Multiple Vitamin (MULTIVITAMIN)  tablet   Oral   Take 1 tablet by mouth every morning.          Marland Kitchen omeprazole (PRILOSEC) 20 MG capsule   Oral   Take 1 capsule (20 mg total) by mouth daily.   30 capsule   3   . oxyCODONE-acetaminophen (PERCOCET) 5-325 MG per tablet   Oral   Take 2 tablets by mouth every 4 (four) hours as needed for pain.   20 tablet   0   . promethazine (PHENERGAN) 25 MG tablet   Oral   Take 1 tablet (25 mg total) by mouth every 6 (six) hours as needed for nausea.   20 tablet   0   . rosuvastatin (CRESTOR) 10 MG tablet   Oral   Take 5 mg by mouth at bedtime.          . traMADol (ULTRAM) 50 MG tablet   Oral   Take 50 mg by mouth 3 (three) times daily as needed for pain.         Marland Kitchen triamterene-hydrochlorothiazide (DYAZIDE) 37.5-25 MG per capsule   Oral   Take 1 capsule by mouth every morning.           Past Medical History  Diagnosis Date  . High cholesterol   . Arthritis     finger  . Eczema     elbow  . Hypertension     under control, has been on med. x 10 yrs.  . Dental crowns present   . Mass of hand 05/2011    right  . Pancreatitis   . H. pylori infection   . Diverticulosis   . Allergic rhinitis   . Anxiety     Past Surgical History  Procedure Laterality Date  . Varicose vein surgery  02/13/2003    ligation and stripping left greater saphenous; exc.  multiple varicosities  . Vaginal hysterectomy with a&p repair  1989  . Cataract extraction w/ intraocular lens  implant, bilateral  09/2010, 01/2011    bilateral  . Knee arthroscopy  02/12/2011    Procedure: ARTHROSCOPY KNEE;  Surgeon: Loanne Drilling;  Location: [facility];  Service: Orthopedics;  Laterality: Left;  . Meniscus debridement  02/12/2011    Procedure: DEBRIDEMENT OF MENISCUS;  Surgeon: Loanne Drilling;  Location: [facility];  Service: Orthopedics;  Laterality: Left;  . Chondroplasty  02/12/2011    Procedure: CHONDROPLASTY;  Surgeon: Loanne Drilling;  Location: [facility];  Service: Orthopedics;  Laterality: Left;  Marland Kitchen Mass excision  06/05/2011  Procedure: EXCISION MASS;  Surgeon: Sharma Covert, MD;  Location: [facility];  Service: Orthopedics;  Laterality: Right;  right thumb  . Eye surgery      laser right eye; due to right eye hemorrhage; had left eye retinal tear with subsequent surgery  . Mouth surgery    . Tubal ligation    . Ablation saphenous vein w/ rfa  10-2011    Family History  Problem Relation Age of Onset  . Diabetes Mother   . Hypertension Mother   . Heart disease Mother   . Hypertension Father   . Cervical cancer Sister   . Heart disease Brother   . Lung cancer Brother   . Throat cancer Brother   . Colon cancer Mother 66  . Colon polyps Neg Hx     History  Substance Use Topics  . Smoking status: Never Smoker   . Smokeless tobacco: Never Used  . Alcohol Use: 0.0 oz/week     Comment: occasionally-Rare         Jones Skene, MD 05/22/12 [phone]

## 2012-06-21 DIAGNOSIS — K635 Polyp of colon: Secondary | ICD-10-CM | POA: Insufficient documentation

## 2012-06-21 DIAGNOSIS — R609 Edema, unspecified: Secondary | ICD-10-CM | POA: Insufficient documentation

## 2012-06-21 DIAGNOSIS — K5790 Diverticulosis of intestine, part unspecified, without perforation or abscess without bleeding: Secondary | ICD-10-CM | POA: Insufficient documentation

## 2012-06-24 HISTORY — PX: CHOLECYSTECTOMY: SHX55

## 2012-10-07 ENCOUNTER — Encounter: Payer: Self-pay | Admitting: *Deleted

## 2012-10-13 ENCOUNTER — Telehealth: Payer: Self-pay | Admitting: *Deleted

## 2012-10-13 MED ORDER — TRIAMTERENE-HCTZ 37.5-25 MG PO CAPS: 1.0000 | ORAL_CAPSULE | Freq: Every morning | ORAL | Status: DC

## 2012-10-13 MED ORDER — ESTROGENS CONJUGATED 0.3 MG PO TABS: 0.3000 mg | ORAL_TABLET | Freq: Every day | ORAL | Status: DC

## 2012-10-13 NOTE — Telephone Encounter (Signed)
Okay to call both in, she did have an annual 08/2011.

## 2012-10-13 NOTE — Telephone Encounter (Signed)
rx sent, left on pt voicemail.

## 2012-10-13 NOTE — Telephone Encounter (Signed)
Pt has annual scheduled with you on 11/03/12, pt was last seen in 09/18/10 Dr.G former patient. Pt is requesting refill on HCTZ 37.5 MG & premarin 0.3 mg 1 po daily see note on 09/17/12 if needed. Okay to fill?

## 2012-11-03 ENCOUNTER — Encounter: Payer: Self-pay | Admitting: Women's Health

## 2012-11-03 ENCOUNTER — Ambulatory Visit (INDEPENDENT_AMBULATORY_CARE_PROVIDER_SITE_OTHER): Payer: Medicare Other | Admitting: Women's Health

## 2012-11-03 VITALS — BP 110/72 | Ht 63.0 in | Wt 152.8 lb

## 2012-11-03 DIAGNOSIS — Z79890 Hormone replacement therapy (postmenopausal): Secondary | ICD-10-CM

## 2012-11-03 MED ORDER — ESTROGENS CONJUGATED 0.3 MG PO TABS: 0.3000 mg | ORAL_TABLET | Freq: Every day | ORAL | Status: DC

## 2012-11-03 NOTE — Progress Notes (Signed)
Kayla Stokes Jul 20, 1956 [id number]    History:    The patient presents for rest and pelvic exam. TVH with A&P repair 1989. On Premarin 0.3 and has been weaning off. Normal Pap and mammogram history. Normal DEXA 2009 T score -0.2 at right femoral neck FRAX 7.6%/0.2%. Hypercholesteremia primary care manages labs and meds.  Past medical history, past surgical history, family history and social history were all reviewed and documented in the EPIC chart. Retired Engineer, site. Pancreatitis 2013 with good recovery. Cataracts 2012.  2004 varicose vein surgery. Parents hypertension. Mother diabetes and heart disease.   ROS:  A  ROS was performed and pertinent positives and negatives are included in the history.  Exam:  Filed Vitals:   11/03/12 1410  BP: 110/72    General appearance:  Normal Head/Neck:  Normal, without cervical or supraclavicular adenopathy. Thyroid:  Symmetrical, normal in size, without palpable masses or nodularity. Respiratory  Effort:  Normal  Auscultation:  Clear without wheezing or rhonchi Cardiovascular  Auscultation:  Regular rate, without rubs, murmurs or gallops  Edema/varicosities:  Not grossly evident Abdominal  Soft,nontender, without masses, guarding or rebound.  Liver/spleen:  No organomegaly noted  Hernia:  None appreciated  Skin  Inspection:  Grossly normal  Palpation:  Grossly normal Neurologic/psychiatric  Orientation:  Normal with appropriate conversation.  Mood/affect:  Normal  Genitourinary    Breasts: Examined lying and sitting.     Right: Without masses, retractions, discharge or axillary adenopathy.     Left: Without masses, retractions, discharge or axillary adenopathy.   Inguinal/mons:  Normal without inguinal adenopathy  External genitalia:  Normal  BUS/Urethra/Skene's glands:  Normal  Bladder:  Normal  Vagina:  Normal  Cervix:  Absent  Uterus: Absent Adnexa/parametria:     Rt: Without masses or tenderness.   Lt: Without masses  or tenderness.  Anus and perineum: Normal  Digital rectal exam: Normal sphincter tone without palpated masses or tenderness  Assessment/Plan:  67 y.o. [name] G3 P3 for breast and pelvic exam without complaint.  TVH with A&P repair on HRT Hypercholesteremia primary care manages labs and meds  Plan: Premarin 0.3 prescription, proper use given and reviewed will continue to decrease frequency of taking. Risks of blood clots, strokes, breast cancer reviewed. DEXA will repeat this year, reviewed importance of continuing regular exercise, calcium rich diet, vitamin D 2000 daily. SBE's, continue annual mammogram. Pneumovax vaccine will get at primary care.   Harrington Challenger [facility], 5:39 PM 11/03/2012

## 2012-11-03 NOTE — Patient Instructions (Signed)
Health Recommendations for Postmenopausal Women Respected and ongoing research has looked at the most common causes of death, disability, and poor quality of life in postmenopausal women. The causes include heart disease, diseases of blood vessels, diabetes, depression, cancer, and bone loss (osteoporosis). Many things can be done to help lower the chances of developing these and other common problems: CARDIOVASCULAR DISEASE Heart Disease: A heart attack is a medical emergency. Know the signs and symptoms of a heart attack. Below are things women can do to reduce their risk for heart disease.   Do not smoke. If you smoke, quit.  Aim for a healthy weight. Being overweight causes many preventable deaths. Eat a healthy and balanced diet and drink an adequate amount of liquids.  Get moving. Make a commitment to be more physically active. Aim for 30 minutes of activity on most, if not all days of the week.  Eat for heart health. Choose a diet that is low in saturated fat and cholesterol and eliminate trans fat. Include whole grains, vegetables, and fruits. Read and understand the labels on food containers before buying.  Know your numbers. Ask your caregiver to check your blood pressure, cholesterol (total, HDL, LDL, triglycerides) and blood glucose. Work with your caregiver on improving your entire clinical picture.  High blood pressure. Limit or stop your table salt intake (try salt substitute and food seasonings). Avoid salty foods and drinks. Read labels on food containers before buying. Eating well and exercising can help control high blood pressure. STROKE  Stroke is a medical emergency. Stroke may be the result of a blood clot in a blood vessel in the brain or by a brain hemorrhage (bleeding). Know the signs and symptoms of a stroke. To lower the risk of developing a stroke:  Avoid fatty foods.  Quit smoking.  Control your diabetes, blood pressure, and irregular heart rate. THROMBOPHLEBITIS  (BLOOD CLOT) OF THE LEG  Becoming overweight and leading a stationary lifestyle may also contribute to developing blood clots. Controlling your diet and exercising will help lower the risk of developing blood clots. CANCER SCREENING  Breast Cancer: Take steps to reduce your risk of breast cancer.  You should practice "breast self-awareness." This means understanding the normal appearance and feel of your breasts and should include breast self-examination. Any changes detected, no matter how small, should be reported to your caregiver.  After age 40, you should have a clinical breast exam (CBE) every year.  Starting at age 40, you should consider having a mammogram (breast X-ray) every year.  If you have a family history of breast cancer, talk to your caregiver about genetic screening.  If you are at high risk for breast cancer, talk to your caregiver about having an MRI and a mammogram every year.  Intestinal or Stomach Cancer: Tests to consider are a rectal exam, fecal occult blood, sigmoidoscopy, and colonoscopy. Women who are high risk may need to be screened at an earlier age and more often.  Cervical Cancer:  Beginning at age 30, you should have a Pap test every 3 years as long as the past 3 Pap tests have been normal.  If you have had past treatment for cervical cancer or a condition that could lead to cancer, you need Pap tests and screening for cancer for at least 20 years after your treatment.  If you had a hysterectomy for a problem that was not cancer or a condition that could lead to cancer, then you no longer need Pap tests.    If you are between ages 65 and 70, and you have had normal Pap tests going back 10 years, you no longer need Pap tests.  If Pap tests have been discontinued, risk factors (such as a new sexual partner) need to be reassessed to determine if screening should be resumed.  Some medical problems can increase the chance of getting cervical cancer. In these  cases, your caregiver may recommend more frequent screening and Pap tests.  Uterine Cancer: If you have vaginal bleeding after reaching menopause, you should notify your caregiver.  Ovarian cancer: Other than yearly pelvic exams, there are no reliable tests available to screen for ovarian cancer at this time except for yearly pelvic exams.  Lung Cancer: Yearly chest X-rays can detect lung cancer and should be done on high risk women, such as cigarette smokers and women with chronic lung disease (emphysema).  Skin Cancer: A complete body skin exam should be done at your yearly examination. Avoid overexposure to the sun and ultraviolet light lamps. Use a strong sun block cream when in the sun. All of these things are important in lowering the risk of skin cancer. MENOPAUSE Menopause Symptoms: Hormone therapy products are effective for treating symptoms associated with menopause:  Moderate to severe hot flashes.  Night sweats.  Mood swings.  Headaches.  Tiredness.  Loss of sex drive.  Insomnia.  Other symptoms. Hormone replacement carries certain risks, especially in older women. Women who use or are thinking about using estrogen or estrogen with progestin treatments should discuss that with their caregiver. Your caregiver will help you understand the benefits and risks. The ideal dose of hormone replacement therapy is not known. The Food and Drug Administration (FDA) has concluded that hormone therapy should be used only at the lowest doses and for the shortest amount of time to reach treatment goals.  OSTEOPOROSIS Protecting Against Bone Loss and Preventing Fracture: If you use hormone therapy for prevention of bone loss (osteoporosis), the risks for bone loss must outweigh the risk of the therapy. Ask your caregiver about other medications known to be safe and effective for preventing bone loss and fractures. To guard against bone loss or fractures, the following is recommended:  If  you are less than age 50, take 1000 mg of calcium and at least 600 mg of Vitamin D per day.  If you are greater than age 50 but less than age 70, take 1200 mg of calcium and at least 600 mg of Vitamin D per day.  If you are greater than age 70, take 1200 mg of calcium and at least 800 mg of Vitamin D per day. Smoking and excessive alcohol intake increases the risk of osteoporosis. Eat foods rich in calcium and vitamin D and do weight bearing exercises several times a week as your caregiver suggests. DIABETES Diabetes Melitus: If you have Type I or Type 2 diabetes, you should keep your blood sugar under control with diet, exercise and recommended medication. Avoid too many sweets, starchy and fatty foods. Being overweight can make control more difficult. COGNITION AND MEMORY Cognition and Memory: Menopausal hormone therapy is not recommended for the prevention of cognitive disorders such as Alzheimer's disease or memory loss.  DEPRESSION  Depression may occur at any age, but is common in elderly women. The reasons may be because of physical, medical, social (loneliness), or financial problems and needs. If you are experiencing depression because of medical problems and control of symptoms, talk to your caregiver about this. Physical activity and   exercise may help with mood and sleep. Community and volunteer involvement may help your sense of value and worth. If you have depression and you feel that the problem is getting worse or becoming severe, talk to your caregiver about treatment options that are best for you. ACCIDENTS  Accidents are common and can be serious in the elderly woman. Prepare your house to prevent accidents. Eliminate throw rugs, place hand bars in the bath, shower and toilet areas. Avoid wearing high heeled shoes or walking on wet, snowy, and icy areas. Limit or stop driving if you have vision or hearing problems, or you feel you are unsteady with you movements and  reflexes. HEPATITIS C Hepatitis C is a type of viral infection affecting the liver. It is spread mainly through contact with blood from an infected person. It can be treated, but if left untreated, it can lead to severe liver damage over years. Many people who are infected do not know that the virus is in their blood. If you are a "baby-boomer", it is recommended that you have one screening test for Hepatitis C. IMMUNIZATIONS  Several immunizations are important to consider having during your senior years, including:   Tetanus, diptheria, and pertussis booster shot.  Influenza every year before the flu season begins.  Pneumonia vaccine.  Shingles vaccine.  Others as indicated based on your specific needs. Talk to your caregiver about these. Document Released: 04/04/2005 Document Revised: 01/28/2012 Document Reviewed: 11/29/2007 [employer] Patient Information 2014 [employer].  

## 2012-11-04 ENCOUNTER — Encounter: Payer: Self-pay | Admitting: Obstetrics and Gynecology

## 2012-11-11 ENCOUNTER — Other Ambulatory Visit: Payer: Self-pay | Admitting: Gynecology

## 2012-11-11 DIAGNOSIS — Z79890 Hormone replacement therapy (postmenopausal): Secondary | ICD-10-CM

## 2012-11-12 ENCOUNTER — Other Ambulatory Visit: Payer: Self-pay | Admitting: Women's Health

## 2012-11-12 NOTE — Telephone Encounter (Signed)
Please call, review we do not prescribe antihypertensives, best to be done through primary care, Rx refilled x1.

## 2012-11-16 NOTE — Telephone Encounter (Signed)
Pt will have PCP start filling medication.

## 2012-11-24 DIAGNOSIS — M8580 Other specified disorders of bone density and structure, unspecified site: Secondary | ICD-10-CM

## 2012-11-24 HISTORY — DX: Other specified disorders of bone density and structure, unspecified site: M85.80

## 2012-12-14 ENCOUNTER — Emergency Department (HOSPITAL_COMMUNITY): Payer: PRIVATE HEALTH INSURANCE

## 2012-12-14 ENCOUNTER — Emergency Department (HOSPITAL_COMMUNITY)
Admission: EM | Admit: 2012-12-14 | Discharge: 2012-12-14 | Disposition: A | Discharge status: Home / Self Care | Payer: PRIVATE HEALTH INSURANCE | Attending: Emergency Medicine | Admitting: Emergency Medicine

## 2012-12-14 ENCOUNTER — Encounter (HOSPITAL_COMMUNITY): Payer: Self-pay | Admitting: Emergency Medicine

## 2012-12-14 DIAGNOSIS — R1013 Epigastric pain: Secondary | ICD-10-CM

## 2012-12-14 DIAGNOSIS — Z8719 Personal history of other diseases of the digestive system: Secondary | ICD-10-CM

## 2012-12-14 DIAGNOSIS — K219 Gastro-esophageal reflux disease without esophagitis: Secondary | ICD-10-CM | POA: Insufficient documentation

## 2012-12-14 DIAGNOSIS — Z872 Personal history of diseases of the skin and subcutaneous tissue: Secondary | ICD-10-CM | POA: Insufficient documentation

## 2012-12-14 DIAGNOSIS — M129 Arthropathy, unspecified: Secondary | ICD-10-CM | POA: Insufficient documentation

## 2012-12-14 DIAGNOSIS — E7800 Pure hypercholesterolemia, unspecified: Secondary | ICD-10-CM | POA: Insufficient documentation

## 2012-12-14 DIAGNOSIS — Z79899 Other long term (current) drug therapy: Secondary | ICD-10-CM | POA: Insufficient documentation

## 2012-12-14 DIAGNOSIS — Z8619 Personal history of other infectious and parasitic diseases: Secondary | ICD-10-CM | POA: Insufficient documentation

## 2012-12-14 DIAGNOSIS — I10 Essential (primary) hypertension: Secondary | ICD-10-CM | POA: Insufficient documentation

## 2012-12-14 DIAGNOSIS — R112 Nausea with vomiting, unspecified: Secondary | ICD-10-CM | POA: Insufficient documentation

## 2012-12-14 DIAGNOSIS — R1012 Left upper quadrant pain: Secondary | ICD-10-CM | POA: Insufficient documentation

## 2012-12-14 DIAGNOSIS — F411 Generalized anxiety disorder: Secondary | ICD-10-CM | POA: Insufficient documentation

## 2012-12-14 LAB — CBC WITH DIFFERENTIAL/PLATELET
Basophils Absolute: 0 10*3/uL (ref 0.0–0.1)
Eosinophils Relative: 1 % (ref 0–5)
Lymphocytes Relative: 24 % (ref 12–46)
Lymphs Abs: 2.1 10*3/uL (ref 0.7–4.0)
Neutro Abs: 5.8 10*3/uL (ref 1.7–7.7)
Neutrophils Relative %: 67 % (ref 43–77)
Platelets: 340 10*3/uL (ref 150–400)
RBC: 4.54 MIL/uL (ref 3.87–5.11)
RDW: 13.9 % (ref 11.5–15.5)
WBC: 8.6 10*3/uL (ref 4.0–10.5)

## 2012-12-14 LAB — COMPREHENSIVE METABOLIC PANEL
ALT: 25 U/L (ref 0–35)
AST: 37 U/L (ref 0–37)
Alkaline Phosphatase: 62 U/L (ref 39–117)
CO2: 28 mEq/L (ref 19–32)
Calcium: 9.8 mg/dL (ref 8.4–10.5)
Chloride: 98 mEq/L (ref 96–112)
GFR calc non Af Amer: 61 mL/min — ABNORMAL LOW (ref >90)
Glucose, Bld: 102 mg/dL — ABNORMAL HIGH (ref 70–99)
Potassium: 4.4 mEq/L (ref 3.5–5.1)
Sodium: 136 mEq/L (ref 135–145)

## 2012-12-14 MED ORDER — DICYCLOMINE HCL 20 MG PO TABS
10.0000 mg | ORAL_TABLET | Freq: Once | ORAL | Status: AC
  Administered 2012-12-14: 10 mg via ORAL
  Filled 2012-12-14 (×2): qty 1

## 2012-12-14 MED ORDER — SODIUM CHLORIDE 0.9 % IV BOLUS (SEPSIS)
500.0000 mL | Freq: Once | INTRAVENOUS | Status: AC
  Administered 2012-12-14: 500 mL via INTRAVENOUS

## 2012-12-14 MED ORDER — DICYCLOMINE HCL 20 MG PO TABS: 20.0000 mg | ORAL_TABLET | Freq: Four times a day (QID) | ORAL | Status: DC

## 2012-12-14 MED ORDER — HYDROMORPHONE HCL PF 1 MG/ML IJ SOLN
0.5000 mg | Freq: Once | INTRAMUSCULAR | Status: AC
  Administered 2012-12-14: 0.5 mg via INTRAVENOUS
  Filled 2012-12-14: qty 1

## 2012-12-14 MED ORDER — FENTANYL CITRATE 0.05 MG/ML IJ SOLN: 50.0000 ug | INTRAMUSCULAR | Status: DC | PRN

## 2012-12-14 MED ORDER — OMEPRAZOLE 20 MG PO CPDR: DELAYED_RELEASE_CAPSULE | ORAL | Status: DC

## 2012-12-14 MED ORDER — GI COCKTAIL ~~LOC~~
30.0000 mL | Freq: Once | ORAL | Status: AC
  Administered 2012-12-14: 30 mL via ORAL
  Filled 2012-12-14: qty 30

## 2012-12-14 MED ORDER — ONDANSETRON HCL 4 MG/2ML IJ SOLN
4.0000 mg | Freq: Once | INTRAMUSCULAR | Status: AC
  Administered 2012-12-14: 4 mg via INTRAVENOUS
  Filled 2012-12-14: qty 2

## 2012-12-14 MED ORDER — PANTOPRAZOLE SODIUM 40 MG IV SOLR
40.0000 mg | Freq: Once | INTRAVENOUS | Status: AC
  Administered 2012-12-14: 40 mg via INTRAVENOUS
  Filled 2012-12-14: qty 40

## 2012-12-14 NOTE — ED Provider Notes (Signed)
CSN: [id number]     Arrival date & time 12/14/12  0036 History   First MD Initiated Contact with Patient 12/14/12 0140     Chief Complaint  Patient presents with  . Abdominal Pain  . Emesis   (Consider location/radiation/quality/duration/timing/severity/associated sxs/prior Treatment) Patient is a 67 y.o. female presenting with abdominal pain and vomiting. The history is provided by the patient and the spouse. No language interpreter was used.  Abdominal Pain Pain location:  Epigastric and LUQ Pain quality: sharp   Pain radiates to:  Does not radiate Associated symptoms: nausea and vomiting   Associated symptoms: no chest pain, no chills, no diarrhea, no fever and no shortness of breath   Associated symptoms comment:  Epigastric and LUQ abdominal pain that started this evening. No symptoms earlier in the day. She has had similar symptoms in the past, diagnosed with GERD and is prescribed Prilosec. She has not been taking the Prilosec as much lately because she has been feeling well and without symptoms. No SOB, chest pain or diarrhea.  Emesis Associated symptoms: abdominal pain   Associated symptoms: no chills and no diarrhea     Past Medical History  Diagnosis Date  . High cholesterol   . Arthritis     finger  . Eczema     elbow  . Hypertension     under control, has been on med. x 10 yrs.  . Dental crowns present   . Mass of hand 05/2011    right  . Pancreatitis   . H. pylori infection   . Diverticulosis   . Allergic rhinitis   . Anxiety    Past Surgical History  Procedure Laterality Date  . Varicose vein surgery  02/13/2003    ligation and stripping left greater saphenous; exc.  multiple varicosities  . Vaginal hysterectomy with a&p repair  1989  . Cataract extraction w/ intraocular lens  implant, bilateral  09/2010, 01/2011    bilateral  . Knee arthroscopy  02/12/2011    Procedure: ARTHROSCOPY KNEE;  Surgeon: Loanne Drilling;  Location: [facility];   Service: Orthopedics;  Laterality: Left;  . Meniscus debridement  02/12/2011    Procedure: DEBRIDEMENT OF MENISCUS;  Surgeon: Loanne Drilling;  Location: [facility];  Service: Orthopedics;  Laterality: Left;  . Chondroplasty  02/12/2011    Procedure: CHONDROPLASTY;  Surgeon: Loanne Drilling;  Location: [facility];  Service: Orthopedics;  Laterality: Left;  Marland Kitchen Mass excision  06/05/2011    Procedure: EXCISION MASS;  Surgeon: Sharma Covert, MD;  Location: [facility];  Service: Orthopedics;  Laterality: Right;  right thumb  . Eye surgery      laser right eye; due to right eye hemorrhage; had left eye retinal tear with subsequent surgery  . Mouth surgery    . Tubal ligation    . Ablation saphenous vein w/ rfa  10-2011  . Cholecystectomy  MAY 2014  . Stent placement after cholecestectomy     Family History  Problem Relation Age of Onset  . Diabetes Mother   . Hypertension Mother   . Heart disease Mother   . Hypertension Father   . Cervical cancer Sister   . Heart disease Brother   . Lung cancer Brother   . Throat cancer Brother   . Colon cancer Mother 73  . Colon polyps Neg Hx    History  Substance Use Topics  . Smoking status: Never Smoker   . Smokeless  tobacco: Never Used  . Alcohol Use: 0.0 oz/week     Comment: occasionally-Rare   OB History   Grav Para Term Preterm Abortions TAB SAB Ect Mult Living   3 3 3       3      Review of Systems  Constitutional: Negative for fever and chills.  Respiratory: Negative.  Negative for shortness of breath.   Cardiovascular: Negative.  Negative for chest pain.  Gastrointestinal: Positive for nausea, vomiting and abdominal pain. Negative for diarrhea and blood in stool.  Genitourinary: Negative.   Neurological: Negative.     Allergies  Nitrofurantoin monohyd macro  Home Medications   Current Outpatient Rx  Name  Route  Sig  Dispense  Refill  . acetaminophen (TYLENOL) 500 MG tablet    Oral   Take 500 mg by mouth every 6 (six) hours as needed for pain.         Marland Kitchen ALPRAZolam (XANAX) 0.25 MG tablet   Oral   Take 0.25 mg by mouth at bedtime as needed. For anxiety.         Marland Kitchen estrogens, conjugated, (PREMARIN) 0.3 MG tablet   Oral   Take 0.3 mg by mouth every 7 (seven) days. Friday.         . loratadine (CLARITIN) 10 MG tablet   Oral   Take 10 mg by mouth every morning.         . Multiple Vitamin (MULTIVITAMIN) tablet   Oral   Take 1 tablet by mouth every morning.          Marland Kitchen omeprazole (PRILOSEC) 20 MG capsule   Oral   Take 1 capsule (20 mg total) by mouth daily.   30 capsule   3   . rosuvastatin (CRESTOR) 10 MG tablet   Oral   Take 5 mg by mouth at bedtime.          . traMADol (ULTRAM) 50 MG tablet   Oral   Take 50 mg by mouth 3 (three) times daily as needed for pain.         Marland Kitchen triamterene-hydrochlorothiazide (DYAZIDE) 37.5-25 MG per capsule      TAKE 1 EACH (1 CAPSULE TOTAL) BY MOUTH EVERY MORNING.   30 capsule   0    BP 126/76  Pulse 77  Temp(Src) 98.3 F (36.8 C) (Oral)  Resp 20  Ht 5\' 3"  (1.6 m)  Wt 150 lb (68.04 kg)  BMI 26.58 kg/m2  SpO2 94% Physical Exam  Constitutional: She is oriented to person, place, and time. She appears well-developed and well-nourished.  HENT:  Head: Normocephalic.  Neck: Normal range of motion. Neck supple.  Cardiovascular: Normal rate and regular rhythm.   Pulmonary/Chest: Effort normal and breath sounds normal. She has no wheezes. She has no rales.  Abdominal: Soft. Bowel sounds are normal. There is tenderness. There is no rebound and no guarding.  Mild epigastric and LUQ abdominal tenderness without mass.  Musculoskeletal: Normal range of motion.  Neurological: She is alert and oriented to person, place, and time.  Skin: Skin is warm and dry. No rash noted.  Psychiatric: She has a normal mood and affect.    ED Course  Procedures (including critical care time) Labs Review Labs Reviewed   COMPREHENSIVE METABOLIC PANEL - Abnormal; Notable for the following:    Glucose, Bld 102 (*)    GFR calc non Af Amer 61 (*)    GFR calc Af Amer 70 (*)    All other components  within normal limits  CBC WITH DIFFERENTIAL  LIPASE, BLOOD   Results for orders placed during the hospital encounter of 12/14/12  CBC WITH DIFFERENTIAL      Result Value Range   WBC 8.6  4.0 - 10.5 K/uL   RBC 4.54  3.87 - 5.11 MIL/uL   Hemoglobin 14.0  12.0 - 15.0 g/dL   HCT 16.1  09.6 - 04.5 %   MCV 90.3  78.0 - 100.0 fL   MCH 30.8  26.0 - 34.0 pg   MCHC 34.1  30.0 - 36.0 g/dL   RDW 40.9  81.1 - 91.4 %   Platelets 340  150 - 400 K/uL   Neutrophils Relative % 67  43 - 77 %   Neutro Abs 5.8  1.7 - 7.7 K/uL   Lymphocytes Relative 24  12 - 46 %   Lymphs Abs 2.1  0.7 - 4.0 K/uL   Monocytes Relative 8  3 - 12 %   Monocytes Absolute 0.7  0.1 - 1.0 K/uL   Eosinophils Relative 1  0 - 5 %   Eosinophils Absolute 0.1  0.0 - 0.7 K/uL   Basophils Relative 0  0 - 1 %   Basophils Absolute 0.0  0.0 - 0.1 K/uL  COMPREHENSIVE METABOLIC PANEL      Result Value Range   Sodium 136  135 - 145 mEq/L   Potassium 4.4  3.5 - 5.1 mEq/L   Chloride 98  96 - 112 mEq/L   CO2 28  19 - 32 mEq/L   Glucose, Bld 102 (*) 70 - 99 mg/dL   BUN 20  6 - 23 mg/dL   Creatinine, Ser 7.82  0.50 - 1.10 mg/dL   Calcium 9.8  8.4 - 95.6 mg/dL   Total Protein 7.3  6.0 - 8.3 g/dL   Albumin 4.0  3.5 - 5.2 g/dL   AST 37  0 - 37 U/L   ALT 25  0 - 35 U/L   Alkaline Phosphatase 62  39 - 117 U/L   Total Bilirubin 0.6  0.3 - 1.2 mg/dL   GFR calc non Af Amer 61 (*) >90 mL/min   GFR calc Af Amer 70 (*) >90 mL/min  LIPASE, BLOOD      Result Value Range   Lipase 34  11 - 59 U/L   Dg Abd Acute W/chest  12/14/2012   CLINICAL DATA:  Sudden onset of upper abdominal pain and cramping.  EXAM: ACUTE ABDOMEN SERIES (ABDOMEN 2 VIEW & CHEST 1 VIEW)  COMPARISON:  Abdominal series 1119 2011. CT abdomen and pelvis 05/19/2012  FINDINGS: There is no evidence of  dilated bowel loops or free intraperitoneal air. No radiopaque calculi or other significant radiographic abnormality is seen. Heart size and mediastinal contours are within normal limits. Both lungs are clear.  IMPRESSION: Negative abdominal radiographs.  No acute cardiopulmonary disease.   Electronically Signed   By: Burman Nieves M.D.   On: 12/14/2012 03:57   Imaging Review No results found.  EKG Interpretation   None       MDM  No diagnosis found. 1. Abdominal pain  Non-tender in RUQ initially as well as on re-examination. Normal lab studies. She recently went off her Prilosec and I suspect this is causing her to be symptomatic. stble for discharge.    Arnoldo Hooker, PA-C 12/16/12 2012

## 2012-12-14 NOTE — ED Notes (Signed)
Pt states about 2 hrs after eating supper she became bloated and very uncomfortable   Pt states she has had vomiting since  Pt states she took omeprazole but ended up vomiting it up   Pt states she has taken pain medication but it has not helped  Pt states she still feels bloated and if she tries to drink anything then she becomes nauseated again

## 2012-12-18 NOTE — ED Provider Notes (Signed)
Medical screening examination/treatment/procedure(s) were performed by non-physician practitioner and as supervising physician I was immediately available for consultation/collaboration.    Sunnie Nielsen, MD 12/18/12 0600

## 2012-12-23 ENCOUNTER — Ambulatory Visit (INDEPENDENT_AMBULATORY_CARE_PROVIDER_SITE_OTHER): Payer: Medicare Other

## 2012-12-23 DIAGNOSIS — Z79890 Hormone replacement therapy (postmenopausal): Secondary | ICD-10-CM

## 2012-12-23 DIAGNOSIS — M899 Disorder of bone, unspecified: Secondary | ICD-10-CM

## 2012-12-23 DIAGNOSIS — M8580 Other specified disorders of bone density and structure, unspecified site: Secondary | ICD-10-CM

## 2012-12-27 ENCOUNTER — Encounter: Payer: Self-pay | Admitting: Gynecology

## 2012-12-30 ENCOUNTER — Telehealth: Payer: Self-pay | Admitting: *Deleted

## 2012-12-30 NOTE — Telephone Encounter (Signed)
Pt informed with recent dexa results.

## 2012-12-30 NOTE — Telephone Encounter (Signed)
Pt called requesting recent bone density results, Left message for pt to call.

## 2013-03-16 ENCOUNTER — Other Ambulatory Visit: Payer: Self-pay | Admitting: Internal Medicine

## 2013-03-16 DIAGNOSIS — R1012 Left upper quadrant pain: Secondary | ICD-10-CM

## 2013-03-22 ENCOUNTER — Ambulatory Visit
Admission: RE | Admit: 2013-03-22 | Discharge: 2013-03-22 | Disposition: A | Discharge status: Home / Self Care | Payer: PRIVATE HEALTH INSURANCE | Source: Ambulatory Visit | Attending: Internal Medicine | Admitting: Internal Medicine

## 2013-03-22 DIAGNOSIS — R1012 Left upper quadrant pain: Secondary | ICD-10-CM

## 2013-04-27 ENCOUNTER — Ambulatory Visit (INDEPENDENT_AMBULATORY_CARE_PROVIDER_SITE_OTHER): Payer: Medicare Other | Admitting: Gynecology

## 2013-04-27 ENCOUNTER — Encounter: Payer: Self-pay | Admitting: Gynecology

## 2013-04-27 DIAGNOSIS — N899 Noninflammatory disorder of vagina, unspecified: Secondary | ICD-10-CM

## 2013-04-27 DIAGNOSIS — N898 Other specified noninflammatory disorders of vagina: Secondary | ICD-10-CM

## 2013-04-27 DIAGNOSIS — R300 Dysuria: Secondary | ICD-10-CM

## 2013-04-27 DIAGNOSIS — N390 Urinary tract infection, site not specified: Secondary | ICD-10-CM

## 2013-04-27 LAB — URINALYSIS W MICROSCOPIC + REFLEX CULTURE
Bilirubin Urine: NEGATIVE
CASTS: NONE SEEN
Crystals: NONE SEEN
Glucose, UA: NEGATIVE mg/dL
Ketones, ur: NEGATIVE mg/dL
NITRITE: NEGATIVE
PH: 6.5 (ref 5.0–8.0)
Protein, ur: NEGATIVE mg/dL
SPECIFIC GRAVITY, URINE: 1.015 (ref 1.005–1.030)
UROBILINOGEN UA: 0.2 mg/dL (ref 0.0–1.0)

## 2013-04-27 LAB — WET PREP FOR TRICH, YEAST, CLUE
CLUE CELLS WET PREP: NONE SEEN
Trich, Wet Prep: NONE SEEN
YEAST WET PREP: NONE SEEN

## 2013-04-27 MED ORDER — CIPROFLOXACIN HCL 250 MG PO TABS: 250.0000 mg | ORAL_TABLET | Freq: Two times a day (BID) | ORAL | Status: DC

## 2013-04-27 NOTE — Progress Notes (Signed)
Kayla Stokes Aug 21, 1945 [id number]        67 y.o.  G3P3003 presents with several days of dysuria and mild frequency. No urgency abdominal pain or low back pain. No fever or chills. Has been pushing fluids and cranberry juice noting some relief of her symptoms. No vaginal discharge or odor.  Past medical history,surgical history, problem list, medications, allergies, family history and social history were all reviewed and documented in the EPIC chart.  Exam: Kim assistant General appearance  Normal Spine straight without CVA tenderness Abdomen soft nontender without masses guarding rebound or organomegaly Pelvic external BUS vagina with atrophic changes. Slight white discharge noted. Cervix normal. Uterus normal size midline mobile nontender. Adnexa without masses or tenderness.  Assessment/Plan:  68 y.o. G3P3003 symptoms and urinalysis consistent with UTI. Will treat with ciprofloxacin 250 mg twice a day x7 days. Wet prep is negative. Followup if symptoms persist, worsen or recur.   Note: This document was prepared with digital dictation and possible smart phrase technology. Any transcriptional errors that result from this process are unintentional.   Anastasio Auerbach MD, 3:56 PM 04/27/2013

## 2013-04-27 NOTE — Addendum Note (Signed)
Addended by: Nelva Nay on: 04/27/2013 04:29 PM   Modules accepted: Orders

## 2013-04-27 NOTE — Patient Instructions (Signed)
Take antibiotic twice daily for 7 days. Followup if symptoms persist, worsen or recur.  Urinary Tract Infection Urinary tract infections (UTIs) can develop anywhere along your urinary tract. Your urinary tract is your body's drainage system for removing wastes and extra water. Your urinary tract includes two kidneys, two ureters, a bladder, and a urethra. Your kidneys are a pair of bean-shaped organs. Each kidney is about the size of your fist. They are located below your ribs, one on each side of your spine. CAUSES Infections are caused by microbes, which are microscopic organisms, including fungi, viruses, and bacteria. These organisms are so small that they can only be seen through a microscope. Bacteria are the microbes that most commonly cause UTIs. SYMPTOMS  Symptoms of UTIs may vary by age and gender of the patient and by the location of the infection. Symptoms in young women typically include a frequent and intense urge to urinate and a painful, burning feeling in the bladder or urethra during urination. Older women and men are more likely to be tired, shaky, and weak and have muscle aches and abdominal pain. A fever may mean the infection is in your kidneys. Other symptoms of a kidney infection include pain in your back or sides below the ribs, nausea, and vomiting. DIAGNOSIS To diagnose a UTI, your caregiver will ask you about your symptoms. Your caregiver also will ask to provide a urine sample. The urine sample will be tested for bacteria and white blood cells. White blood cells are made by your body to help fight infection. TREATMENT  Typically, UTIs can be treated with medication. Because most UTIs are caused by a bacterial infection, they usually can be treated with the use of antibiotics. The choice of antibiotic and length of treatment depend on your symptoms and the type of bacteria causing your infection. HOME CARE INSTRUCTIONS  If you were prescribed antibiotics, take them exactly  as your caregiver instructs you. Finish the medication even if you feel better after you have only taken some of the medication.  Drink enough water and fluids to keep your urine clear or pale yellow.  Avoid caffeine, tea, and carbonated beverages. They tend to irritate your bladder.  Empty your bladder often. Avoid holding urine for long periods of time.  Empty your bladder before and after sexual intercourse.  After a bowel movement, women should cleanse from front to back. Use each tissue only once. SEEK MEDICAL CARE IF:   You have back pain.  You develop a fever.  Your symptoms do not begin to resolve within 3 days. SEEK IMMEDIATE MEDICAL CARE IF:   You have severe back pain or lower abdominal pain.  You develop chills.  You have nausea or vomiting.  You have continued burning or discomfort with urination. MAKE SURE YOU:   Understand these instructions.  Will watch your condition.  Will get help right away if you are not doing well or get worse. Document Released: 11/20/2004 Document Revised: 08/12/2011 Document Reviewed: 03/21/2011 [facility] Patient Information 2014 [place].

## 2013-04-30 LAB — URINE CULTURE

## 2013-06-15 ENCOUNTER — Encounter: Payer: Self-pay | Admitting: Internal Medicine

## 2013-08-04 ENCOUNTER — Telehealth: Payer: Self-pay

## 2013-08-04 MED ORDER — ESTROGENS CONJUGATED 0.3 MG PO TABS: ORAL_TABLET | ORAL | Status: DC

## 2013-08-04 NOTE — Telephone Encounter (Signed)
She had been on Premarin 0.3 which is the lowert dose. Premarin is usually not covered by [employer], she could try estradiol 0.5 could try to break it in half. Premarin 0.3 , lower risk if feels better on it is okay to call in, review slight risk for blood clots, strokes, breast cancer.Marland Kitchen

## 2013-08-04 NOTE — Telephone Encounter (Signed)
Patient saw you last Sept for CE and has tapered and discontinue her Premarin per the plan/recommendation at that visit. However, the last couple of months she has started having hotflashes again to the point she is losing sleep.  She asked if maybe she should go back on a low dose of Premarin?

## 2013-08-04 NOTE — Telephone Encounter (Signed)
Rx sent 

## 2013-08-04 NOTE — Telephone Encounter (Addendum)
Patient informed . She wants to use the Premarin as it is what she was on before and she know it helps.  She has State Employee Ins in addition to her Medicare so she is not concerned regarding cost of medication.

## 2013-08-05 ENCOUNTER — Telehealth: Payer: Self-pay | Admitting: *Deleted

## 2013-08-05 NOTE — Telephone Encounter (Signed)
Premarin approved through 08/06/2014

## 2013-08-05 NOTE — Telephone Encounter (Signed)
PA faxed to [facility] for premarin 0.3 mg tablets, will wait for response.

## 2013-10-12 ENCOUNTER — Encounter: Payer: Self-pay | Admitting: Women's Health

## 2013-11-30 ENCOUNTER — Ambulatory Visit (INDEPENDENT_AMBULATORY_CARE_PROVIDER_SITE_OTHER): Payer: Medicare Other | Admitting: Women's Health

## 2013-11-30 ENCOUNTER — Encounter: Payer: Self-pay | Admitting: Women's Health

## 2013-11-30 VITALS — BP 132/86 | Ht 63.0 in | Wt 165.0 lb

## 2013-11-30 DIAGNOSIS — Z79890 Hormone replacement therapy (postmenopausal): Secondary | ICD-10-CM

## 2013-11-30 MED ORDER — ESTROGENS CONJUGATED 0.3 MG PO TABS: ORAL_TABLET | ORAL | Status: DC

## 2013-11-30 NOTE — Patient Instructions (Signed)
Health Recommendations for Postmenopausal Women Respected and ongoing research has looked at the most common causes of death, disability, and poor quality of life in postmenopausal women. The causes include heart disease, diseases of blood vessels, diabetes, depression, cancer, and bone loss (osteoporosis). Many things can be done to help lower the chances of developing these and other common problems. CARDIOVASCULAR DISEASE Heart Disease: A heart attack is a medical emergency. Know the signs and symptoms of a heart attack. Below are things women can do to reduce their risk for heart disease.   Do not smoke. If you smoke, quit.  Aim for a healthy weight. Being overweight causes many preventable deaths. Eat a healthy and balanced diet and drink an adequate amount of liquids.  Get moving. Make a commitment to be more physically active. Aim for 30 minutes of activity on most, if not all days of the week.  Eat for heart health. Choose a diet that is low in saturated fat and cholesterol and eliminate trans fat. Include whole grains, vegetables, and fruits. Read and understand the labels on food containers before buying.  Know your numbers. Ask your caregiver to check your blood pressure, cholesterol (total, HDL, LDL, triglycerides) and blood glucose. Work with your caregiver on improving your entire clinical picture.  High blood pressure. Limit or stop your table salt intake (try salt substitute and food seasonings). Avoid salty foods and drinks. Read labels on food containers before buying. Eating well and exercising can help control high blood pressure. STROKE  Stroke is a medical emergency. Stroke may be the result of a blood clot in a blood vessel in the brain or by a brain hemorrhage (bleeding). Know the signs and symptoms of a stroke. To lower the risk of developing a stroke:  Avoid fatty foods.  Quit smoking.  Control your diabetes, blood pressure, and irregular heart rate. THROMBOPHLEBITIS  (BLOOD CLOT) OF THE LEG  Becoming overweight and leading a stationary lifestyle may also contribute to developing blood clots. Controlling your diet and exercising will help lower the risk of developing blood clots. CANCER SCREENING  Breast Cancer: Take steps to reduce your risk of breast cancer.  You should practice "breast self-awareness." This means understanding the normal appearance and feel of your breasts and should include breast self-examination. Any changes detected, no matter how small, should be reported to your caregiver.  After age 40, you should have a clinical breast exam (CBE) every year.  Starting at age 40, you should consider having a mammogram (breast X-ray) every year.  If you have a family history of breast cancer, talk to your caregiver about genetic screening.  If you are at high risk for breast cancer, talk to your caregiver about having an MRI and a mammogram every year.  Intestinal or Stomach Cancer: Tests to consider are a rectal exam, fecal occult blood, sigmoidoscopy, and colonoscopy. Women who are high risk may need to be screened at an earlier age and more often.  Cervical Cancer:  Beginning at age 30, you should have a Pap test every 3 years as long as the past 3 Pap tests have been normal.  If you have had past treatment for cervical cancer or a condition that could lead to cancer, you need Pap tests and screening for cancer for at least 20 years after your treatment.  If you had a hysterectomy for a problem that was not cancer or a condition that could lead to cancer, then you no longer need Pap tests.    If you are between ages 65 and 70, and you have had normal Pap tests going back 10 years, you no longer need Pap tests.  If Pap tests have been discontinued, risk factors (such as a new sexual partner) need to be reassessed to determine if screening should be resumed.  Some medical problems can increase the chance of getting cervical cancer. In these  cases, your caregiver may recommend more frequent screening and Pap tests.  Uterine Cancer: If you have vaginal bleeding after reaching menopause, you should notify your caregiver.  Ovarian Cancer: Other than yearly pelvic exams, there are no reliable tests available to screen for ovarian cancer at this time except for yearly pelvic exams.  Lung Cancer: Yearly chest X-rays can detect lung cancer and should be done on high risk women, such as cigarette smokers and women with chronic lung disease (emphysema).  Skin Cancer: A complete body skin exam should be done at your yearly examination. Avoid overexposure to the sun and ultraviolet light lamps. Use a strong sun block cream when in the sun. All of these things are important for lowering the risk of skin cancer. MENOPAUSE Menopause Symptoms: Hormone therapy products are effective for treating symptoms associated with menopause:  Moderate to severe hot flashes.  Night sweats.  Mood swings.  Headaches.  Tiredness.  Loss of sex drive.  Insomnia.  Other symptoms. Hormone replacement carries certain risks, especially in older women. Women who use or are thinking about using estrogen or estrogen with progestin treatments should discuss that with their caregiver. Your caregiver will help you understand the benefits and risks. The ideal dose of hormone replacement therapy is not known. The Food and Drug Administration (FDA) has concluded that hormone therapy should be used only at the lowest doses and for the shortest amount of time to reach treatment goals.  OSTEOPOROSIS Protecting Against Bone Loss and Preventing Fracture If you use hormone therapy for prevention of bone loss (osteoporosis), the risks for bone loss must outweigh the risk of the therapy. Ask your caregiver about other medications known to be safe and effective for preventing bone loss and fractures. To guard against bone loss or fractures, the following is recommended:  If  you are younger than age 50, take 1000 mg of calcium and at least 600 mg of Vitamin D per day.  If you are older than age 50 but younger than age 70, take 1200 mg of calcium and at least 600 mg of Vitamin D per day.  If you are older than age 70, take 1200 mg of calcium and at least 800 mg of Vitamin D per day. Smoking and excessive alcohol intake increases the risk of osteoporosis. Eat foods rich in calcium and vitamin D and do weight bearing exercises several times a week as your caregiver suggests. DIABETES Diabetes Mellitus: If you have type I or type 2 diabetes, you should keep your blood sugar under control with diet, exercise, and recommended medication. Avoid starchy and fatty foods, and too many sweets. Being overweight can make diabetes control more difficult. COGNITION AND MEMORY Cognition and Memory: Menopausal hormone therapy is not recommended for the prevention of cognitive disorders such as Alzheimer's disease or memory loss.  DEPRESSION  Depression may occur at any age, but it is common in elderly women. This may be because of physical, medical, social (loneliness), or financial problems and needs. If you are experiencing depression because of medical problems and control of symptoms, talk to your caregiver about this. Physical   activity and exercise may help with mood and sleep. Community and volunteer involvement may improve your sense of value and worth. If you have depression and you feel that the problem is getting worse or becoming severe, talk to your caregiver about which treatment options are best for you. ACCIDENTS  Accidents are common and can be serious in elderly woman. Prepare your house to prevent accidents. Eliminate throw rugs, place hand bars in bath, shower, and toilet areas. Avoid wearing high heeled shoes or walking on wet, snowy, and icy areas. Limit or stop driving if you have vision or hearing problems, or if you feel you are unsteady with your movements and  reflexes. HEPATITIS C Hepatitis C is a type of viral infection affecting the liver. It is spread mainly through contact with blood from an infected person. It can be treated, but if left untreated, it can lead to severe liver damage over the years. Many people who are infected do not know that the virus is in their blood. If you are a "baby-boomer", it is recommended that you have one screening test for Hepatitis C. IMMUNIZATIONS  Several immunizations are important to consider having during your senior years, including:   Tetanus, diphtheria, and pertussis booster shot.  Influenza every year before the flu season begins.  Pneumonia vaccine.  Shingles vaccine.  Others, as indicated based on your specific needs. Talk to your caregiver about these. Document Released: 04/04/2005 Document Revised: 06/27/2013 Document Reviewed: 11/29/2007 [employer] Patient Information 2015 [employer]. This information is not intended to replace advice given to you by your health care provider. Make sure you discuss any questions you have with your health care provider.  

## 2013-11-30 NOTE — Progress Notes (Signed)
Kayla Stokes 11-Sep-1962 [id number]    History:    Presents for breast and pelvic exam.  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 Normal Pap and mammogram history. 2014 DEXA T score 0.2, FRAX 6.2%/0.1%. Current on immunizations. Colonoscopy scheduled end of this month. Hypercholesteremia/hypertension managed by primary care.  Past medical history, past surgical history, family history and social history were all reviewed and documented in the EPIC chart. Retired Public relations account executive. 2013 pancreatitis. Cholecystectomy 2013. Mother diabetes and heart disease, parents hypertension.  ROS:  A  12 point ROS was performed and pertinent positives and negatives are included.  Exam:  Filed Vitals:   11/30/13 1418  BP: 132/86    General appearance:  Normal Thyroid:  Symmetrical, normal in size, without palpable masses or nodularity. Respiratory  Auscultation:  Clear without wheezing or rhonchi Cardiovascular  Auscultation:  Regular rate, without rubs, murmurs or gallops  Edema/varicosities:  Not grossly evident Abdominal  Soft,nontender, without masses, guarding or rebound.  Liver/spleen:  No organomegaly noted  Hernia:  None appreciated  Skin  Inspection:  Grossly normal   Breasts: Examined lying and sitting.     Right: Without masses, retractions, discharge or axillary adenopathy.     Left: Without masses, retractions, discharge or axillary adenopathy. Gentitourinary   Inguinal/mons:  Normal without inguinal adenopathy  External genitalia:  Normal  BUS/Urethra/Skene's glands:  Normal  Vagina:  Normal  Cervix:  Normal  Uterus:   normal in size, shape and contour.  Midline and mobile  Adnexa/parametria:     Rt: Without masses or tenderness.   Lt: Without masses or tenderness.  Anus and perineum: Normal  Digital rectal exam: Normal sphincter tone without palpated masses or tenderness  Assessment/Plan:  68 y.o.  for breast and pelvic exam.  TVH on HRT 2014  normal DEXA without elevated FRAX Hypercholesteremia/hypertension-primary care manages labs and meds  Plan: SBE's, continue annual mammogram, 3-D tomography reviewed and encouraged history of dense breast. Continue regular exercise and active lifestyle, vitamin D 2000 daily. Premarin 0.3 mg will continue taking 2-3 times weekly , reviewed off limits, reviewed risks of blood clots, strokes and breast cancer. Home safety, fall prevention, and importance of continuing regular exercise reviewed.  Huel Cote [facility], 3:31 PM 11/30/2013

## 2013-12-05 ENCOUNTER — Telehealth: Payer: Self-pay | Admitting: *Deleted

## 2013-12-05 MED ORDER — ESTRADIOL 0.5 MG PO TABS: 0.5000 mg | ORAL_TABLET | Freq: Every day | ORAL | Status: DC

## 2013-12-05 NOTE — Telephone Encounter (Signed)
Pt called stating the premarin is too expensive and would like to try the generic estrogen you spoke with her about on OV 11/30/13.

## 2013-12-05 NOTE — Telephone Encounter (Signed)
Rx for estradiol sent to pharmacy.

## 2013-12-05 NOTE — Telephone Encounter (Signed)
Estradiol .5 mg she can break in half if needed - slightly higher dose than .3 Premarin she was on

## 2013-12-26 ENCOUNTER — Encounter: Payer: Self-pay | Admitting: Women's Health

## 2014-03-24 ENCOUNTER — Encounter: Payer: Self-pay | Admitting: Women's Health

## 2014-03-24 ENCOUNTER — Ambulatory Visit (INDEPENDENT_AMBULATORY_CARE_PROVIDER_SITE_OTHER): Payer: Medicare Other | Admitting: Women's Health

## 2014-03-24 VITALS — BP 140/80 | Ht 63.0 in | Wt 165.0 lb

## 2014-03-24 DIAGNOSIS — N3000 Acute cystitis without hematuria: Secondary | ICD-10-CM

## 2014-03-24 DIAGNOSIS — R350 Frequency of micturition: Secondary | ICD-10-CM

## 2014-03-24 LAB — URINALYSIS W MICROSCOPIC + REFLEX CULTURE
BILIRUBIN URINE: NEGATIVE
Casts: NONE SEEN
Crystals: NONE SEEN
Glucose, UA: NEGATIVE mg/dL
Ketones, ur: NEGATIVE mg/dL
Nitrite: NEGATIVE
PH: 7 (ref 5.0–8.0)
PROTEIN: NEGATIVE mg/dL
Specific Gravity, Urine: 1.01 (ref 1.005–1.030)
UROBILINOGEN UA: 0.2 mg/dL (ref 0.0–1.0)

## 2014-03-24 LAB — WET PREP FOR TRICH, YEAST, CLUE
Clue Cells Wet Prep HPF POC: NONE SEEN
Trich, Wet Prep: NONE SEEN
Yeast Wet Prep HPF POC: NONE SEEN

## 2014-03-24 MED ORDER — SULFAMETHOXAZOLE-TRIMETHOPRIM 800-160 MG PO TABS: 1.0000 | ORAL_TABLET | Freq: Two times a day (BID) | ORAL | Status: DC

## 2014-03-24 NOTE — Progress Notes (Signed)
Patient ID: Kayla Stokes, female   DOB: 1945-04-06, 69 y.o.   MRN: [id number] Presents with complaint of increased urgency/frequency/pressure with burning and minimal output for several days. Vaginal burning, with no discharge. Denies abdominal pain or fever. TVH with A&P repair on Estrace 0.5 daily.  Exam: Appears well. External genitalia within normal limits, speculum exam +1 cystocele, no visible discharge or erythema, wet prep negative. Bimanual nontender, suprapubic tenderness. UA: Trace leukocytes, 11-20 WBCs, 0 did 2 RBCs, few bacteria.  UTI  Plan: Septra twice daily for 3 days #6. UTI prevention discussed, instructed to call if no relief of symptoms.

## 2014-03-24 NOTE — Patient Instructions (Signed)

## 2014-03-26 LAB — URINE CULTURE

## 2014-04-26 ENCOUNTER — Ambulatory Visit: Payer: Self-pay | Admitting: Orthopedic Surgery

## 2014-04-26 NOTE — Progress Notes (Signed)
Preoperative surgical orders have been place into the Epic hospital system for Kayla Stokes on 04/26/2014, 5:18 PM  by Mickel Crow for surgery on 05-22-14.  Preop Total Knee orders including Experal, IV Tylenol, and IV Decadron as long as there are no contraindications to the above medications. Arlee Muslim, PA-C

## 2014-04-26 NOTE — Progress Notes (Signed)
Please put orders in Epic surgery 05-22-14 pre op 04-28-14 Thanks

## 2014-05-08 ENCOUNTER — Encounter (HOSPITAL_COMMUNITY): Payer: Self-pay

## 2014-05-08 ENCOUNTER — Encounter (INDEPENDENT_AMBULATORY_CARE_PROVIDER_SITE_OTHER): Payer: Self-pay

## 2014-05-08 ENCOUNTER — Encounter (HOSPITAL_COMMUNITY)
Admission: RE | Admit: 2014-05-08 | Discharge: 2014-05-08 | Disposition: A | Discharge status: Home / Self Care | Payer: Medicare Other | Source: Ambulatory Visit | Attending: Orthopedic Surgery | Admitting: Orthopedic Surgery

## 2014-05-08 DIAGNOSIS — Z01812 Encounter for preprocedural laboratory examination: Secondary | ICD-10-CM | POA: Diagnosis not present

## 2014-05-08 DIAGNOSIS — Z01810 Encounter for preprocedural cardiovascular examination: Secondary | ICD-10-CM | POA: Insufficient documentation

## 2014-05-08 HISTORY — DX: Other bursitis of hip, right hip: M70.71

## 2014-05-08 HISTORY — DX: Adverse effect of unspecified anesthetic, initial encounter: T41.45XA

## 2014-05-08 HISTORY — DX: Personal history of other diseases of the digestive system: Z87.19

## 2014-05-08 HISTORY — DX: Measles without complication: B05.9

## 2014-05-08 HISTORY — DX: Gastro-esophageal reflux disease without esophagitis: K21.9

## 2014-05-08 HISTORY — DX: Rubella without complication: B06.9

## 2014-05-08 HISTORY — DX: Other complications of anesthesia, initial encounter: T88.59XA

## 2014-05-08 HISTORY — DX: Mumps without complication: B26.9

## 2014-05-08 HISTORY — DX: Trochanteric bursitis, left hip: M70.62

## 2014-05-08 LAB — CBC
HEMATOCRIT: 41 % (ref 36.0–46.0)
HEMOGLOBIN: 13.6 g/dL (ref 12.0–15.0)
MCH: 30.8 pg (ref 26.0–34.0)
MCHC: 33.2 g/dL (ref 30.0–36.0)
MCV: 92.8 fL (ref 78.0–100.0)
Platelets: 317 10*3/uL (ref 150–400)
RBC: 4.42 MIL/uL (ref 3.87–5.11)
RDW: 14 % (ref 11.5–15.5)
WBC: 6.8 10*3/uL (ref 4.0–10.5)

## 2014-05-08 LAB — COMPREHENSIVE METABOLIC PANEL
ALBUMIN: 4.5 g/dL (ref 3.5–5.2)
ALT: 44 U/L — AB (ref 0–35)
AST: 40 U/L — ABNORMAL HIGH (ref 0–37)
Alkaline Phosphatase: 76 U/L (ref 39–117)
Anion gap: 9 (ref 5–15)
BUN: 23 mg/dL (ref 6–23)
CALCIUM: 9.4 mg/dL (ref 8.4–10.5)
CO2: 31 mmol/L (ref 19–32)
Chloride: 98 mmol/L (ref 96–112)
Creatinine, Ser: 1.12 mg/dL — ABNORMAL HIGH (ref 0.50–1.10)
GFR, EST AFRICAN AMERICAN: 58 mL/min — AB (ref >90)
GFR, EST NON AFRICAN AMERICAN: 50 mL/min — AB (ref >90)
GLUCOSE: 99 mg/dL (ref 70–99)
Potassium: 4 mmol/L (ref 3.5–5.1)
SODIUM: 138 mmol/L (ref 135–145)
TOTAL PROTEIN: 7.7 g/dL (ref 6.0–8.3)
Total Bilirubin: 1 mg/dL (ref 0.3–1.2)

## 2014-05-08 LAB — URINALYSIS, ROUTINE W REFLEX MICROSCOPIC
Bilirubin Urine: NEGATIVE
Glucose, UA: NEGATIVE mg/dL
Hgb urine dipstick: NEGATIVE
Ketones, ur: NEGATIVE mg/dL
LEUKOCYTES UA: NEGATIVE
NITRITE: NEGATIVE
PH: 6.5 (ref 5.0–8.0)
PROTEIN: NEGATIVE mg/dL
Specific Gravity, Urine: 1.022 (ref 1.005–1.030)
Urobilinogen, UA: 0.2 mg/dL (ref 0.0–1.0)

## 2014-05-08 LAB — PROTIME-INR
INR: 0.94 (ref 0.00–1.49)
Prothrombin Time: 12.7 seconds (ref 11.6–15.2)

## 2014-05-08 LAB — SURGICAL PCR SCREEN
MRSA, PCR: NEGATIVE
Staphylococcus aureus: NEGATIVE

## 2014-05-08 LAB — APTT: aPTT: 26 seconds (ref 24–37)

## 2014-05-08 NOTE — Progress Notes (Signed)
Clearance note per chart per Dr Tollie Pizza 01/02/2014

## 2014-05-08 NOTE — Progress Notes (Signed)
CMP results per epic per PAT visit 05/08/2014 routed to Dr Wynelle Link

## 2014-05-08 NOTE — Patient Instructions (Signed)
[place]  05/08/2014   Your procedure is scheduled on:     Monday, May 22, 2014   Report to [facility] Main Entrance and follow signs to  [place] arrive at 7:30 AM.   Call this number if you have problems the morning of surgery [phone] or Presurgical Testing [phone].   Remember:  Do not eat food or drink liquids :After Midnight.  For Living Will and/or Health Care Power Attorney Forms: please provide copy for your medical record, may bring AM of surgery (forms should be already notarized-we do not provide this service).    Take these medicines the morning of surgery with A SIP OF WATER: Alprazolam (Xanax); Loratadine (Claritin); Omeprazole (Prilosec); Systane eye gtts if needed                               You may not have any metal on your body including hair pins and piercings  Do not wear jewelry, make-up, lotions, powders, prefumes or deodorant.  Do not shave body hair  48 hours(2 days) of CHG soap use.               Do not bring valuables to the hospital. [place].  Contacts, dentures or bridgework may not be worn into surgery.  Leave suitcase in the car. After surgery it may be brought to your room.  For patients admitted to the hospital, checkout time is 11:00 AM the day of discharge.     Special Instructions: review fact sheets for MRSA information, Blood Transfusion fact sheet, Incentive Spirometry. ________________________________________________________________________  [facility] - Preparing for Surgery Before surgery, you can play an important role.  Because skin is not sterile, your skin needs to be as free of germs as possible.  You can reduce the number of germs on your skin by washing with CHG (chlorahexidine gluconate) soap before surgery.  CHG is an antiseptic cleaner which kills germs and bonds with the skin to continue killing germs even after washing. Please DO NOT use if you have an  allergy to CHG or antibacterial soaps.  If your skin becomes reddened/irritated stop using the CHG and inform your nurse when you arrive at Short Stay. Do not shave (including legs and underarms) for at least 48 hours prior to the first CHG shower.  You may shave your face/neck. Please follow these instructions carefully:  1.  Shower with CHG Soap the night before surgery and the  morning of Surgery.  2.  If you choose to wash your hair, wash your hair first as usual with your  normal  shampoo.  3.  After you shampoo, rinse your hair and body thoroughly to remove the  shampoo.                           4.  Use CHG as you would any other liquid soap.  You can apply chg directly  to the skin and wash                       Gently with a scrungie or clean washcloth.  5.  Apply the CHG Soap to your body ONLY FROM THE NECK DOWN.   Do not use on face/ open  Wound or open sores. Avoid contact with eyes, ears mouth and genitals (private parts).                       Wash face,  Genitals (private parts) with your normal soap.             6.  Wash thoroughly, paying special attention to the area where your surgery  will be performed.  7.  Thoroughly rinse your body with warm water from the neck down.  8.  DO NOT shower/wash with your normal soap after using and rinsing off  the CHG Soap.                9.  Pat yourself dry with a clean towel.            10.  Wear clean pajamas.            11.  Place clean sheets on your bed the night of your first shower and do not  sleep with pets. Day of Surgery : Do not apply any lotions/deodorants the morning of surgery.  Please wear clean clothes to the hospital/surgery center.  FAILURE TO FOLLOW THESE INSTRUCTIONS MAY RESULT IN THE CANCELLATION OF YOUR SURGERY PATIENT SIGNATURE_________________________________  NURSE  SIGNATURE__________________________________  ________________________________________________________________________  WHAT IS A BLOOD TRANSFUSION? Blood Transfusion Information  A transfusion is the replacement of blood or some of its parts. Blood is made up of multiple cells which provide different functions.  Red blood cells carry oxygen and are used for blood loss replacement.  White blood cells fight against infection.  Platelets control bleeding.  Plasma helps clot blood.  Other blood products are available for specialized needs, such as hemophilia or other clotting disorders. BEFORE THE TRANSFUSION  Who gives blood for transfusions?   Healthy volunteers who are fully evaluated to make sure their blood is safe. This is blood bank blood. Transfusion therapy is the safest it has ever been in the practice of medicine. Before blood is taken from a donor, a complete history is taken to make sure that person has no history of diseases nor engages in risky social behavior (examples are intravenous drug use or sexual activity with multiple partners). The donor's travel history is screened to minimize risk of transmitting infections, such as malaria. The donated blood is tested for signs of infectious diseases, such as HIV and hepatitis. The blood is then tested to be sure it is compatible with you in order to minimize the chance of a transfusion reaction. If you or a relative donates blood, this is often done in anticipation of surgery and is not appropriate for emergency situations. It takes many days to process the donated blood. RISKS AND COMPLICATIONS Although transfusion therapy is very safe and saves many lives, the main dangers of transfusion include:  1. Getting an infectious disease. 2. Developing a transfusion reaction. This is an allergic reaction to something in the blood you were given. Every precaution is taken to prevent this. The decision to have a blood transfusion has been  considered carefully by your caregiver before blood is given. Blood is not given unless the benefits outweigh the risks. AFTER THE TRANSFUSION  Right after receiving a blood transfusion, you will usually feel much better and more energetic. This is especially true if your red blood cells have gotten low (anemic). The transfusion raises the level of the red blood cells which carry oxygen, and this usually causes an energy increase.  The nurse administering the transfusion will monitor you carefully for complications. HOME CARE INSTRUCTIONS  No special instructions are needed after a transfusion. You may find your energy is better. Speak with your caregiver about any limitations on activity for underlying diseases you may have. SEEK MEDICAL CARE IF:   Your condition is not improving after your transfusion.  You develop redness or irritation at the intravenous (IV) site. SEEK IMMEDIATE MEDICAL CARE IF:  Any of the following symptoms occur over the next 12 hours:  Shaking chills.  You have a temperature by mouth above 102 F (38.9 C), not controlled by medicine.  Chest, back, or muscle pain.  People around you feel you are not acting correctly or are confused.  Shortness of breath or difficulty breathing.  Dizziness and fainting.  You get a rash or develop hives.  You have a decrease in urine output.  Your urine turns a dark color or changes to pink, red, or brown. Any of the following symptoms occur over the next 10 days:  You have a temperature by mouth above 102 F (38.9 C), not controlled by medicine.  Shortness of breath.  Weakness after normal activity.  The white part of the eye turns yellow (jaundice).  You have a decrease in the amount of urine or are urinating less often.  Your urine turns a dark color or changes to pink, red, or brown. Document Released: 02/08/2000 Document Revised: 05/05/2011 Document Reviewed: 09/27/2007 ExitCare Patient Information 2014  [place].  _______________________________________________________________________  Incentive Spirometer  An incentive spirometer is a tool that can help keep your lungs clear and active. This tool measures how well you are filling your lungs with each breath. Taking long deep breaths may help reverse or decrease the chance of developing breathing (pulmonary) problems (especially infection) following:  A long period of time when you are unable to move or be active. BEFORE THE PROCEDURE   If the spirometer includes an indicator to show your best effort, your nurse or respiratory therapist will set it to a desired goal.  If possible, sit up straight or lean slightly forward. Try not to slouch.  Hold the incentive spirometer in an upright position. INSTRUCTIONS FOR USE  3. Sit on the edge of your bed if possible, or sit up as far as you can in bed or on a chair. 4. Hold the incentive spirometer in an upright position. 5. Breathe out normally. 6. Place the mouthpiece in your mouth and seal your lips tightly around it. 7. Breathe in slowly and as deeply as possible, raising the piston or the ball toward the top of the column. 8. Hold your breath for 3-5 seconds or for as long as possible. Allow the piston or ball to fall to the bottom of the column. 9. Remove the mouthpiece from your mouth and breathe out normally. 10. Rest for a few seconds and repeat Steps 1 through 7 at least 10 times every 1-2 hours when you are awake. Take your time and take a few normal breaths between deep breaths. 11. The spirometer may include an indicator to show your best effort. Use the indicator as a goal to work toward during each repetition. 12. After each set of 10 deep breaths, practice coughing to be sure your lungs are clear. If you have an incision (the cut made at the time of surgery), support your incision when coughing by placing a pillow or rolled up towels firmly against it. Once you are able to  get out of bed, walk around indoors and cough well. You may stop using the incentive spirometer when instructed by your caregiver.  RISKS AND COMPLICATIONS  Take your time so you do not get dizzy or light-headed.  If you are in pain, you may need to take or ask for pain medication before doing incentive spirometry. It is harder to take a deep breath if you are having pain. AFTER USE  Rest and breathe slowly and easily.  It can be helpful to keep track of a log of your progress. Your caregiver can provide you with a simple table to help with this. If you are using the spirometer at home, follow these instructions: [place] IF:   You are having difficultly using the spirometer.  You have trouble using the spirometer as often as instructed.  Your pain medication is not giving enough relief while using the spirometer.  You develop fever of 100.5 F (38.1 C) or higher. SEEK IMMEDIATE MEDICAL CARE IF:   You cough up bloody sputum that had not been present before.  You develop fever of 102 F (38.9 C) or greater.  You develop worsening pain at or near the incision site. MAKE SURE YOU:   Understand these instructions.  Will watch your condition.  Will get help right away if you are not doing well or get worse. Document Released: 06/23/2006 Document Revised: 05/05/2011 Document Reviewed: 08/24/2006 [facility] Patient Information 2014 [place], [place].   ________________________________________________________________________

## 2014-05-21 ENCOUNTER — Other Ambulatory Visit: Payer: Self-pay | Admitting: Orthopedic Surgery

## 2014-05-21 NOTE — H&P (Signed)
Kayla Stokes DOB: [birth year] Married / Language: English / Race: White Female Date of Admission:  05/22/2014 CC:  Left Knee Pain History of Present Illness  The patient is a 69 year old female who comes in for a preoperative History and Physical. The patient is scheduled for a left total knee arthroplasty to be performed by Dr. Dione Plover. Aluisio, MD at [facility] on 05-22-2014. The patient is a 69 year old female who presents for follow up of their knee. The patient is being followed for their bilateral knee pain and osteoarthritis. They are now several months out from cortisone injection in the left knee. Symptoms reported today include: pain and aching (burning). The patient feels that they are doing poorly and report their pain level to be moderate to severe. Current treatment includes: NSAIDs (Voltaren gel) and icing. The following medication has been used for pain control: Tylenol (alternating with ibuprofen for more severe pain. She does not tolerate NSAIDs well due to stomach issues). The patient has reported improvement of their symptoms with: Cortisone injections (but really only helped for a few weeks). The patient is being followed for their right hip pain and trochanteric bursitis. Symptoms reported today include: pain and aching. The patient feels that they are doing poorly. The patient has reported improvement of their symptoms with: Cortisone injections (but she has been waking up at night over the past 10 days or so due to sharp pain in her hip). Unfortunately, that left knee is getting progressively worse. It is now starting to cause pain in the right hip because of her altered gait. The knee is hurting at all times. It is limiting what she can and cannot do. She has had injections in the past without any long lasting benefit. She would liek to get something done. It is felt that she would benefit from having the left knee replaced. They have been treated conservatively in  the past for the above stated problem and despite conservative measures, they continue to have progressive pain and severe functional limitations and dysfunction. They have failed non-operative management including home exercise, medications, and injections. It is felt that they would benefit from undergoing total joint replacement. Risks and benefits of the procedure have been discussed with the patient and they elect to proceed with surgery. There are no active contraindications to surgery such as ongoing infection or rapidly progressive neurological disease.   Problem List Primary osteoarthritis of right knee (M17.11)  Allergies  No Known Drug Allergies  Intolerance Macrobid *Urinary Anti-infectives** GI Upset  Family History Cerebrovascular Accident father Cancer First Degree Relatives. mother, sister and brother Heart Disease mother and brother Diabetes Mellitus mother Hypertension mother Heart disease in female family member before age 19  Social History Illicit drug use no Pain Contract no Children 3 Current work status retired Tobacco / smoke exposure no Marital status married Number of flights of stairs before winded 2-3 Tobacco use Never smoker. never smoker Living situation live with spouse Exercise Exercises weekly; does gym / weights Alcohol use current drinker; drinks wine; only occasionally per week Drug/Alcohol Rehab (Currently) no Drug/Alcohol Rehab (Previously) no Advance Directives Living Will, Healthcare POA Post-Surgical Plans Wants to look into [place] [facility]  Medication History Omeprazole (20MG  Capsule ER, Oral) Active. (20 q am and 40 q pm) Triamterene-HCTZ (37.5-25MG  Capsule, Oral) Active. Multiple Vitamin (1 Oral) Active. Triamcinolone Acetonide (0.1% Cream, External) Active. Estradiol (0.5MG  Tablet, Oral) Active. Crestor (10MG  Tablet, Oral) Active. ALPRAZolam (0.5MG  Tablet, Oral) Active.  Past  Surgical History   Tubal Ligation Leg Circulation Surgery left Cataract Surgery bilateral Arthroscopy of Knee left Hysterectomy partial (non-cancerous) Colon Polyp Removal - Colonoscopy Gallbladder Surgery  Past Medical History Diverticulitis Of Colon Allergic Urticaria Hypercholesterolemia Anxiety Disorder Mild Varicose veins Hiatal Hernia Gastritis Past History Pancreatitis One Episode Prior to Cholecystectomy Measles Mumps Rubella Menopause Bursitis Right Hip   Review of Systems  General Not Present- Chills, Fatigue, Fever, Memory Loss, Night Sweats, Weight Gain and Weight Loss. Skin Not Present- Eczema, Hives, Itching, Lesions and Rash. HEENT Present- Tinnitus. Not Present- Dentures, Double Vision, Headache, Hearing Loss and Visual Loss. Respiratory Not Present- Allergies, Chronic Cough, Coughing up blood, Shortness of breath at rest and Shortness of breath with exertion. Cardiovascular Not Present- Chest Pain, Difficulty Breathing Lying Down, Murmur, Palpitations, Racing/skipping heartbeats and Swelling. Gastrointestinal Present- Heartburn (uses omperazole). Not Present- Abdominal Pain, Bloody Stool, Constipation, Diarrhea, Difficulty Swallowing, Jaundice, Loss of appetitie, Nausea and Vomiting. Female Genitourinary Not Present- Blood in Urine, Discharge, Flank Pain, Incontinence, Painful Urination, Urgency, Urinary frequency, Urinary Retention, Urinating at Night and Weak urinary stream. Musculoskeletal Present- Joint Pain, Joint Swelling, Morning Stiffness and Muscle Pain. Not Present- Back Pain, Muscle Weakness and Spasms. Neurological Not Present- Blackout spells, Difficulty with balance, Dizziness, Paralysis, Tremor and Weakness. Psychiatric Not Present- Insomnia.   Vitals Weight: 162 lb Height: 63in Weight was reported by patient. Height was reported by patient. Body Surface Area: 1.77 m Body Mass Index: 28.7 kg/m  BP: 114/62 (Sitting, Left Arm,  Standard)   Physical Exam General Mental Status -Alert, cooperative and good historian. General Appearance-pleasant, Not in acute distress. Orientation-Oriented X3. Build & Nutrition-Well nourished and Well developed.  Head and Neck Head-normocephalic, atraumatic . Neck Global Assessment - supple, no bruit auscultated on the right, no bruit auscultated on the left.  Eye Pupil - Bilateral-Regular and Round. Motion - Bilateral-EOMI.  Chest and Lung Exam Auscultation Breath sounds - clear at anterior chest wall and clear at posterior chest wall. Adventitious sounds - No Adventitious sounds.  Cardiovascular Auscultation Rhythm - Regular rate and rhythm. Heart Sounds - S1 WNL and S2 WNL. Murmurs & Other Heart Sounds - Auscultation of the heart reveals - No Murmurs.  Abdomen Palpation/Percussion Tenderness - Abdomen is non-tender to palpation. Rigidity (guarding) - Abdomen is soft. Auscultation Auscultation of the abdomen reveals - Bowel sounds normal.  Female Genitourinary Note: Not done, not pertinent to present illness   Musculoskeletal Note: On examination, she is alert and oriented in no apparent distress. Her hips show normal range of motion with no discomfort. The right knee has no effusion. Range is zero to 135 with no tenderness or instability. The left knee has varus deformity with no effusion. Range is 10 to 125 with marked crepitus on range of motion with tenderness medial greater than lateral with no instability noted. Pulse, sensation and motor are intact.  RADIOGRAPHS: She has bone-on-bone arthritis of the medial and patellofemoral compartments of the left knee with slight varus. The right knee has minimal change.   Assessment & Plan  Primary osteoarthritis of left knee (M17.12) Note:Surgical Plans: Left Total Knee Repalcement  Disposition: [place]  PCP: Dr. Jerilynn Birkenhead - Patient has been seen preoperatively and felt to  be stable for surgery pending normal ancillary tests.  IV TXA  Anesthesia Issues: None except "light weight" and slow to wake up following gallbladder procedure  Signed electronically by Joelene Millin, III PA-C

## 2014-05-22 ENCOUNTER — Inpatient Hospital Stay (HOSPITAL_COMMUNITY)
Admission: RE | Admit: 2014-05-22 | Discharge: 2014-05-24 | DRG: 470 | Disposition: A | Discharge status: Skilled Nursing Facility | Payer: Medicare Other | Source: Ambulatory Visit | Attending: Orthopedic Surgery | Admitting: Orthopedic Surgery

## 2014-05-22 ENCOUNTER — Encounter (HOSPITAL_COMMUNITY)
Admission: RE | Disposition: A | Discharge status: Skilled Nursing Facility | Payer: Self-pay | Source: Ambulatory Visit | Attending: Orthopedic Surgery

## 2014-05-22 ENCOUNTER — Encounter (HOSPITAL_COMMUNITY): Payer: Self-pay | Admitting: *Deleted

## 2014-05-22 ENCOUNTER — Inpatient Hospital Stay (HOSPITAL_COMMUNITY): Payer: Medicare Other | Admitting: Certified Registered"

## 2014-05-22 DIAGNOSIS — I10 Essential (primary) hypertension: Secondary | ICD-10-CM | POA: Diagnosis present

## 2014-05-22 DIAGNOSIS — Z79899 Other long term (current) drug therapy: Secondary | ICD-10-CM | POA: Diagnosis not present

## 2014-05-22 DIAGNOSIS — K219 Gastro-esophageal reflux disease without esophagitis: Secondary | ICD-10-CM | POA: Diagnosis present

## 2014-05-22 DIAGNOSIS — K449 Diaphragmatic hernia without obstruction or gangrene: Secondary | ICD-10-CM | POA: Diagnosis present

## 2014-05-22 DIAGNOSIS — M7061 Trochanteric bursitis, right hip: Secondary | ICD-10-CM | POA: Diagnosis present

## 2014-05-22 DIAGNOSIS — Z01812 Encounter for preprocedural laboratory examination: Secondary | ICD-10-CM | POA: Diagnosis not present

## 2014-05-22 DIAGNOSIS — M8580 Other specified disorders of bone density and structure, unspecified site: Secondary | ICD-10-CM | POA: Diagnosis present

## 2014-05-22 DIAGNOSIS — M25562 Pain in left knee: Secondary | ICD-10-CM | POA: Diagnosis present

## 2014-05-22 DIAGNOSIS — M1712 Unilateral primary osteoarthritis, left knee: Secondary | ICD-10-CM | POA: Diagnosis present

## 2014-05-22 DIAGNOSIS — M1710 Unilateral primary osteoarthritis, unspecified knee: Secondary | ICD-10-CM | POA: Diagnosis present

## 2014-05-22 DIAGNOSIS — I959 Hypotension, unspecified: Secondary | ICD-10-CM | POA: Diagnosis not present

## 2014-05-22 DIAGNOSIS — M179 Osteoarthritis of knee, unspecified: Secondary | ICD-10-CM | POA: Diagnosis present

## 2014-05-22 HISTORY — PX: TOTAL KNEE ARTHROPLASTY: SHX125

## 2014-05-22 LAB — TYPE AND SCREEN
ABO/RH(D): A POS
Antibody Screen: NEGATIVE

## 2014-05-22 LAB — ABO/RH: ABO/RH(D): A POS

## 2014-05-22 SURGERY — ARTHROPLASTY, KNEE, TOTAL
Anesthesia: Spinal | Site: Knee | Laterality: Left

## 2014-05-22 MED ORDER — ONDANSETRON HCL 4 MG/2ML IJ SOLN
INTRAMUSCULAR | Status: AC
  Filled 2014-05-22: qty 4

## 2014-05-22 MED ORDER — SODIUM CHLORIDE 0.9 % IV SOLN: INTRAVENOUS | Status: DC

## 2014-05-22 MED ORDER — PROPOFOL 10 MG/ML IV BOLUS
INTRAVENOUS | Status: DC | PRN
  Administered 2014-05-22: 40 mg via INTRAVENOUS
  Administered 2014-05-22 (×2): 20 mg via INTRAVENOUS

## 2014-05-22 MED ORDER — PROPOFOL 10 MG/ML IV BOLUS
INTRAVENOUS | Status: AC
  Filled 2014-05-22: qty 20

## 2014-05-22 MED ORDER — FENTANYL CITRATE 0.05 MG/ML IJ SOLN
INTRAMUSCULAR | Status: AC
  Filled 2014-05-22: qty 2

## 2014-05-22 MED ORDER — ACETAMINOPHEN 650 MG RE SUPP: 650.0000 mg | Freq: Four times a day (QID) | RECTAL | Status: DC | PRN

## 2014-05-22 MED ORDER — ACETAMINOPHEN 325 MG PO TABS: 650.0000 mg | ORAL_TABLET | Freq: Four times a day (QID) | ORAL | Status: DC | PRN

## 2014-05-22 MED ORDER — ACETAMINOPHEN 10 MG/ML IV SOLN
1000.0000 mg | Freq: Once | INTRAVENOUS | Status: AC
  Administered 2014-05-22: 1000 mg via INTRAVENOUS
  Filled 2014-05-22: qty 100

## 2014-05-22 MED ORDER — KETOROLAC TROMETHAMINE 15 MG/ML IJ SOLN
7.5000 mg | Freq: Four times a day (QID) | INTRAMUSCULAR | Status: AC | PRN
  Administered 2014-05-22: 7.5 mg via INTRAVENOUS
  Filled 2014-05-22: qty 1

## 2014-05-22 MED ORDER — HYDROMORPHONE HCL 1 MG/ML IJ SOLN
INTRAMUSCULAR | Status: DC | PRN
  Administered 2014-05-22: 1 mg via INTRAVENOUS

## 2014-05-22 MED ORDER — ONDANSETRON HCL 4 MG PO TABS: 4.0000 mg | ORAL_TABLET | Freq: Four times a day (QID) | ORAL | Status: DC | PRN

## 2014-05-22 MED ORDER — DEXAMETHASONE SODIUM PHOSPHATE 10 MG/ML IJ SOLN
INTRAMUSCULAR | Status: AC
  Filled 2014-05-22: qty 1

## 2014-05-22 MED ORDER — LORATADINE 10 MG PO TABS
10.0000 mg | ORAL_TABLET | Freq: Every morning | ORAL | Status: DC
  Administered 2014-05-22 – 2014-05-24 (×3): 10 mg via ORAL
  Filled 2014-05-22 (×3): qty 1

## 2014-05-22 MED ORDER — PANTOPRAZOLE SODIUM 40 MG PO TBEC
80.0000 mg | DELAYED_RELEASE_TABLET | Freq: Every day | ORAL | Status: DC
  Administered 2014-05-22 – 2014-05-24 (×3): 80 mg via ORAL
  Filled 2014-05-22 (×4): qty 2

## 2014-05-22 MED ORDER — MIDAZOLAM HCL 5 MG/5ML IJ SOLN
INTRAMUSCULAR | Status: DC | PRN
  Administered 2014-05-22: 2 mg via INTRAVENOUS

## 2014-05-22 MED ORDER — HYDROMORPHONE HCL 2 MG/ML IJ SOLN
INTRAMUSCULAR | Status: AC
  Filled 2014-05-22: qty 1

## 2014-05-22 MED ORDER — ALPRAZOLAM 0.25 MG PO TABS
0.2500 mg | ORAL_TABLET | Freq: Every evening | ORAL | Status: DC | PRN
Start: 2014-05-22 — End: 2014-05-24
  Administered 2014-05-23: 0.25 mg via ORAL
  Filled 2014-05-22: qty 1

## 2014-05-22 MED ORDER — BUPIVACAINE HCL 0.25 % IJ SOLN
INTRAMUSCULAR | Status: DC | PRN
  Administered 2014-05-22: 30 mL

## 2014-05-22 MED ORDER — METOCLOPRAMIDE HCL 5 MG/ML IJ SOLN: 5.0000 mg | Freq: Three times a day (TID) | INTRAMUSCULAR | Status: DC | PRN

## 2014-05-22 MED ORDER — ONDANSETRON HCL 4 MG/2ML IJ SOLN
INTRAMUSCULAR | Status: AC
  Filled 2014-05-22: qty 2

## 2014-05-22 MED ORDER — FENTANYL CITRATE 0.05 MG/ML IJ SOLN
INTRAMUSCULAR | Status: DC | PRN
  Administered 2014-05-22: 50 ug via INTRAVENOUS

## 2014-05-22 MED ORDER — DIPHENHYDRAMINE HCL 12.5 MG/5ML PO ELIX: 12.5000 mg | ORAL_SOLUTION | ORAL | Status: DC | PRN

## 2014-05-22 MED ORDER — METHOCARBAMOL 500 MG PO TABS
500.0000 mg | ORAL_TABLET | Freq: Four times a day (QID) | ORAL | Status: DC | PRN
  Administered 2014-05-22 – 2014-05-24 (×6): 500 mg via ORAL
  Filled 2014-05-22 (×6): qty 1

## 2014-05-22 MED ORDER — LIDOCAINE HCL (CARDIAC) 20 MG/ML IV SOLN
INTRAVENOUS | Status: DC | PRN
  Administered 2014-05-22: 20 mg via INTRAVENOUS

## 2014-05-22 MED ORDER — CEFAZOLIN SODIUM-DEXTROSE 2-3 GM-% IV SOLR
2.0000 g | INTRAVENOUS | Status: AC
  Administered 2014-05-22: 2 g via INTRAVENOUS

## 2014-05-22 MED ORDER — METHOCARBAMOL 1000 MG/10ML IJ SOLN
500.0000 mg | Freq: Four times a day (QID) | INTRAVENOUS | Status: DC | PRN
  Administered 2014-05-22: 500 mg via INTRAVENOUS
  Filled 2014-05-22 (×2): qty 5

## 2014-05-22 MED ORDER — CEFAZOLIN SODIUM-DEXTROSE 2-3 GM-% IV SOLR
INTRAVENOUS | Status: AC
  Filled 2014-05-22: qty 50

## 2014-05-22 MED ORDER — OXYCODONE HCL 5 MG PO TABS
5.0000 mg | ORAL_TABLET | ORAL | Status: DC | PRN
  Administered 2014-05-22 – 2014-05-23 (×3): 10 mg via ORAL
  Administered 2014-05-23: 5 mg via ORAL
  Administered 2014-05-23 – 2014-05-24 (×7): 10 mg via ORAL
  Filled 2014-05-22 (×8): qty 2
  Filled 2014-05-22: qty 1
  Filled 2014-05-22 (×3): qty 2

## 2014-05-22 MED ORDER — TRIAMTERENE-HCTZ 37.5-25 MG PO TABS
1.0000 | ORAL_TABLET | Freq: Every day | ORAL | Status: DC
  Filled 2014-05-22: qty 1

## 2014-05-22 MED ORDER — MENTHOL 3 MG MT LOZG: 1.0000 | LOZENGE | OROMUCOSAL | Status: DC | PRN

## 2014-05-22 MED ORDER — ACETAMINOPHEN 500 MG PO TABS
1000.0000 mg | ORAL_TABLET | Freq: Four times a day (QID) | ORAL | Status: AC
  Administered 2014-05-22 – 2014-05-23 (×4): 1000 mg via ORAL
  Filled 2014-05-22 (×4): qty 2

## 2014-05-22 MED ORDER — TRAMADOL HCL 50 MG PO TABS
50.0000 mg | ORAL_TABLET | Freq: Four times a day (QID) | ORAL | Status: DC | PRN
  Administered 2014-05-23 – 2014-05-24 (×4): 100 mg via ORAL
  Filled 2014-05-22 (×4): qty 2

## 2014-05-22 MED ORDER — DEXAMETHASONE SODIUM PHOSPHATE 10 MG/ML IJ SOLN
10.0000 mg | Freq: Once | INTRAMUSCULAR | Status: AC
  Administered 2014-05-22: 10 mg via INTRAVENOUS

## 2014-05-22 MED ORDER — CHLORHEXIDINE GLUCONATE 4 % EX LIQD: 60.0000 mL | Freq: Once | CUTANEOUS | Status: DC

## 2014-05-22 MED ORDER — SODIUM CHLORIDE 0.9 % IJ SOLN
INTRAMUSCULAR | Status: AC
  Filled 2014-05-22: qty 50

## 2014-05-22 MED ORDER — POLYETHYLENE GLYCOL 3350 17 G PO PACK
17.0000 g | PACK | Freq: Every day | ORAL | Status: DC | PRN
  Administered 2014-05-24: 17 g via ORAL
  Filled 2014-05-22: qty 1

## 2014-05-22 MED ORDER — BUPIVACAINE LIPOSOME 1.3 % IJ SUSP
20.0000 mL | Freq: Once | INTRAMUSCULAR | Status: DC
Start: 2014-05-22 — End: 2014-05-22
  Filled 2014-05-22: qty 20

## 2014-05-22 MED ORDER — ONDANSETRON HCL 4 MG/2ML IJ SOLN
INTRAMUSCULAR | Status: DC | PRN
  Administered 2014-05-22: 4 mg via INTRAVENOUS

## 2014-05-22 MED ORDER — BUPIVACAINE HCL (PF) 0.25 % IJ SOLN
INTRAMUSCULAR | Status: AC
  Filled 2014-05-22: qty 30

## 2014-05-22 MED ORDER — LACTATED RINGERS IV SOLN
INTRAVENOUS | Status: DC
  Administered 2014-05-22: 1000 mL via INTRAVENOUS
  Administered 2014-05-22: 11:00:00 via INTRAVENOUS

## 2014-05-22 MED ORDER — DOCUSATE SODIUM 100 MG PO CAPS
100.0000 mg | ORAL_CAPSULE | Freq: Two times a day (BID) | ORAL | Status: DC
  Administered 2014-05-22 – 2014-05-24 (×4): 100 mg via ORAL

## 2014-05-22 MED ORDER — SODIUM CHLORIDE 0.9 % IV SOLN
INTRAVENOUS | Status: DC
  Administered 2014-05-22 (×2): via INTRAVENOUS

## 2014-05-22 MED ORDER — PHENOL 1.4 % MT LIQD: 1.0000 | OROMUCOSAL | Status: DC | PRN

## 2014-05-22 MED ORDER — BUPIVACAINE IN DEXTROSE 0.75-8.25 % IT SOLN
INTRATHECAL | Status: DC | PRN
  Administered 2014-05-22: 1.8 mL via INTRATHECAL

## 2014-05-22 MED ORDER — MORPHINE SULFATE 2 MG/ML IJ SOLN
1.0000 mg | INTRAMUSCULAR | Status: DC | PRN
  Administered 2014-05-22 – 2014-05-24 (×4): 2 mg via INTRAVENOUS
  Filled 2014-05-22 (×5): qty 1

## 2014-05-22 MED ORDER — CEFAZOLIN SODIUM-DEXTROSE 2-3 GM-% IV SOLR
2.0000 g | Freq: Four times a day (QID) | INTRAVENOUS | Status: AC
  Administered 2014-05-22 (×2): 2 g via INTRAVENOUS
  Filled 2014-05-22 (×2): qty 50

## 2014-05-22 MED ORDER — DEXAMETHASONE SODIUM PHOSPHATE 10 MG/ML IJ SOLN
10.0000 mg | Freq: Once | INTRAMUSCULAR | Status: AC
  Administered 2014-05-23: 10 mg via INTRAVENOUS
  Filled 2014-05-22: qty 1

## 2014-05-22 MED ORDER — FLEET ENEMA 7-19 GM/118ML RE ENEM: 1.0000 | ENEMA | Freq: Once | RECTAL | Status: AC | PRN

## 2014-05-22 MED ORDER — BUPIVACAINE LIPOSOME 1.3 % IJ SUSP
INTRAMUSCULAR | Status: DC | PRN
  Administered 2014-05-22: 20 mL

## 2014-05-22 MED ORDER — HYDROMORPHONE HCL 1 MG/ML IJ SOLN: 0.2500 mg | INTRAMUSCULAR | Status: DC | PRN

## 2014-05-22 MED ORDER — SODIUM CHLORIDE 0.9 % IV SOLN
1000.0000 mg | INTRAVENOUS | Status: AC
  Administered 2014-05-22: 1000 mg via INTRAVENOUS
  Filled 2014-05-22: qty 10

## 2014-05-22 MED ORDER — PROPOFOL INFUSION 10 MG/ML OPTIME
INTRAVENOUS | Status: DC | PRN
  Administered 2014-05-22: 100 ug/kg/min via INTRAVENOUS

## 2014-05-22 MED ORDER — BISACODYL 10 MG RE SUPP: 10.0000 mg | Freq: Every day | RECTAL | Status: DC | PRN

## 2014-05-22 MED ORDER — SODIUM CHLORIDE 0.9 % IJ SOLN
INTRAMUSCULAR | Status: DC | PRN
  Administered 2014-05-22: 30 mL via INTRAVENOUS

## 2014-05-22 MED ORDER — ROSUVASTATIN CALCIUM 5 MG PO TABS
5.0000 mg | ORAL_TABLET | Freq: Every day | ORAL | Status: DC
  Administered 2014-05-22 – 2014-05-23 (×2): 5 mg via ORAL
  Filled 2014-05-22 (×3): qty 1

## 2014-05-22 MED ORDER — MIDAZOLAM HCL 2 MG/2ML IJ SOLN
INTRAMUSCULAR | Status: AC
  Filled 2014-05-22: qty 2

## 2014-05-22 MED ORDER — ONDANSETRON HCL 4 MG/2ML IJ SOLN: 4.0000 mg | Freq: Four times a day (QID) | INTRAMUSCULAR | Status: DC | PRN

## 2014-05-22 MED ORDER — METOCLOPRAMIDE HCL 10 MG PO TABS: 5.0000 mg | ORAL_TABLET | Freq: Three times a day (TID) | ORAL | Status: DC | PRN

## 2014-05-22 MED ORDER — RIVAROXABAN 10 MG PO TABS
10.0000 mg | ORAL_TABLET | Freq: Every day | ORAL | Status: DC
  Administered 2014-05-23 – 2014-05-24 (×2): 10 mg via ORAL
  Filled 2014-05-22 (×3): qty 1

## 2014-05-22 SURGICAL SUPPLY — 62 items
BAG DECANTER FOR FLEXI CONT (MISCELLANEOUS) ×2 IMPLANT
BAG SPEC THK2 15X12 ZIP CLS (MISCELLANEOUS) ×1
BAG ZIPLOCK 12X15 (MISCELLANEOUS) ×2 IMPLANT
BANDAGE ELASTIC 6 VELCRO ST LF (GAUZE/BANDAGES/DRESSINGS) ×2 IMPLANT
BANDAGE ESMARK 6X9 LF (GAUZE/BANDAGES/DRESSINGS) ×1 IMPLANT
BLADE SAG 18X100X1.27 (BLADE) ×2 IMPLANT
BLADE SAW SGTL 11.0X1.19X90.0M (BLADE) ×2 IMPLANT
BNDG CMPR 9X6 STRL LF SNTH (GAUZE/BANDAGES/DRESSINGS) ×1
BNDG ESMARK 6X9 LF (GAUZE/BANDAGES/DRESSINGS) ×2
BOWL SMART MIX CTS (DISPOSABLE) ×2 IMPLANT
CAPT KNEE TOTAL 3 ATTUNE ×1 IMPLANT
CEMENT HV SMART SET (Cement) ×4 IMPLANT
CUFF TOURN SGL QUICK 34 (TOURNIQUET CUFF) ×2
CUFF TRNQT CYL 34X4X40X1 (TOURNIQUET CUFF) ×1 IMPLANT
DECANTER SPIKE VIAL GLASS SM (MISCELLANEOUS) ×2 IMPLANT
DRAPE EXTREMITY T 121X128X90 (DRAPE) ×2 IMPLANT
DRAPE POUCH INSTRU U-SHP 10X18 (DRAPES) ×2 IMPLANT
DRAPE U-SHAPE 47X51 STRL (DRAPES) ×2 IMPLANT
DRSG ADAPTIC 3X8 NADH LF (GAUZE/BANDAGES/DRESSINGS) ×2 IMPLANT
DRSG PAD ABDOMINAL 8X10 ST (GAUZE/BANDAGES/DRESSINGS) ×2 IMPLANT
DURAPREP 26ML APPLICATOR (WOUND CARE) ×2 IMPLANT
ELECT REM PT RETURN 9FT ADLT (ELECTROSURGICAL) ×2
ELECTRODE REM PT RTRN 9FT ADLT (ELECTROSURGICAL) ×1 IMPLANT
EVACUATOR 1/8 PVC DRAIN (DRAIN) ×2 IMPLANT
FACESHIELD WRAPAROUND (MASK) ×10 IMPLANT
GAUZE SPONGE 4X4 12PLY STRL (GAUZE/BANDAGES/DRESSINGS) ×2 IMPLANT
GLOVE BIO SURGEON STRL SZ7.5 (GLOVE) IMPLANT
GLOVE BIO SURGEON STRL SZ8 (GLOVE) ×2 IMPLANT
GLOVE BIOGEL PI IND STRL 6.5 (GLOVE) IMPLANT
GLOVE BIOGEL PI IND STRL 8 (GLOVE) ×1 IMPLANT
GLOVE BIOGEL PI INDICATOR 6.5 (GLOVE)
GLOVE BIOGEL PI INDICATOR 8 (GLOVE) ×2
GLOVE SURG SS PI 6.5 STRL IVOR (GLOVE) IMPLANT
GOWN STRL REUS W/TWL LRG LVL3 (GOWN DISPOSABLE) ×2 IMPLANT
GOWN STRL REUS W/TWL XL LVL3 (GOWN DISPOSABLE) IMPLANT
HANDPIECE INTERPULSE COAX TIP (DISPOSABLE) ×2
IMMOBILIZER KNEE 20 (SOFTGOODS) ×2
IMMOBILIZER KNEE 20 THIGH 36 (SOFTGOODS) ×1 IMPLANT
KIT BASIN OR (CUSTOM PROCEDURE TRAY) ×2 IMPLANT
MANIFOLD NEPTUNE II (INSTRUMENTS) ×2 IMPLANT
NDL SAFETY ECLIPSE 18X1.5 (NEEDLE) ×2 IMPLANT
NEEDLE HYPO 18GX1.5 SHARP (NEEDLE) ×4
NS IRRIG 1000ML POUR BTL (IV SOLUTION) ×2 IMPLANT
PACK TOTAL JOINT (CUSTOM PROCEDURE TRAY) ×2 IMPLANT
PADDING CAST COTTON 6X4 STRL (CAST SUPPLIES) ×4 IMPLANT
PEN SKIN MARKING BROAD (MISCELLANEOUS) ×2 IMPLANT
POSITIONER SURGICAL ARM (MISCELLANEOUS) ×2 IMPLANT
SET HNDPC FAN SPRY TIP SCT (DISPOSABLE) ×1 IMPLANT
STRIP CLOSURE SKIN 1/2X4 (GAUZE/BANDAGES/DRESSINGS) ×2 IMPLANT
SUCTION FRAZIER 12FR DISP (SUCTIONS) ×2 IMPLANT
SUT MNCRL AB 4-0 PS2 18 (SUTURE) ×2 IMPLANT
SUT VIC AB 2-0 CT1 27 (SUTURE) ×6
SUT VIC AB 2-0 CT1 TAPERPNT 27 (SUTURE) ×3 IMPLANT
SUT VLOC 180 0 24IN GS25 (SUTURE) ×2 IMPLANT
SYR 20CC LL (SYRINGE) ×2 IMPLANT
SYR 50ML LL SCALE MARK (SYRINGE) ×2 IMPLANT
TOWEL OR 17X26 10 PK STRL BLUE (TOWEL DISPOSABLE) ×2 IMPLANT
TOWEL OR NON WOVEN STRL DISP B (DISPOSABLE) ×1 IMPLANT
TRAY FOLEY CATH 14FRSI W/METER (CATHETERS) ×2 IMPLANT
WATER STERILE IRR 1500ML POUR (IV SOLUTION) ×2 IMPLANT
WRAP KNEE MAXI GEL POST OP (GAUZE/BANDAGES/DRESSINGS) ×2 IMPLANT
YANKAUER SUCT BULB TIP 10FT TU (MISCELLANEOUS) ×2 IMPLANT

## 2014-05-22 NOTE — Progress Notes (Signed)
Utilization review completed.  

## 2014-05-22 NOTE — Anesthesia Preprocedure Evaluation (Signed)
Anesthesia Evaluation  Patient identified by MRN, date of birth, ID band Patient awake    Reviewed: Allergy & Precautions, NPO status , Patient's Chart, lab work & pertinent test results  History of Anesthesia Complications (+) history of anesthetic complications  Airway Mallampati: II  TM Distance: >3 FB Neck ROM: Full    Dental no notable dental hx.    Pulmonary neg pulmonary ROS,  breath sounds clear to auscultation  Pulmonary exam normal       Cardiovascular hypertension, Pt. on medications + Peripheral Vascular Disease Rhythm:Regular Rate:Normal     Neuro/Psych Anxiety negative neurological ROS     GI/Hepatic Neg liver ROS, hiatal hernia, GERD-  Medicated,  Endo/Other  negative endocrine ROS  Renal/GU negative Renal ROS  negative genitourinary   Musculoskeletal  (+) Arthritis -,   Abdominal   Peds negative pediatric ROS (+)  Hematology negative hematology ROS (+)   Anesthesia Other Findings   Reproductive/Obstetrics negative OB ROS                             Anesthesia Physical Anesthesia Plan  ASA: II  Anesthesia Plan: Spinal   Post-op Pain Management:    Induction: Intravenous  Airway Management Planned:   Additional Equipment:   Intra-op Plan:   Post-operative Plan: Extubation in OR  Informed Consent: I have reviewed the patients History and Physical, chart, labs and discussed the procedure including the risks, benefits and alternatives for the proposed anesthesia with the patient or authorized representative who has indicated his/her understanding and acceptance.   Dental advisory given  Plan Discussed with: CRNA  Anesthesia Plan Comments: (Discussed spinal and general. Discussed risks and benefits of and differences between spinal and general. Discussed risks of spinal including headache, backache, failure, bleeding, infection, and nerve damage. Patient  consents to spinal. Questions answered. Coagulation studies and platelet count acceptable.)        Anesthesia Quick Evaluation

## 2014-05-22 NOTE — Op Note (Signed)
Pre-operative diagnosis- Osteoarthritis  Left knee(s)  Post-operative diagnosis- Osteoarthritis Left knee(s)  Procedure-  Left  Total Knee Arthroplasty (Attune system)  Surgeon- Dione Plover. Yancarlos Berthold, MD  Assistant- Molli Barrows, PA-C   Anesthesia-  Spinal  EBL-* No blood loss amount entered *   Drains Hemovac  Tourniquet time-  Total Tourniquet Time Documented: Thigh (Left) - 35 minutes Total: Thigh (Left) - 35 minutes     Complications- None  Condition-PACU - hemodynamically stable.   Brief Clinical Note  Kayla Stokes is a 69 y.o. year old female with end stage OA of her left knee with progressively worsening pain and dysfunction. She has constant pain, with activity and at rest and significant functional deficits with difficulties even with ADLs. She has had extensive non-op management including analgesics, injections of cortisone and viscosupplements, and home exercise program, but remains in significant pain with significant dysfunction. Radiographs show bone on bone arthritis medial and patellofemoral. She presents now for left Total Knee Arthroplasty.    Procedure in detail---   The patient is brought into the operating room and positioned supine on the operating table. After successful administration of  Spinal,   a tourniquet is placed high on the  Left thigh(s) and the lower extremity is prepped and draped in the usual sterile fashion. Time out is performed by the operating team and then the  Left lower extremity is wrapped in Esmarch, knee flexed and the tourniquet inflated to 300 mmHg.       A midline incision is made with a ten blade through the subcutaneous tissue to the level of the extensor mechanism. A fresh blade is used to make a medial parapatellar arthrotomy. Soft tissue over the proximal medial tibia is subperiosteally elevated to the joint line with a knife and into the semimembranosus bursa with a Cobb elevator. Soft tissue over the proximal lateral tibia is  elevated with attention being paid to avoiding the patellar tendon on the tibial tubercle. The patella is everted, knee flexed 90 degrees and the ACL and PCL are removed. Findings are bone on bone arthritis medial and patellofemoral with large global osteophytes.        The drill is used to create a starting hole in the distal femur and the canal is thoroughly irrigated with sterile saline to remove the fatty contents. The 5 degree Left  valgus alignment guide is placed into the femoral canal and the distal femoral cutting block is pinned to remove 9 mm off the distal femur. Resection is made with an oscillating saw.      The tibia is subluxed forward and the menisci are removed. The extramedullary alignment guide is placed referencing proximally at the medial aspect of the tibial tubercle and distally along the second metatarsal axis and tibial crest. The block is pinned to remove 9mm off the more deficient medial  side. Resection is made with an oscillating saw. Size 3is the most appropriate size for the tibia and the proximal tibia is prepared with the modular drill and keel punch for that size.      The femoral sizing guide is placed and size 4 is most appropriate. Rotation is marked off the epicondylar axis and confirmed by creating a rectangular flexion gap at 90 degrees. The size 4 cutting block is pinned in this rotation and the anterior, posterior and chamfer cuts are made with the oscillating saw. The intercondylar block is then placed and that cut is made.      Trial size  3 tibial component, trial size 4 posterior stabilized femur and a 8  mm posterior stabilized rotating platform insert trial is placed. Full extension is achieved with excellent varus/valgus and anterior/posterior balance throughout full range of motion. The patella is everted and thickness measured to be 22  mm. Free hand resection is taken to 12 mm, a 38 template is placed, lug holes are drilled, trial patella is placed, and it  tracks normally. Osteophytes are removed off the posterior femur with the trial in place. All trials are removed and the cut bone surfaces prepared with pulsatile lavage. Cement is mixed and once ready for implantation, the size 3 tibial implant, size  4 narrow posterior stabilized femoral component, and the size 38 patella are cemented in place and the patella is held with the clamp. The trial insert is placed and the knee held in full extension. The Exparel (20 ml mixed with 30 ml saline) and .25% Bupivicaine, are injected into the extensor mechanism, posterior capsule, medial and lateral gutters and subcutaneous tissues.  All extruded cement is removed and once the cement is hard the permanent 8 mm posterior stabilized rotating platform insert is placed into the tibial tray.      The wound is copiously irrigated with saline solution and the extensor mechanism closed over a hemovac drain with #1 V-loc suture. The tourniquet is released for a total tourniquet time of 35  minutes. Flexion against gravity is 140 degrees and the patella tracks normally. Subcutaneous tissue is closed with 2.0 vicryl and subcuticular with running 4.0 Monocryl. The incision is cleaned and dried and steri-strips and a bulky sterile dressing are applied. The limb is placed into a knee immobilizer and the patient is awakened and transported to recovery in stable condition.      Please note that a surgical assistant was a medical necessity for this procedure in order to perform it in a safe and expeditious manner. Surgical assistant was necessary to retract the ligaments and vital neurovascular structures to prevent injury to them and also necessary for proper positioning of the limb to allow for anatomic placement of the prosthesis.   Dione Plover Khoi Hamberger, MD    05/22/2014, 11:13 AM

## 2014-05-22 NOTE — Transfer of Care (Signed)
Immediate Anesthesia Transfer of Care Note  Patient: Kayla Stokes  Procedure(s) Performed: Procedure(s) (LRB): LEFT TOTAL KNEE ARTHROPLASTY (Left)  Patient Location: PACU  Anesthesia Type: General and Spinal  Level of Consciousness: sedated, patient cooperative and responds to stimulation  Airway & Oxygen Therapy: Patient Spontanous Breathing and Patient connected to face mask oxgen  Post-op Assessment: Report given to PACU RN and Post -op Vital signs reviewed and stable  Post vital signs: Reviewed and stable  Complications: No apparent anesthesia complications

## 2014-05-22 NOTE — Anesthesia Postprocedure Evaluation (Signed)
  Anesthesia Post-op Note  Patient: Kayla Stokes  Procedure(s) Performed: Procedure(s) (LRB): LEFT TOTAL KNEE ARTHROPLASTY (Left)  Patient Location: PACU  Anesthesia Type: General and spinal  Level of Consciousness: awake and alert   Airway and Oxygen Therapy: Patient Spontanous Breathing  Post-op Pain: mild  Post-op Assessment: Post-op Vital signs reviewed, Patient's Cardiovascular Status Stable, Respiratory Function Stable, Patent Airway and No signs of Nausea or vomiting  Last Vitals:  Filed Vitals:   05/22/14 1437  BP: 116/64  Pulse: 60  Temp: 36.4 C  Resp: 12    Post-op Vital Signs: stable   Complications: No apparent anesthesia complications

## 2014-05-22 NOTE — Plan of Care (Signed)
Problem: Phase I Progression Outcomes Goal: Initial discharge plan identified Outcome: Completed/Met Date Met:  05/22/14 Pt states that she plans to go to [facility] at discharge.

## 2014-05-22 NOTE — Interval H&P Note (Signed)
History and Physical Interval Note:  05/22/2014 9:31 AM  Kayla Stokes  has presented today for surgery, with the diagnosis of LEFT KNEE OA  The various methods of treatment have been discussed with the patient and family. After consideration of risks, benefits and other options for treatment, the patient has consented to  Procedure(s): LEFT TOTAL KNEE ARTHROPLASTY (Left) as a surgical intervention .  The patient's history has been reviewed, patient examined, no change in status, stable for surgery.  I have reviewed the patient's chart and labs.  Questions were answered to the patient's satisfaction.     Gearlean Alf

## 2014-05-22 NOTE — Anesthesia Procedure Notes (Addendum)
Spinal Patient location during procedure: OR End time: 05/22/2014 10:14 AM Staffing Anesthesiologist: Franne Grip Resident/CRNA: Lajuana Carry E Performed by: resident/CRNA  Preanesthetic Checklist Completed: patient identified, site marked, surgical consent, pre-op evaluation, timeout performed, IV checked, risks and benefits discussed and monitors and equipment checked Spinal Block Patient position: sitting Prep: Betadine Patient monitoring: heart rate, continuous pulse ox and blood pressure Approach: midline Location: L3-4 Injection technique: single-shot Needle Needle type: Spinocan  Needle gauge: 22 G Needle length: 9 cm Assessment Sensory level: T6 Additional Notes Expiration date of kit checked and confirmed.  Patient tolerated procedure well, without complications.    Procedure Name: LMA Insertion Date/Time: 05/22/2014 10:51 AM Performed by: Johnathan Hausen A Pre-anesthesia Checklist: Patient identified, Timeout performed, Emergency Drugs available, Suction available and Patient being monitored Patient Re-evaluated:Patient Re-evaluated prior to inductionOxygen Delivery Method: Circle system utilized Preoxygenation: Pre-oxygenation with 100% oxygen Intubation Type: Inhalational induction LMA: LMA with gastric port inserted LMA Size: 4.0 Tube type: Oral Number of attempts: 1 Placement Confirmation: positive ETCO2 and breath sounds checked- equal and bilateral Tube secured with: Tape Dental Injury: Teeth and Oropharynx as per pre-operative assessment

## 2014-05-22 NOTE — H&P (View-Only) (Signed)
Kayla Stokes DOB: March 16, 1945 Married / Language: English / Race: White Female Date of Admission:  05/22/2014 CC:  Left Knee Pain History of Present Illness  The patient is a 69 year old female who comes in for a preoperative History and Physical. The patient is scheduled for a left total knee arthroplasty to be performed by Dr. Dione Plover. Aluisio, MD at [facility] on 05-22-2014. The patient is a 69 year old female who presents for follow up of their knee. The patient is being followed for their bilateral knee pain and osteoarthritis. They are now several months out from cortisone injection in the left knee. Symptoms reported today include: pain and aching (burning). The patient feels that they are doing poorly and report their pain level to be moderate to severe. Current treatment includes: NSAIDs (Voltaren gel) and icing. The following medication has been used for pain control: Tylenol (alternating with ibuprofen for more severe pain. She does not tolerate NSAIDs well due to stomach issues). The patient has reported improvement of their symptoms with: Cortisone injections (but really only helped for a few weeks). The patient is being followed for their right hip pain and trochanteric bursitis. Symptoms reported today include: pain and aching. The patient feels that they are doing poorly. The patient has reported improvement of their symptoms with: Cortisone injections (but she has been waking up at night over the past 10 days or so due to sharp pain in her hip). Unfortunately, that left knee is getting progressively worse. It is now starting to cause pain in the right hip because of her altered gait. The knee is hurting at all times. It is limiting what she can and cannot do. She has had injections in the past without any long lasting benefit. She would liek to get something done. It is felt that she would benefit from having the left knee replaced. They have been treated conservatively in  the past for the above stated problem and despite conservative measures, they continue to have progressive pain and severe functional limitations and dysfunction. They have failed non-operative management including home exercise, medications, and injections. It is felt that they would benefit from undergoing total joint replacement. Risks and benefits of the procedure have been discussed with the patient and they elect to proceed with surgery. There are no active contraindications to surgery such as ongoing infection or rapidly progressive neurological disease.   Problem List Primary osteoarthritis of right knee (M17.11)  Allergies  No Known Drug Allergies  Intolerance Macrobid *Urinary Anti-infectives** GI Upset  Family History Cerebrovascular Accident father Cancer First Degree Relatives. mother, sister and brother Heart Disease mother and brother Diabetes Mellitus mother Hypertension mother Heart disease in female family member before age 61  Social History Illicit drug use no Pain Contract no Children 3 Current work status retired Tobacco / smoke exposure no Marital status married Number of flights of stairs before winded 2-3 Tobacco use Never smoker. never smoker Living situation live with spouse Exercise Exercises weekly; does gym / weights Alcohol use current drinker; drinks wine; only occasionally per week Drug/Alcohol Rehab (Currently) no Drug/Alcohol Rehab (Previously) no Advance Directives Living Will, Healthcare POA Post-Surgical Plans Wants to look into [facility]  Medication History Omeprazole (20MG  Capsule ER, Oral) Active. (20 q am and 40 q pm) Triamterene-HCTZ (37.5-25MG  Capsule, Oral) Active. Multiple Vitamin (1 Oral) Active. Triamcinolone Acetonide (0.1% Cream, External) Active. Estradiol (0.5MG  Tablet, Oral) Active. Crestor (10MG  Tablet, Oral) Active. ALPRAZolam (0.5MG  Tablet, Oral) Active.  Past  Surgical History   Tubal Ligation Leg Circulation Surgery left Cataract Surgery bilateral Arthroscopy of Knee left Hysterectomy partial (non-cancerous) Colon Polyp Removal - Colonoscopy Gallbladder Surgery  Past Medical History Diverticulitis Of Colon Allergic Urticaria Hypercholesterolemia Anxiety Disorder Mild Varicose veins Hiatal Hernia Gastritis Past History Pancreatitis One Episode Prior to Cholecystectomy Measles Mumps Rubella Menopause Bursitis Right Hip   Review of Systems  General Not Present- Chills, Fatigue, Fever, Memory Loss, Night Sweats, Weight Gain and Weight Loss. Skin Not Present- Eczema, Hives, Itching, Lesions and Rash. HEENT Present- Tinnitus. Not Present- Dentures, Double Vision, Headache, Hearing Loss and Visual Loss. Respiratory Not Present- Allergies, Chronic Cough, Coughing up blood, Shortness of breath at rest and Shortness of breath with exertion. Cardiovascular Not Present- Chest Pain, Difficulty Breathing Lying Down, Murmur, Palpitations, Racing/skipping heartbeats and Swelling. Gastrointestinal Present- Heartburn (uses omperazole). Not Present- Abdominal Pain, Bloody Stool, Constipation, Diarrhea, Difficulty Swallowing, Jaundice, Loss of appetitie, Nausea and Vomiting. Female Genitourinary Not Present- Blood in Urine, Discharge, Flank Pain, Incontinence, Painful Urination, Urgency, Urinary frequency, Urinary Retention, Urinating at Night and Weak urinary stream. Musculoskeletal Present- Joint Pain, Joint Swelling, Morning Stiffness and Muscle Pain. Not Present- Back Pain, Muscle Weakness and Spasms. Neurological Not Present- Blackout spells, Difficulty with balance, Dizziness, Paralysis, Tremor and Weakness. Psychiatric Not Present- Insomnia.   Vitals Weight: 162 lb Height: 63in Weight was reported by patient. Height was reported by patient. Body Surface Area: 1.77 m Body Mass Index: 28.7 kg/m  BP: 114/62 (Sitting, Left Arm,  Standard)   Physical Exam General Mental Status -Alert, cooperative and good historian. General Appearance-pleasant, Not in acute distress. Orientation-Oriented X3. Build & Nutrition-Well nourished and Well developed.  Head and Neck Head-normocephalic, atraumatic . Neck Global Assessment - supple, no bruit auscultated on the right, no bruit auscultated on the left.  Eye Pupil - Bilateral-Regular and Round. Motion - Bilateral-EOMI.  Chest and Lung Exam Auscultation Breath sounds - clear at anterior chest wall and clear at posterior chest wall. Adventitious sounds - No Adventitious sounds.  Cardiovascular Auscultation Rhythm - Regular rate and rhythm. Heart Sounds - S1 WNL and S2 WNL. Murmurs & Other Heart Sounds - Auscultation of the heart reveals - No Murmurs.  Abdomen Palpation/Percussion Tenderness - Abdomen is non-tender to palpation. Rigidity (guarding) - Abdomen is soft. Auscultation Auscultation of the abdomen reveals - Bowel sounds normal.  Female Genitourinary Note: Not done, not pertinent to present illness   Musculoskeletal Note: On examination, she is alert and oriented in no apparent distress. Her hips show normal range of motion with no discomfort. The right knee has no effusion. Range is zero to 135 with no tenderness or instability. The left knee has varus deformity with no effusion. Range is 10 to 125 with marked crepitus on range of motion with tenderness medial greater than lateral with no instability noted. Pulse, sensation and motor are intact.  RADIOGRAPHS: She has bone-on-bone arthritis of the medial and patellofemoral compartments of the left knee with slight varus. The right knee has minimal change.   Assessment & Plan  Primary osteoarthritis of left knee (M17.12) Note:Surgical Plans: Left Total Knee Repalcement  Disposition: [facility]  PCP: Dr. Jerilynn Birkenhead - Patient has been seen preoperatively and felt to  be stable for surgery pending normal ancillary tests.  IV TXA  Anesthesia Issues: None except "light weight" and slow to wake up following gallbladder procedure  Signed electronically by Joelene Millin, III PA-C

## 2014-05-23 ENCOUNTER — Encounter (HOSPITAL_COMMUNITY): Payer: Self-pay | Admitting: Orthopedic Surgery

## 2014-05-23 LAB — CBC
HCT: 34.1 % — ABNORMAL LOW (ref 36.0–46.0)
Hemoglobin: 11.5 g/dL — ABNORMAL LOW (ref 12.0–15.0)
MCH: 30.7 pg (ref 26.0–34.0)
MCHC: 33.7 g/dL (ref 30.0–36.0)
MCV: 90.9 fL (ref 78.0–100.0)
Platelets: 269 10*3/uL (ref 150–400)
RBC: 3.75 MIL/uL — ABNORMAL LOW (ref 3.87–5.11)
RDW: 13.6 % (ref 11.5–15.5)
WBC: 14.3 10*3/uL — ABNORMAL HIGH (ref 4.0–10.5)

## 2014-05-23 LAB — BASIC METABOLIC PANEL
Anion gap: 8 (ref 5–15)
BUN: 18 mg/dL (ref 6–23)
CHLORIDE: 97 mmol/L (ref 96–112)
CO2: 28 mmol/L (ref 19–32)
Calcium: 8.2 mg/dL — ABNORMAL LOW (ref 8.4–10.5)
Creatinine, Ser: 1.02 mg/dL (ref 0.50–1.10)
GFR calc Af Amer: 65 mL/min — ABNORMAL LOW (ref >90)
GFR calc non Af Amer: 56 mL/min — ABNORMAL LOW (ref >90)
Glucose, Bld: 139 mg/dL — ABNORMAL HIGH (ref 70–99)
POTASSIUM: 3.2 mmol/L — AB (ref 3.5–5.1)
SODIUM: 133 mmol/L — AB (ref 135–145)

## 2014-05-23 MED ORDER — TRAMADOL HCL 50 MG PO TABS: 50.0000 mg | ORAL_TABLET | Freq: Four times a day (QID) | ORAL | Status: DC | PRN

## 2014-05-23 MED ORDER — POTASSIUM CHLORIDE CRYS ER 20 MEQ PO TBCR
40.0000 meq | EXTENDED_RELEASE_TABLET | Freq: Three times a day (TID) | ORAL | Status: AC
  Administered 2014-05-23 (×3): 40 meq via ORAL
  Filled 2014-05-23 (×3): qty 2

## 2014-05-23 MED ORDER — RIVAROXABAN 10 MG PO TABS: 10.0000 mg | ORAL_TABLET | Freq: Every day | ORAL | Status: DC

## 2014-05-23 MED ORDER — METHOCARBAMOL 500 MG PO TABS: 500.0000 mg | ORAL_TABLET | Freq: Four times a day (QID) | ORAL | Status: DC | PRN

## 2014-05-23 MED ORDER — OXYCODONE HCL 5 MG PO TABS
5.0000 mg | ORAL_TABLET | ORAL | Status: DC | PRN
Start: 2014-05-23 — End: 2014-12-05

## 2014-05-23 MED ORDER — DOCUSATE SODIUM 100 MG PO CAPS: 100.0000 mg | ORAL_CAPSULE | Freq: Two times a day (BID) | ORAL | Status: DC

## 2014-05-23 NOTE — Progress Notes (Signed)
   Subjective: 1 Day Post-Op Procedure(s) (LRB): LEFT TOTAL KNEE ARTHROPLASTY (Left) Patient reports pain as mild.   Patient seen in rounds with Dr. Wynelle Stokes. Patient is well, and has had no acute complaints or problems. Reports that she is feeling well. No issues overnight. No SOB or chest pain.  Plan is to go Skilled nursing facility after hospital stay.  Objective: Vital signs in last 24 hours: Temp:  [97.5 F (36.4 C)-98.4 F (36.9 C)] 98.4 F (36.9 C) (03/29 0649) Pulse Rate:  [55-79] 62 (03/29 0649) Resp:  [9-20] 16 (03/29 0739) BP: (94-127)/(39-80) 100/56 mmHg (03/29 0649) SpO2:  [95 %-100 %] 100 % (03/29 0739) Weight:  [72.576 kg (160 lb)] 72.576 kg (160 lb) (03/28 1337)  Intake/Output from previous day:  Intake/Output Summary (Last 24 hours) at 05/23/14 0814 Last data filed at 05/23/14 0736  Gross per 24 hour  Intake 3753.75 ml  Output   2870 ml  Net 883.75 ml    Intake/Output this shift: Total I/O In: 360 [P.O.:360] Out: -   Labs:  Recent Labs  05/23/14 0450  HGB 11.5*    Recent Labs  05/23/14 0450  WBC 14.3*  RBC 3.75*  HCT 34.1*  PLT 269    Recent Labs  05/23/14 0450  NA 133*  K 3.2*  CL 97  CO2 28  BUN 18  CREATININE 1.02  GLUCOSE 139*  CALCIUM 8.2*    EXAM General - Patient is Alert and Oriented Extremity - Neurologically intact Intact pulses distally Dorsiflexion/Plantar flexion intact Compartment soft Dressing - dressing C/D/I Motor Function - intact, moving foot and toes well on exam.  Hemovac pulled without difficulty.  Past Medical History  Diagnosis Date  . High cholesterol   . Arthritis     finger  . Eczema     elbow  . Hypertension     under control, has been on med. x 10 yrs.  . Dental crowns present   . Mass of hand 05/2011    right  . Pancreatitis   . H. pylori infection   . Diverticulosis   . Allergic rhinitis   . Anxiety   . Osteopenia 11/2012    T score -1.1 left forearm FRAX 6.2%/0.1%  .  Complication of anesthesia     difficult to awaken after gallbladder surgery  . GERD (gastroesophageal reflux disease)   . History of hiatal hernia   . Trochanteric bursitis of left hip   . Varicose veins   . Menopause   . Gastritis     hx of   . Measles     hx of   . Mumps     hx of   . Rubella     hx of   . Bursitis of right hip     Assessment/Plan: 1 Day Post-Op Procedure(s) (LRB): LEFT TOTAL KNEE ARTHROPLASTY (Left) Principal Problem:   OA (osteoarthritis) of knee  Estimated body mass index is 28.35 kg/(m^2) as calculated from the following:   Height as of this encounter: 5\' 3"  (1.6 m).   Weight as of this encounter: 72.576 kg (160 lb). Advance diet Up with therapy D/C IV fluids when tolerating POs well  DVT Prophylaxis - Xarelto Weight-Bearing as tolerated  D/C O2 and Pulse OX and try on Room Air DC foley  Doing well. Hypotensive. Will hold maxide. Will add k+. Plan for [facility] tomorrow. PT today.  Ardeen Jourdain, PA-C Orthopaedic Surgery 05/23/2014, 8:14 AM

## 2014-05-23 NOTE — Discharge Instructions (Addendum)
Dr. Gaynelle Arabian Total Joint Specialist [facility] [address] [place], [place] [address] [phone]  TOTAL KNEE REPLACEMENT POSTOPERATIVE DIRECTIONS    Knee Rehabilitation, Guidelines Following Surgery  Results after knee surgery are often greatly improved when you follow the exercise, range of motion and muscle strengthening exercises prescribed by your doctor. Safety measures are also important to protect the knee from further injury. Any time any of these exercises cause you to have increased pain or swelling in your knee joint, decrease the amount until you are comfortable again and slowly increase them. If you have problems or questions, call your caregiver or physical therapist for advice.   HOME CARE INSTRUCTIONS  Remove items at home which could result in a fall. This includes throw rugs or furniture in walking pathways.  Continue medications as instructed at time of discharge. You may have some home medications which will be placed on hold until you complete the course of blood thinner medication.  You may start showering once you are discharged home but do not submerge the incision under water. Just pat the incision dry and apply a dry gauze dressing on daily. Walk with walker as instructed.  You may resume a sexual relationship in one month or when given the OK by  your doctor.   Use walker as long as suggested by your caregivers.  Avoid periods of inactivity such as sitting longer than an hour when not asleep. This helps prevent blood clots.  You may put full weight on your legs and walk as much as is comfortable.  You may return to work once you are cleared by your doctor.  Do not drive a car for 6 weeks or until released by you surgeon.   Do not drive while taking narcotics.  Wear the elastic stockings for three weeks following surgery during the day but you may remove then at night. Make sure you keep all of your appointments after  your operation with all of your doctors and caregivers. You should call the office at the above phone number and make an appointment for approximately two weeks after the date of your surgery. Change the dressing daily and reapply a dry dressing each time. Please pick up a stool softener and laxative for home use as long as you are requiring pain medications.  ICE to the affected knee every three hours for 30 minutes at a time and then as needed for pain and swelling.  Continue to use ice on the knee for pain and swelling from surgery. You may notice swelling that will progress down to the foot and ankle.  This is normal after surgery.  Elevate the leg when you are not up walking on it.   It is important for you to complete the blood thinner medication as prescribed by your doctor.  Continue to use the breathing machine which will help keep your temperature down.  It is common for your temperature to cycle up and down following surgery, especially at night when you are not up moving around and exerting yourself.  The breathing machine keeps your lungs expanded and your temperature down.   RANGE OF MOTION AND STRENGTHENING EXERCISES  Rehabilitation of the knee is important following a knee injury or an operation. After just a few days of immobilization, the muscles of the thigh which control the knee become weakened and shrink (atrophy). Knee exercises are designed to build up the tone and strength of the thigh muscles and to  improve knee motion. Often times heat used for twenty to thirty minutes before working out will loosen up your tissues and help with improving the range of motion but do not use heat for the first two weeks following surgery. These exercises can be done on a training (exercise) mat, on the floor, on a table or on a bed. Use what ever works the best and is most comfortable for you Knee exercises include:  Leg Lifts - While your knee is still immobilized in a splint or cast, you can do  straight leg raises. Lift the leg to 60 degrees, hold for 3 sec, and slowly lower the leg. Repeat 10-20 times 2-3 times daily. Perform this exercise against resistance later as your knee gets better.  Quad and Hamstring Sets - Tighten up the muscle on the front of the thigh (Quad) and hold for 5-10 sec. Repeat this 10-20 times hourly. Hamstring sets are done by pushing the foot backward against an object and holding for 5-10 sec. Repeat as with quad sets.  A rehabilitation program following serious knee injuries can speed recovery and prevent re-injury in the future due to weakened muscles. Contact your doctor or a physical therapist for more information on knee rehabilitation.   SKILLED REHAB INSTRUCTIONS: If the patient is transferred to a skilled rehab facility following release from the hospital, a list of the current medications will be sent to the facility for the patient to continue.  When discharged from the skilled rehab facility, please have the facility set up the patient's [place] prior to being released. Also, the skilled facility will be responsible for providing the patient with their medications at time of release from the facility to include their pain medication, the muscle relaxants, and their blood thinner medication. If the patient is still at the rehab facility at time of the two week follow up appointment, the skilled rehab facility will also need to assist the patient in arranging follow up appointment in our office and any transportation needs.  MAKE SURE YOU:  Understand these instructions.  Will watch your condition.  Will get help right away if you are not doing well or get worse.    Pick up stool softner and laxative for home use following surgery while on pain medications. Do not submerge incision under water. Please use good hand washing techniques while changing dressing each day. May shower starting three days after surgery. Please use a clean  towel to pat the incision dry following showers. Continue to use ice for pain and swelling after surgery. Do not use any lotions or creams on the incision until instructed by your surgeon.  INSTRUCTIONS AFTER JOINT REPLACEMENT   Remove items at home which could result in a fall. This includes throw rugs or furniture in walking pathways ICE to the affected joint every three hours while awake for 30 minutes at a time, for at least the first 3-5 days, and then as needed for pain and swelling.  Continue to use ice for pain and swelling. You may notice swelling that will progress down to the foot and ankle.  This is normal after surgery.  Elevate your leg when you are not up walking on it.   Continue to use the breathing machine you got in the hospital (incentive spirometer) which will help keep your temperature down.  It is common for your temperature to cycle up and down following surgery, especially at night when you are not up moving around and  exerting yourself.  The breathing machine keeps your lungs expanded and your temperature down.   DIET:  As you were doing prior to hospitalization, we recommend a well-balanced diet.  DRESSING / WOUND CARE / SHOWERING  You may change your dressing after surgery.  Then change the dressing every day with sterile gauze.  Please use good hand washing techniques before changing the dressing.  Do not use any lotions or creams on the incision until instructed by your surgeon. and You may shower 3 days after surgery, but keep the wounds dry during showering.  You may use an occlusive plastic wrap (Press'n Seal for example), NO SOAKING/SUBMERGING IN THE BATHTUB.  If the bandage gets wet, change with a clean dry gauze.  If the incision gets wet, pat the wound dry with a clean towel.  ACTIVITY  Increase activity slowly as tolerated, but follow the weight bearing instructions below.   No driving for 6 weeks or until further direction given by your physician.  You  cannot drive while taking narcotics.  No lifting or carrying greater than 10 lbs. until further directed by your surgeon. Avoid periods of inactivity such as sitting longer than an hour when not asleep. This helps prevent blood clots.  You may return to work once you are authorized by your doctor.     WEIGHT BEARING   Weight bearing as tolerated with assist device (walker, cane, etc) as directed, use it as long as suggested by your surgeon or therapist, typically at least 4-6 weeks.   EXERCISES  Results after joint replacement surgery are often greatly improved when you follow the exercise, range of motion and muscle strengthening exercises prescribed by your doctor. Safety measures are also important to protect the joint from further injury. Any time any of these exercises cause you to have increased pain or swelling, decrease what you are doing until you are comfortable again and then slowly increase them. If you have problems or questions, call your caregiver or physical therapist for advice.   Rehabilitation is important following a joint replacement. After just a few days of immobilization, the muscles of the leg can become weakened and shrink (atrophy).  These exercises are designed to build up the tone and strength of the thigh and leg muscles and to improve motion. Often times heat used for twenty to thirty minutes before working out will loosen up your tissues and help with improving the range of motion but do not use heat for the first two weeks following surgery (sometimes heat can increase post-operative swelling).   These exercises can be done on a training (exercise) mat, on the floor, on a table or on a bed. Use whatever works the best and is most comfortable for you.    Use music or television while you are exercising so that the exercises are a pleasant break in your day. This will make your life better with the exercises acting as a break in your routine that you can look forward  to.   Perform all exercises about fifteen times, three times per day or as directed.  You should exercise both the operative leg and the other leg as well.   Exercises include:   Quad Sets - Tighten up the muscle on the front of the thigh (Quad) and hold for 5-10 seconds.   Straight Leg Raises - With your knee straight (if you were given a brace, keep it on), lift the leg to 60 degrees, hold for 3 seconds, and slowly  lower the leg.  Perform this exercise against resistance later as your leg gets stronger.  Leg Slides: Lying on your back, slowly slide your foot toward your buttocks, bending your knee up off the floor (only go as far as is comfortable). Then slowly slide your foot back down until your leg is flat on the floor again.  Angel Wings: Lying on your back spread your legs to the side as far apart as you can without causing discomfort.  Hamstring Strength:  Lying on your back, push your heel against the floor with your leg straight by tightening up the muscles of your buttocks.  Repeat, but this time bend your knee to a comfortable angle, and push your heel against the floor.  You may put a pillow under the heel to make it more comfortable if necessary.   A rehabilitation program following joint replacement surgery can speed recovery and prevent re-injury in the future due to weakened muscles. Contact your doctor or a physical therapist for more information on knee rehabilitation.    CONSTIPATION  Constipation is defined medically as fewer than three stools per week and severe constipation as less than one stool per week.  Even if you have a regular bowel pattern at home, your normal regimen is likely to be disrupted due to multiple reasons following surgery.  Combination of anesthesia, postoperative narcotics, change in appetite and fluid intake all can affect your bowels.   YOU MUST use at least one of the following options; they are listed in order of increasing strength to get the job  done.  They are all available over the counter, and you may need to use some, POSSIBLY even all of these options:    Drink plenty of fluids (prune juice may be helpful) and high fiber foods Colace 100 mg by mouth twice a day  Senokot for constipation as directed and as needed Dulcolax (bisacodyl), take with full glass of water  Miralax (polyethylene glycol) once or twice a day as needed.  If you have tried all these things and are unable to have a bowel movement in the first 3-4 days after surgery call either your surgeon or your primary doctor.    If you experience loose stools or diarrhea, hold the medications until you stool forms back up.  If your symptoms do not get better within 1 week or if they get worse, check with your doctor.  If you experience "the worst abdominal pain ever" or develop nausea or vomiting, please contact the office immediately for further recommendations for treatment.   ITCHING:  If you experience itching with your medications, try taking only a single pain pill, or even half a pain pill at a time.  You can also use Benadryl over the counter for itching or also to help with sleep.   TED HOSE STOCKINGS:  Use stockings on both legs until for at least 2 weeks or as directed by physician office. They may be removed at night for sleeping.  MEDICATIONS:  See your medication summary on the After Visit Summary that nursing will review with you.  You may have some home medications which will be placed on hold until you complete the course of blood thinner medication.  It is important for you to complete the blood thinner medication as prescribed.  PRECAUTIONS:  If you experience chest pain or shortness of breath - call 911 immediately for transfer to the hospital emergency department.   If you develop a fever greater that 101  F, purulent drainage from wound, increased redness or drainage from wound, foul odor from the wound/dressing, or calf pain - CONTACT YOUR SURGEON.                                                    FOLLOW-UP APPOINTMENTS:  If you do not already have a post-op appointment, please call the office for an appointment to be seen by your surgeon.  Guidelines for how soon to be seen are listed in your After Visit Summary, but are typically between 1-4 weeks after surgery.    MAKE SURE YOU:  Understand these instructions.  Get help right away if you are not doing well or get worse.    Thank you for letting us be a part of your medical care team.  It is a privilege we respect greatly.  We hope these instructions will help you stay on track for a fast and full recovery!   Information on my medicine - XARELTO (Rivaroxaban)  This medication education was reviewed with me or my healthcare representative as part of my discharge preparation.  The pharmacist that spoke with me during my hospital stay was:  Kara Mead, [facility]  Why was Xarelto prescribed for you? Xarelto was prescribed for you to reduce the risk of blood clots forming after orthopedic surgery. The medical term for these abnormal blood clots is venous thromboembolism (VTE).  What do you need to know about xarelto ? Take your Xarelto ONCE DAILY at the same time every day. You may take it either with or without food.  If you have difficulty swallowing the tablet whole, you may crush it and mix in applesauce just prior to taking your dose.  Take Xarelto exactly as prescribed by your doctor and DO NOT stop taking Xarelto without talking to the doctor who prescribed the medication.  Stopping without other VTE prevention medication to take the place of Xarelto may increase your risk of developing a clot.  After discharge, you should have regular check-up appointments with your healthcare provider that is prescribing your Xarelto.    What do you do if you miss a dose? If you miss a dose, take it as soon as you remember on the same day then continue your regularly scheduled  once daily regimen the next day. Do not take two doses of Xarelto on the same day.   Important Safety Information A possible side effect of Xarelto is bleeding. You should call your healthcare provider right away if you experience any of the following: ? Bleeding from an injury or your nose that does not stop. ? Unusual colored urine (red or dark brown) or unusual colored stools (red or black). ? Unusual bruising for unknown reasons. ? A serious fall or if you hit your head (even if there is no bleeding).  Some medicines may interact with Xarelto and might increase your risk of bleeding while on Xarelto. To help avoid this, consult your healthcare provider or pharmacist prior to using any new prescription or non-prescription medications, including herbals, vitamins, non-steroidal anti-inflammatory drugs (NSAIDs) and supplements.  This website has more information on Xarelto: [email or web address]

## 2014-05-23 NOTE — Plan of Care (Signed)
Problem: Consults Goal: Diagnosis- Total Joint Replacement Outcome: Completed/Met Date Met:  05/23/14 Primary Total Knee LEFT  Problem: Phase II Progression Outcomes Goal: Discharge plan established Outcome: Completed/Met Date Met:  05/23/14 [facility]

## 2014-05-23 NOTE — Evaluation (Signed)
Physical Therapy Evaluation Patient Details Name: Kayla Stokes MRN: [id number] DOB: 31-Jan-1946 Today's Date: 05/23/2014   History of Present Illness  69 yo female with new TKA LLE and PMHx:  pancreatitis, hiatal hernia, HLD, anxiety  Clinical Impression  Pt was assessed to walk and do ROM L knee with good starting information, but will need to focus on ROM L knee, recovery of strength to be able to discharge Immobilizer.  AAROM to SLR and will anticipate going to SNF without needing immobilizer.    Follow Up Recommendations SNF;Supervision/Assistance - 24 hour    Equipment Recommendations  Other (comment) (await disposition from [facility])    Recommendations for Other Services Rehab consult     Precautions / Restrictions Precautions Precautions: Knee;Fall Restrictions Weight Bearing Restrictions: No      Mobility  Bed Mobility Overal bed mobility: Needs Assistance Bed Mobility: Supine to Sit     Supine to sit: Min assist;HOB elevated (assisted LLE in immobilizer)        Transfers Overall transfer level: Needs assistance Equipment used: Rolling walker (2 wheeled) Transfers: Sit to/from [facility] Sit to Stand: Min guard;Min assist Stand pivot transfers: Min guard       General transfer comment: reminders for hand placement  Ambulation/Gait Ambulation/Gait assistance: Min guard;Min assist Ambulation Distance (Feet): 125 Feet Assistive device: Rolling walker (2 wheeled) Gait Pattern/deviations: Step-through pattern;Wide base of support;Trunk flexed;Drifts right/left Gait velocity: reduced Gait velocity interpretation: Below normal speed for age/gender General Gait Details: in L knee immobilizer with step to progressing to step through with cues  Stairs            Wheelchair Mobility    Modified Rankin (Stroke Patients Only)       Balance Overall balance assessment: Needs assistance Sitting-balance support: Feet supported Sitting  balance-Leahy Scale: Good   Postural control: Posterior lean Standing balance support: Bilateral upper extremity supported Standing balance-Leahy Scale: Fair Standing balance comment: fair- dynamic balance                             Pertinent Vitals/Pain Pain Assessment: 0-10 Pain Score: 7  Pain Location: L knee Pain Intervention(s): Limited activity within patient's tolerance;Monitored during session;Premedicated before session;Repositioned;Ice applied  BP was 100/56, pulse 62 and O2 sats 100%.    Home Living Family/patient expects to be discharged to:: Skilled nursing facility Living Arrangements: Spouse/significant other                    Prior Function Level of Independence: Independent with assistive device(s)               Hand Dominance        Extremity/Trunk Assessment   Upper Extremity Assessment: Overall WFL for tasks assessed           Lower Extremity Assessment: LLE deficits/detail   LLE Deficits / Details: Stiffness with ROM -16 and flexion 68 deg AAROM  Cervical / Trunk Assessment: Normal  Communication   Communication: No difficulties  Cognition Arousal/Alertness: Awake/alert Behavior During Therapy: WFL for tasks assessed/performed Overall Cognitive Status: Within Functional Limits for tasks assessed                      General Comments General comments (skin integrity, edema, etc.): Pt is asking to run to BR and was very motivated to get moving and walking.  Her plan is to go to SNF which is very appropriate  for her situation, and will then go home with husband.    Exercises Total Joint Exercises Ankle Circles/Pumps: AROM;Both;5 reps Quad Sets: AROM;Both;5 reps Heel Slides: AAROM;5 reps;Left Straight Leg Raises: AROM;Left;10 reps Long Arc Quad: Left;AAROM Goniometric ROM: -16 and 68 deg L knee AAROM      Assessment/Plan    PT Assessment Patient needs continued PT services  PT Diagnosis Difficulty  walking;Acute pain   PT Problem List Decreased strength;Decreased range of motion;Decreased activity tolerance;Decreased balance;Decreased mobility;Decreased coordination;Decreased knowledge of use of DME;Decreased knowledge of precautions;Decreased skin integrity;Pain  PT Treatment Interventions DME instruction;Gait training;Stair training;Functional mobility training;Therapeutic activities;Therapeutic exercise;Balance training;Neuromuscular re-education;Patient/family education   PT Goals (Current goals can be found in the Care Plan section) Acute Rehab PT Goals Patient Stated Goal: To get home PT Goal Formulation: With patient/family Time For Goal Achievement: 06/06/14 Potential to Achieve Goals: Good    Frequency 7X/week   Barriers to discharge Inaccessible home environment      Co-evaluation               End of Session   Activity Tolerance: Patient tolerated treatment well;Patient limited by pain Patient left: in chair;with call bell/phone within reach;with family/visitor present Nurse Communication: Mobility status         Time: 1423-9532 PT Time Calculation (min) (ACUTE ONLY): 25 min   Charges:   PT Evaluation $Initial PT Evaluation Tier I: 1 Procedure PT Treatments $Gait Training: 8-22 mins   PT G CodesRamond Dial 05-24-14, 10:18 AM   Mee Hives, PT MS Acute Rehab Dept. Number: [phone]

## 2014-05-23 NOTE — Care Management Note (Signed)
    Page 1 of 1   05/23/2014     3:45:52 PM CARE MANAGEMENT NOTE 05/23/2014  Patient:  Kayla Stokes, Kayla Stokes   Account Number:  [id number]  Date Initiated:  05/23/2014  Documentation initiated by:  [facility]  Subjective/Objective Assessment:   adm: LEFT TOTAL KNEE ARTHROPLASTY (Left)     Action/Plan:   discharge planning   Anticipated DC Date:  05/24/2014   Anticipated DC Plan:  [place]  CM consult      Choice offered to / List presented to:             Status of service:  Completed, signed off Medicare Important Message given?   (If response is "NO", the following Medicare IM given date fields will be blank) Date Medicare IM given:   Medicare IM given by:   Date Additional Medicare IM given:   Additional Medicare IM given by:    Discharge Disposition:  [place]  Per UR Regulation:    If discussed at Long Length of Stay Meetings, dates discussed:    Comments:  05/23/14 15:40 Cm notes pt to go to SNF; CSW arranging.  No other CM needs were communicated.  Mariane Masters, BSN, CM [phone].

## 2014-05-23 NOTE — Progress Notes (Signed)
Physical Therapy Treatment Patient Details Name: CHENOAH MCNALLY MRN: [id number] DOB: 09/20/1945 Today's Date: 05/23/2014    History of Present Illness 69 yo female with new TKA LLE and PMHx:  pancreatitis, hiatal hernia, HLD, anxiety    PT Comments    Patient  Progressing well, recommend SNF as  Patient will need to be safe modified independent.  Follow Up Recommendations        Equipment Recommendations       Recommendations for Other Services       Precautions / Restrictions Precautions Precautions: Knee;Fall Required Braces or Orthoses: Knee Immobilizer - Left Restrictions Weight Bearing Restrictions: No    Mobility  Bed Mobility Overal bed mobility: Needs Assistance Bed Mobility: Sit to Supine     Supine to sit: Min assist;HOB elevated Sit to supine: Min guard   General bed mobility comments: cues  to lift   with  R leg  Transfers Overall transfer level: Needs assistance Equipment used: Rolling walker (2 wheeled) Transfers: Sit to/from Stand Sit to Stand: Min guard         General transfer comment: cues for technique, safety with RW.  Ambulation/Gait Ambulation/Gait assistance: Supervision Ambulation Distance (Feet): 100 Feet Assistive device: Rolling walker (2 wheeled) Gait Pattern/deviations: Decreased step length - left;Decreased stance time - left;Step-to pattern Gait velocity: reduced   General Gait Details: in L knee immobilizer with step to progressing to step through with cues   Stairs            Wheelchair Mobility    Modified Rankin (Stroke Patients Only)       Balance                                    Cognition Arousal/Alertness: Awake/alert Behavior During Therapy: WFL for tasks assessed/performed Overall Cognitive Status: Within Functional Limits for tasks assessed                      Exercises      General Comments        Pertinent Vitals/Pain Pain Assessment: 0-10 Pain Score: 6   Pain Location: L knee, behind and  above knee Pain Descriptors / Indicators: Aching;Tightness;Discomfort Pain Intervention(s): Limited activity within patient's tolerance;Monitored during session;Premedicated before session;Ice applied;Repositioned    Home Living Family/patient expects to be discharged to:: Skilled nursing facility Living Arrangements: Spouse/significant other                  Prior Function Level of Independence: Independent with assistive device(s)          PT Goals (current goals can now be found in the care plan section) Acute Rehab PT Goals Patient Stated Goal: To get home Progress towards PT goals: Progressing toward goals    Frequency  7X/week    PT Plan Current plan remains appropriate    Co-evaluation             End of Session   Activity Tolerance: Patient tolerated treatment well Patient left: in bed;with call bell/phone within reach     Time: 1438-1451 PT Time Calculation (min) (ACUTE ONLY): 13 min  Charges:  $Gait Training: 8-22 mins                    G Codes:      Claretha Cooper 05/23/2014, 3:12 PM

## 2014-05-23 NOTE — Evaluation (Signed)
Occupational Therapy Evaluation Patient Details Name: NOVALIE LEAMY MRN: [id number] DOB: 03/11/1945 Today's Date: 05/23/2014    History of Present Illness 69 yo female with new TKA LLE and PMHx:  pancreatitis, hiatal hernia, HLD, anxiety   Clinical Impression   Pt was independent with assistive device prior to admission.  Pt requiring min assist currently due to impaired balance, pain and generalized weakness.  Pt with plans to discharge to SNF for post acute rehab.  Will defer further OT to SNF.   Follow Up Recommendations  SNF;Supervision/Assistance - 24 hour    Equipment Recommendations       Recommendations for Other Services       Precautions / Restrictions Precautions Precautions: Knee;Fall Required Braces or Orthoses: Knee Immobilizer - Left Restrictions Weight Bearing Restrictions: No      Mobility Bed Mobility Overal bed mobility: Needs Assistance Bed Mobility: Supine to Sit;Sit to Supine     Supine to sit: Min assist;HOB elevated Sit to supine: Min assist   General bed mobility comments: assisted L LE  Transfers Overall transfer level: Needs assistance Equipment used: Rolling walker (2 wheeled) Transfers: Sit to/from Stand Sit to Stand: Min guard;Min assist         General transfer comment: cues for technique    Balance                                            ADL Overall ADL's : Needs assistance/impaired Eating/Feeding: Independent;Sitting   Grooming: Wash/dry hands;Min guard;Standing   Upper Body Bathing: Set up;Sitting   Lower Body Bathing: Minimal assistance;Sit to/from stand   Upper Body Dressing : Set up;Sitting   Lower Body Dressing: Minimal assistance;Sit to/from stand   Toilet Transfer: Min guard;Ambulation;RW   Toileting- Water quality scientist and Hygiene: Min guard;Sit to/from stand               Vision     Perception     Praxis      Pertinent Vitals/Pain Pain Assessment: 0-10 Pain Score:  4  Pain Location: L knee Pain Descriptors / Indicators: Burning Pain Intervention(s): Premedicated before session;Monitored during session;Repositioned;Ice applied     Hand Dominance Right   Extremity/Trunk Assessment Upper Extremity Assessment Upper Extremity Assessment: Overall WFL for tasks assessed   Lower Extremity Assessment Lower Extremity Assessment: Defer to PT evaluation   Cervical / Trunk Assessment Cervical / Trunk Assessment: Normal   Communication Communication Communication: No difficulties   Cognition Arousal/Alertness: Awake/alert Behavior During Therapy: WFL for tasks assessed/performed Overall Cognitive Status: Within Functional Limits for tasks assessed                     General Comments       Exercises       Shoulder Instructions      Home Living Family/patient expects to be discharged to:: Skilled nursing facility Living Arrangements: Spouse/significant other                                      Prior Functioning/Environment Level of Independence: Independent with assistive device(s)             OT Diagnosis: Generalized weakness;Acute pain   OT Problem List:     OT Treatment/Interventions:      OT Goals(Current goals can be found  in the care plan section) Acute Rehab OT Goals Patient Stated Goal: To get home  OT Frequency:     Barriers to D/C:            Co-evaluation              End of Session Equipment Utilized During Treatment: Gait belt;Rolling walker;Left knee immobilizer Nurse Communication:  (pt voided)  Activity Tolerance: Patient tolerated treatment well Patient left: in bed;with call bell/phone within reach;with family/visitor present   Time: 1244-1300 OT Time Calculation (min): 16 min Charges:  OT General Charges $OT Visit: 1 Procedure OT Evaluation $Initial OT Evaluation Tier I: 1 Procedure G-Codes:    Malka So 05/23/2014, 1:07 PM  [phone]

## 2014-05-24 LAB — BASIC METABOLIC PANEL
ANION GAP: 6 (ref 5–15)
BUN: 18 mg/dL (ref 6–23)
CALCIUM: 8.1 mg/dL — AB (ref 8.4–10.5)
CHLORIDE: 103 mmol/L (ref 96–112)
CO2: 28 mmol/L (ref 19–32)
CREATININE: 0.91 mg/dL (ref 0.50–1.10)
GFR calc Af Amer: 75 mL/min — ABNORMAL LOW (ref >90)
GFR calc non Af Amer: 64 mL/min — ABNORMAL LOW (ref >90)
GLUCOSE: 108 mg/dL — AB (ref 70–99)
Potassium: 3.8 mmol/L (ref 3.5–5.1)
SODIUM: 137 mmol/L (ref 135–145)

## 2014-05-24 LAB — CBC
HCT: 31.7 % — ABNORMAL LOW (ref 36.0–46.0)
Hemoglobin: 10.4 g/dL — ABNORMAL LOW (ref 12.0–15.0)
MCH: 30.4 pg (ref 26.0–34.0)
MCHC: 32.8 g/dL (ref 30.0–36.0)
MCV: 92.7 fL (ref 78.0–100.0)
Platelets: 246 10*3/uL (ref 150–400)
RBC: 3.42 MIL/uL — ABNORMAL LOW (ref 3.87–5.11)
RDW: 14.1 % (ref 11.5–15.5)
WBC: 14.1 10*3/uL — ABNORMAL HIGH (ref 4.0–10.5)

## 2014-05-24 NOTE — Progress Notes (Signed)
Physical Therapy Treatment Patient Details Name: Kayla Stokes MRN: [id number] DOB: [birth year] Today's Date: 05/24/2014    History of Present Illness 69 yo female with new TKA LLE and PMHx:  pancreatitis, hiatal hernia, HLD, anxiety    PT Comments    Patient complaining of increased [pain, swelling. Returned to supine, elevated Leg and ice placed onto knee. Will return and  assist with exercises.  Follow Up Recommendations  SNF;Supervision/Assistance - 24 hour     Equipment Recommendations       Recommendations for Other Services Rehab consult     Precautions / Restrictions Precautions Precautions: Knee;Fall Required Braces or Orthoses: Knee Immobilizer - Left    Mobility  Bed Mobility Overal bed mobility: Needs Assistance         Sit to supine: Min assist   General bed mobility comments: cues  to lift   with  R leg, having more pain and needs extra help with LLE  Transfers Overall transfer level: Needs assistance Equipment used: Rolling walker (2 wheeled) Transfers: Sit to/from Stand Sit to Stand: Min guard         General transfer comment: cues for technique, safety with RW.  Ambulation/Gait Ambulation/Gait assistance: Min guard Ambulation Distance (Feet): 20 Feet (x2) Assistive device: Rolling walker (2 wheeled) Gait Pattern/deviations: Step-to pattern;Antalgic;Decreased step length - left;Decreased stance time - left     General Gait Details: much increase in pain today, returned to bed and ice placed.   Stairs            Wheelchair Mobility    Modified Rankin (Stroke Patients Only)       Balance                                    Cognition Arousal/Alertness: Awake/alert Behavior During Therapy: Anxious                        Exercises      General Comments        Pertinent Vitals/Pain Pain Score: 9  Pain Location: L knee, behind knee Pain Descriptors / Indicators:  Aching;Constant;Cramping;Dull;Crying Pain Intervention(s): Limited activity within patient's tolerance;Monitored during session;Patient requesting pain meds-RN notified;Ice applied    Home Living                      Prior Function            PT Goals (current goals can now be found in the care plan section) Progress towards PT goals: Progressing toward goals    Frequency  7X/week    PT Plan Current plan remains appropriate    Co-evaluation             End of Session   Activity Tolerance: Patient limited by pain Patient left: in bed;with call bell/phone within reach;with family/visitor present     Time: 2094-7096 PT Time Calculation (min) (ACUTE ONLY): 20 min  Charges:  $Gait Training: 8-22 mins                    G Codes:      Claretha Cooper 05/24/2014, 3:36 PM

## 2014-05-24 NOTE — Progress Notes (Signed)
Physical Therapy Treatment Patient Details Name: Kayla Stokes MRN: [id number] DOB: 08-Oct-1945 Today's Date: 05/24/2014    History of Present Illness 69 yo female with new TKA LLE and PMHx:  pancreatitis, hiatal hernia, HLD, anxiety    PT Comments    Pain improved/ tolerated exercises. For SNF today.  Follow Up Recommendations  SNF;Supervision/Assistance - 24 hour     Equipment Recommendations       Recommendations for Other Services Rehab consult     Precautions / Restrictions Precautions Precautions: Knee;Fall Required Braces or Orthoses: Knee Immobilizer - Left    Mobility  Bed Mobility Overal bed mobility: Needs Assistance Bed Mobility: Supine to Sit     Supine to sit: Min guard Sit to supine: Min assist   General bed mobility comments: used R foot to support l to movee to edge  Transfers Overall transfer level: Needs assistance Equipment used: Rolling walker (2 wheeled) Transfers: Sit to/from Stand Sit to Stand: Min guard Stand pivot transfers: Min guard       General transfer comment: cues for technique, safety with RW.  Ambulation/Gait Ambulation/Gait assistance: Min guard Ambulation Distance (Feet): 20 Feet (x2) Assistive device: Rolling walker (2 wheeled) Gait Pattern/deviations: Step-to pattern;Antalgic;Decreased step length - left;Decreased stance time - left     General Gait Details: much increase in pain today, returned to bed and ice placed.   Stairs            Wheelchair Mobility    Modified Rankin (Stroke Patients Only)       Balance                                    Cognition Arousal/Alertness: Awake/alert Behavior During Therapy: Anxious                        Exercises Total Joint Exercises Ankle Circles/Pumps: AROM;Both;5 reps Quad Sets: AROM;Both;5 reps Towel Squeeze: AAROM;Both;10 reps;Supine Short Arc Quad: AAROM;5 reps;Supine;Left Heel Slides: AAROM;Left;10 reps Hip  ABduction/ADduction: AAROM;Left;10 reps;Supine Straight Leg Raises: Left;10 reps;AAROM;Supine Goniometric ROM: -16-50    General Comments        Pertinent Vitals/Pain Pain Score: 9  Pain Location: L knee, behind knee Pain Descriptors / Indicators: Aching;Constant;Cramping;Dull;Crying Pain Intervention(s): Limited activity within patient's tolerance;Monitored during session;Patient requesting pain meds-RN notified;Ice applied    Home Living                      Prior Function            PT Goals (current goals can now be found in the care plan section) Progress towards PT goals: Progressing toward goals    Frequency  7X/week    PT Plan Current plan remains appropriate    Co-evaluation             End of Session   Activity Tolerance: Patient tolerated treatment well Patient left: in chair;with call bell/phone within reach;with family/visitor present     Time: 6759-1638 PT Time Calculation (min) (ACUTE ONLY): 24 min  Charges:  $Gait Training: 8-22 mins $Therapeutic Exercise: 8-22 mins                    G Codes:      Claretha Cooper 05/24/2014, 3:39 PM

## 2014-05-24 NOTE — Discharge Summary (Signed)
Physician Discharge Summary   Patient ID: Kayla Stokes MRN: [id number] DOB/AGE: Oct 02, 1945 69 y.o.  Admit date: 05/22/2014 Discharge date: 05/24/2014  Primary Diagnosis: Primary osteoarthritis, left knee  Admission Diagnoses:  Past Medical History  Diagnosis Date  . High cholesterol   . Arthritis     finger  . Eczema     elbow  . Hypertension     under control, has been on med. x 10 yrs.  . Dental crowns present   . Mass of hand 05/2011    right  . Pancreatitis   . H. pylori infection   . Diverticulosis   . Allergic rhinitis   . Anxiety   . Osteopenia 11/2012    T score -1.1 left forearm FRAX 6.2%/0.1%  . Complication of anesthesia     difficult to awaken after gallbladder surgery  . GERD (gastroesophageal reflux disease)   . History of hiatal hernia   . Trochanteric bursitis of left hip   . Varicose veins   . Menopause   . Gastritis     hx of   . Measles     hx of   . Mumps     hx of   . Rubella     hx of   . Bursitis of right hip    Discharge Diagnoses:   Principal Problem:   OA (osteoarthritis) of knee  Estimated body mass index is 28.35 kg/(m^2) as calculated from the following:   Height as of this encounter: 5' 3"  (1.6 m).   Weight as of this encounter: 72.576 kg (160 lb).  Procedure:  Procedure(s) (LRB): LEFT TOTAL KNEE ARTHROPLASTY (Left)   Consults: None  HPI: The patient presented with the chief complaint of left knee pain. Unfortunately, that left knee is getting progressively worse despite conservative treatment. It is now starting to cause pain in the right hip because of her altered gait. The knee is hurting at all times. It is limiting what she can and cannot do. She has had injections in the past without any long lasting benefit. They have been treated conservatively in the past for the above stated problem and despite conservative measures, they continue to have progressive pain and severe functional limitations and dysfunction. They have  failed non-operative management including home exercise, medications, and injections. It is felt that they would benefit from undergoing total joint replacement. Risks and benefits of the procedure have been discussed with the patient and they elect to proceed with surgery. There are no active contraindications to surgery such as ongoing infection or rapidly progressive neurological disease.  Laboratory Data: Admission on 05/22/2014  Component Date Value Ref Range Status  . ABO/RH(D) 05/22/2014 A POS   Final  . Antibody Screen 05/22/2014 NEG   Final  . Sample Expiration 05/22/2014 05/25/2014   Final  . ABO/RH(D) 05/22/2014 A POS   Final  . WBC 05/23/2014 14.3* 4.0 - 10.5 K/uL Final  . RBC 05/23/2014 3.75* 3.87 - 5.11 MIL/uL Final  . Hemoglobin 05/23/2014 11.5* 12.0 - 15.0 g/dL Final  . HCT 05/23/2014 34.1* 36.0 - 46.0 % Final  . MCV 05/23/2014 90.9  78.0 - 100.0 fL Final  . MCH 05/23/2014 30.7  26.0 - 34.0 pg Final  . MCHC 05/23/2014 33.7  30.0 - 36.0 g/dL Final  . RDW 05/23/2014 13.6  11.5 - 15.5 % Final  . Platelets 05/23/2014 269  150 - 400 K/uL Final  . Sodium 05/23/2014 133* 135 - 145 mmol/L Final  . Potassium 05/23/2014  3.2* 3.5 - 5.1 mmol/L Final  . Chloride 05/23/2014 97  96 - 112 mmol/L Final  . CO2 05/23/2014 28  19 - 32 mmol/L Final  . Glucose, Bld 05/23/2014 139* 70 - 99 mg/dL Final  . BUN 05/23/2014 18  6 - 23 mg/dL Final  . Creatinine, Ser 05/23/2014 1.02  0.50 - 1.10 mg/dL Final  . Calcium 05/23/2014 8.2* 8.4 - 10.5 mg/dL Final  . GFR calc non Af Amer 05/23/2014 56* >90 mL/min Final  . GFR calc Af Amer 05/23/2014 65* >90 mL/min Final   Comment: (NOTE) The eGFR has been calculated using the CKD EPI equation. This calculation has not been validated in all clinical situations. eGFR's persistently <90 mL/min signify possible Chronic Kidney Disease.   . Anion gap 05/23/2014 8  5 - 15 Final  . WBC 05/24/2014 14.1* 4.0 - 10.5 K/uL Final  . RBC 05/24/2014 3.42* 3.87 - 5.11  MIL/uL Final  . Hemoglobin 05/24/2014 10.4* 12.0 - 15.0 g/dL Final  . HCT 05/24/2014 31.7* 36.0 - 46.0 % Final  . MCV 05/24/2014 92.7  78.0 - 100.0 fL Final  . MCH 05/24/2014 30.4  26.0 - 34.0 pg Final  . MCHC 05/24/2014 32.8  30.0 - 36.0 g/dL Final  . RDW 05/24/2014 14.1  11.5 - 15.5 % Final  . Platelets 05/24/2014 246  150 - 400 K/uL Final  . Sodium 05/24/2014 137  135 - 145 mmol/L Final  . Potassium 05/24/2014 3.8  3.5 - 5.1 mmol/L Final  . Chloride 05/24/2014 103  96 - 112 mmol/L Final  . CO2 05/24/2014 28  19 - 32 mmol/L Final  . Glucose, Bld 05/24/2014 108* 70 - 99 mg/dL Final  . BUN 05/24/2014 18  6 - 23 mg/dL Final  . Creatinine, Ser 05/24/2014 0.91  0.50 - 1.10 mg/dL Final  . Calcium 05/24/2014 8.1* 8.4 - 10.5 mg/dL Final  . GFR calc non Af Amer 05/24/2014 64* >90 mL/min Final  . GFR calc Af Amer 05/24/2014 75* >90 mL/min Final   Comment: (NOTE) The eGFR has been calculated using the CKD EPI equation. This calculation has not been validated in all clinical situations. eGFR's persistently <90 mL/min signify possible Chronic Kidney Disease.   Georgiann Hahn gap 05/24/2014 6  5 - 15 Final  Hospital Outpatient Visit on 05/08/2014  Component Date Value Ref Range Status  . MRSA, PCR 05/08/2014 NEGATIVE  NEGATIVE Final  . Staphylococcus aureus 05/08/2014 NEGATIVE  NEGATIVE Final   Comment:        The Xpert SA Assay (FDA approved for NASAL specimens in patients over 77 years of age), is one component of a comprehensive surveillance program.  Test performance has been validated by [facility] for patients greater than or equal to 37 year old. It is not intended to diagnose infection nor to guide or monitor treatment.   Marland Kitchen aPTT 05/08/2014 26  24 - 37 seconds Final  . WBC 05/08/2014 6.8  4.0 - 10.5 K/uL Final  . RBC 05/08/2014 4.42  3.87 - 5.11 MIL/uL Final  . Hemoglobin 05/08/2014 13.6  12.0 - 15.0 g/dL Final  . HCT 05/08/2014 41.0  36.0 - 46.0 % Final  . MCV 05/08/2014 92.8   78.0 - 100.0 fL Final  . MCH 05/08/2014 30.8  26.0 - 34.0 pg Final  . MCHC 05/08/2014 33.2  30.0 - 36.0 g/dL Final  . RDW 05/08/2014 14.0  11.5 - 15.5 % Final  . Platelets 05/08/2014 317  150 - 400 K/uL  Final  . Sodium 05/08/2014 138  135 - 145 mmol/L Final  . Potassium 05/08/2014 4.0  3.5 - 5.1 mmol/L Final  . Chloride 05/08/2014 98  96 - 112 mmol/L Final  . CO2 05/08/2014 31  19 - 32 mmol/L Final  . Glucose, Bld 05/08/2014 99  70 - 99 mg/dL Final  . BUN 05/08/2014 23  6 - 23 mg/dL Final  . Creatinine, Ser 05/08/2014 1.12* 0.50 - 1.10 mg/dL Final  . Calcium 05/08/2014 9.4  8.4 - 10.5 mg/dL Final  . Total Protein 05/08/2014 7.7  6.0 - 8.3 g/dL Final  . Albumin 05/08/2014 4.5  3.5 - 5.2 g/dL Final  . AST 05/08/2014 40* 0 - 37 U/L Final  . ALT 05/08/2014 44* 0 - 35 U/L Final  . Alkaline Phosphatase 05/08/2014 76  39 - 117 U/L Final  . Total Bilirubin 05/08/2014 1.0  0.3 - 1.2 mg/dL Final  . GFR calc non Af Amer 05/08/2014 50* >90 mL/min Final  . GFR calc Af Amer 05/08/2014 58* >90 mL/min Final   Comment: (NOTE) The eGFR has been calculated using the CKD EPI equation. This calculation has not been validated in all clinical situations. eGFR's persistently <90 mL/min signify possible Chronic Kidney Disease.   . Anion gap 05/08/2014 9  5 - 15 Final  . Prothrombin Time 05/08/2014 12.7  11.6 - 15.2 seconds Final  . INR 05/08/2014 0.94  0.00 - 1.49 Final  . Color, Urine 05/08/2014 YELLOW  YELLOW Final  . APPearance 05/08/2014 CLEAR  CLEAR Final  . Specific Gravity, Urine 05/08/2014 1.022  1.005 - 1.030 Final  . pH 05/08/2014 6.5  5.0 - 8.0 Final  . Glucose, UA 05/08/2014 NEGATIVE  NEGATIVE mg/dL Final  . Hgb urine dipstick 05/08/2014 NEGATIVE  NEGATIVE Final  . Bilirubin Urine 05/08/2014 NEGATIVE  NEGATIVE Final  . Ketones, ur 05/08/2014 NEGATIVE  NEGATIVE mg/dL Final  . Protein, ur 05/08/2014 NEGATIVE  NEGATIVE mg/dL Final  . Urobilinogen, UA 05/08/2014 0.2  0.0 - 1.0 mg/dL Final  .  Nitrite 05/08/2014 NEGATIVE  NEGATIVE Final  . Leukocytes, UA 05/08/2014 NEGATIVE  NEGATIVE Final   MICROSCOPIC NOT DONE ON URINES WITH NEGATIVE PROTEIN, BLOOD, LEUKOCYTES, NITRITE, OR GLUCOSE <1000 mg/dL.     X-Rays:No results found.  EKG: Orders placed or performed during the hospital encounter of 05/08/14  . EKG 12-Lead  . EKG 12-Lead     Hospital Course: Kayla Stokes is a 69 y.o. who was admitted to [facility]. They were brought to the operating room on 05/22/2014 and underwent Procedure(s): LEFT TOTAL KNEE ARTHROPLASTY.  Patient tolerated the procedure well and was later transferred to the recovery room and then to the orthopaedic floor for postoperative care.  They were given PO and IV analgesics for pain control following their surgery.  They were given 24 hours of postoperative antibiotics of  Anti-infectives    Start     Dose/Rate Route Frequency Ordered Stop   05/22/14 1600  ceFAZolin (ANCEF) IVPB 2 g/50 mL premix     2 g 100 mL/hr over 30 Minutes Intravenous Every 6 hours 05/22/14 1349 05/22/14 2241   05/22/14 0728  ceFAZolin (ANCEF) IVPB 2 g/50 mL premix     2 g 100 mL/hr over 30 Minutes Intravenous On call to O.R. 05/22/14 0354 05/22/14 1007     and started on DVT prophylaxis in the form of Xarelto.   PT and OT were ordered for total joint protocol.  Discharge planning consulted to  help with postop disposition and equipment needs.  Patient had a good night on the evening of surgery.  They started to get up OOB with therapy on day one. Hemovac drain was pulled without difficulty.  Continued to work with therapy into day two.  Dressing was changed on day two and the incision was clean and dry.  The patient had progressed with therapy and meeting their goals.  Incision was healing well.  Patient was seen in rounds and was ready to go to SNF   Diet: Cardiac diet Activity:WBAT Follow-up:in 2 weeks Disposition - Skilled nursing facility Discharged Condition:  stable   Discharge Instructions    Call MD / Call 911    Complete by:  As directed   If you experience chest pain or shortness of breath, CALL 911 and be transported to the hospital emergency room.  If you develope a fever above 101 F, pus (white drainage) or increased drainage or redness at the wound, or calf pain, call your surgeon's office.     Change dressing    Complete by:  As directed   Change dressing daily with sterile 4 x 4 inch gauze dressing and apply TED hose.     Constipation Prevention    Complete by:  As directed   Drink plenty of fluids.  Prune juice may be helpful.  You may use a stool softener, such as Colace (over the counter) 100 mg twice a day.  Use MiraLax (over the counter) for constipation as needed.     Diet - low sodium heart healthy    Complete by:  As directed      Discharge instructions    Complete by:  As directed   INSTRUCTIONS AFTER JOINT REPLACEMENT   Remove items at home which could result in a fall. This includes throw rugs or furniture in walking pathways ICE to the affected joint every three hours while awake for 30 minutes at a time, for at least the first 3-5 days, and then as needed for pain and swelling.  Continue to use ice for pain and swelling. You may notice swelling that will progress down to the foot and ankle.  This is normal after surgery.  Elevate your leg when you are not up walking on it.   Continue to use the breathing machine you got in the hospital (incentive spirometer) which will help keep your temperature down.  It is common for your temperature to cycle up and down following surgery, especially at night when you are not up moving around and exerting yourself.  The breathing machine keeps your lungs expanded and your temperature down.   DIET:  As you were doing prior to hospitalization, we recommend a well-balanced diet.  DRESSING / WOUND CARE / SHOWERING  You may change your dressing after surgery.  Then change the dressing every  day with sterile gauze.  Please use good hand washing techniques before changing the dressing.  Do not use any lotions or creams on the incision until instructed by your surgeon. and You may shower 3 days after surgery, but keep the wounds dry during showering.  You may use an occlusive plastic wrap (Press'n Seal for example), NO SOAKING/SUBMERGING IN THE BATHTUB.  If the bandage gets wet, change with a clean dry gauze.  If the incision gets wet, pat the wound dry with a clean towel.  ACTIVITY  Increase activity slowly as tolerated, but follow the weight bearing instructions below.   No driving for 6 weeks  or until further direction given by your physician.  You cannot drive while taking narcotics.  No lifting or carrying greater than 10 lbs. until further directed by your surgeon. Avoid periods of inactivity such as sitting longer than an hour when not asleep. This helps prevent blood clots.  You may return to work once you are authorized by your doctor.     WEIGHT BEARING   Weight bearing as tolerated with assist device (walker, cane, etc) as directed, use it as long as suggested by your surgeon or therapist, typically at least 4-6 weeks.   EXERCISES  Results after joint replacement surgery are often greatly improved when you follow the exercise, range of motion and muscle strengthening exercises prescribed by your doctor. Safety measures are also important to protect the joint from further injury. Any time any of these exercises cause you to have increased pain or swelling, decrease what you are doing until you are comfortable again and then slowly increase them. If you have problems or questions, call your caregiver or physical therapist for advice.   Rehabilitation is important following a joint replacement. After just a few days of immobilization, the muscles of the leg can become weakened and shrink (atrophy).  These exercises are designed to build up the tone and strength of the thigh  and leg muscles and to improve motion. Often times heat used for twenty to thirty minutes before working out will loosen up your tissues and help with improving the range of motion but do not use heat for the first two weeks following surgery (sometimes heat can increase post-operative swelling).   These exercises can be done on a training (exercise) mat, on the floor, on a table or on a bed. Use whatever works the best and is most comfortable for you.    Use music or television while you are exercising so that the exercises are a pleasant break in your day. This will make your life better with the exercises acting as a break in your routine that you can look forward to.   Perform all exercises about fifteen times, three times per day or as directed.  You should exercise both the operative leg and the other leg as well.   Exercises include:   Quad Sets - Tighten up the muscle on the front of the thigh (Quad) and hold for 5-10 seconds.   Straight Leg Raises - With your knee straight (if you were given a brace, keep it on), lift the leg to 60 degrees, hold for 3 seconds, and slowly lower the leg.  Perform this exercise against resistance later as your leg gets stronger.  Leg Slides: Lying on your back, slowly slide your foot toward your buttocks, bending your knee up off the floor (only go as far as is comfortable). Then slowly slide your foot back down until your leg is flat on the floor again.  Angel Wings: Lying on your back spread your legs to the side as far apart as you can without causing discomfort.  Hamstring Strength:  Lying on your back, push your heel against the floor with your leg straight by tightening up the muscles of your buttocks.  Repeat, but this time bend your knee to a comfortable angle, and push your heel against the floor.  You may put a pillow under the heel to make it more comfortable if necessary.   A rehabilitation program following joint replacement surgery can speed recovery  and prevent re-injury in the future due to  weakened muscles. Contact your doctor or a physical therapist for more information on knee rehabilitation.    CONSTIPATION  Constipation is defined medically as fewer than three stools per week and severe constipation as less than one stool per week.  Even if you have a regular bowel pattern at home, your normal regimen is likely to be disrupted due to multiple reasons following surgery.  Combination of anesthesia, postoperative narcotics, change in appetite and fluid intake all can affect your bowels.   YOU MUST use at least one of the following options; they are listed in order of increasing strength to get the job done.  They are all available over the counter, and you may need to use some, POSSIBLY even all of these options:    Drink plenty of fluids (prune juice may be helpful) and high fiber foods Colace 100 mg by mouth twice a day  Senokot for constipation as directed and as needed Dulcolax (bisacodyl), take with full glass of water  Miralax (polyethylene glycol) once or twice a day as needed.  If you have tried all these things and are unable to have a bowel movement in the first 3-4 days after surgery call either your surgeon or your primary doctor.    If you experience loose stools or diarrhea, hold the medications until you stool forms back up.  If your symptoms do not get better within 1 week or if they get worse, check with your doctor.  If you experience "the worst abdominal pain ever" or develop nausea or vomiting, please contact the office immediately for further recommendations for treatment.   ITCHING:  If you experience itching with your medications, try taking only a single pain pill, or even half a pain pill at a time.  You can also use Benadryl over the counter for itching or also to help with sleep.   TED HOSE STOCKINGS:  Use stockings on both legs until for at least 2 weeks or as directed by physician office. They may be removed  at night for sleeping.  MEDICATIONS:  See your medication summary on the "After Visit Summary" that nursing will review with you.  You may have some home medications which will be placed on hold until you complete the course of blood thinner medication.  It is important for you to complete the blood thinner medication as prescribed.  PRECAUTIONS:  If you experience chest pain or shortness of breath - call 911 immediately for transfer to the hospital emergency department.   If you develop a fever greater that 101 F, purulent drainage from wound, increased redness or drainage from wound, foul odor from the wound/dressing, or calf pain - CONTACT YOUR SURGEON.                                                   FOLLOW-UP APPOINTMENTS:  If you do not already have a post-op appointment, please call the office for an appointment to be seen by your surgeon.  Guidelines for how soon to be seen are listed in your "After Visit Summary", but are typically between 1-4 weeks after surgery.    MAKE SURE YOU:  Understand these instructions.  Get help right away if you are not doing well or get worse.    Thank you for letting us be a part of your medical care team.  It is a privilege we respect greatly.  We hope these instructions will help you stay on track for a fast and full recovery!     Increase activity slowly as tolerated    Complete by:  As directed      TED hose    Complete by:  As directed   Use stockings (TED hose) for 3 weeks on both leg(s).  You may remove them at night for sleeping.            Medication List    STOP taking these medications        acetaminophen 500 MG tablet  Commonly known as:  TYLENOL     multivitamin tablet      TAKE these medications        ALPRAZolam 0.25 MG tablet  Commonly known as:  XANAX  Take 0.25 mg by mouth at bedtime as needed. For anxiety.     docusate sodium 100 MG capsule  Commonly known as:  COLACE  Take 1 capsule (100 mg total) by mouth 2  (two) times daily.     estradiol 0.5 MG tablet  Commonly known as:  ESTRACE  Take 1 tablet (0.5 mg total) by mouth daily.     loratadine 10 MG tablet  Commonly known as:  CLARITIN  Take 10 mg by mouth every morning.     methocarbamol 500 MG tablet  Commonly known as:  ROBAXIN  Take 1 tablet (500 mg total) by mouth every 6 (six) hours as needed for muscle spasms.     omeprazole 40 MG capsule  Commonly known as:  PRILOSEC  Take 40 mg by mouth daily.     oxyCODONE 5 MG immediate release tablet  Commonly known as:  Oxy IR/ROXICODONE  Take 1-2 tablets (5-10 mg total) by mouth every 3 (three) hours as needed for breakthrough pain.     rivaroxaban 10 MG Tabs tablet  Commonly known as:  XARELTO  Take 1 tablet (10 mg total) by mouth daily with breakfast.     rosuvastatin 10 MG tablet  Commonly known as:  CRESTOR  Take 5 mg by mouth at bedtime.     SYSTANE OP  Apply 1 drop to eye 3 (three) times daily as needed (dry eyes).     traMADol 50 MG tablet  Commonly known as:  ULTRAM  Take 1-2 tablets (50-100 mg total) by mouth every 6 (six) hours as needed for moderate pain.     triamcinolone cream 0.1 %  Commonly known as:  KENALOG  Apply 1 application topically 2 (two) times daily as needed (patches on skin).     triamterene-hydrochlorothiazide 37.5-25 MG per tablet  Commonly known as:  MAXZIDE-25  Take 1 tablet by mouth daily.           Follow-up Information    Follow up with Gearlean Alf, MD. Schedule an appointment as soon as possible for a visit on 06/06/2014.   Specialty:  Orthopedic Surgery   Why:  Call [phone] tomorrow to make the appointment   Contact information:   [address] [place] [address] [phone]       Signed: Ardeen Jourdain, PA-C Orthopaedic Surgery 05/24/2014, 7:20 AM

## 2014-05-24 NOTE — Progress Notes (Signed)
Discharge summary sent to payer through [employer]  

## 2014-05-24 NOTE — Progress Notes (Signed)
RN called report to [place] at [facility]. All questions answered.   Patient given copy of AVS. Patient/family also given packet of information to give to facility.  Patient medicated before discharge, see MAR.   Patient transported by family members to [facility].

## 2014-05-24 NOTE — Progress Notes (Signed)
Clinical Social Work Department BRIEF PSYCHOSOCIAL ASSESSMENT 05/24/2014  Patient:  Kayla Stokes, Kayla Stokes     Account Number:  [id number]     Admit date:  05/22/2014  Clinical Social Worker:  Lacie Scotts  Date/Time:  05/23/2014 12:30 PM  Referred by:  Physician  Date Referred:  05/23/2014 Referred for  SNF Placement   Other Referral:   Interview type:  Patient Other interview type:    PSYCHOSOCIAL DATA Living Status:  HUSBAND Admitted from facility:   Level of care:   Primary support name:  Herbie Baltimore Primary support relationship to patient:  SPOUSE Degree of support available:   limited    CURRENT CONCERNS Current Concerns  Post-Acute Placement   Other Concerns:    SOCIAL WORK ASSESSMENT / PLAN Pt is a 69 yr old female living at home prior to hospitalization. CSW met with pt to assist with d/c planning. This is a planned admission. Pt has made prior arrangements to have ST Rehab at [facility] following hospital d/c. CSW has contacted SNF and d/c plan has been confirmed. CSW will continue to follow to assist with d/c planning to SNF.   Assessment/plan status:  Psychosocial Support/Ongoing Assessment of Needs Other assessment/ plan:   Information/referral to community resources:   Insurance coverage for SNF and ambulance transport reviewed.    PATIENT'S/FAMILY'S RESPONSE TO PLAN OF CARE: Pt's mood is bright. Her pain is being controlled. " I've been to [place] to tour and complete admission papers. My husband isn't able to care for me at home. I need rehab. " Pt is motivated to being therapy and is looking forward to having rehab at [facility].    Werner Lean LCSW [phone]

## 2014-05-24 NOTE — Progress Notes (Signed)
   Subjective: 2 Days Post-Op Procedure(s) (LRB): LEFT TOTAL KNEE ARTHROPLASTY (Left) Patient reports pain as mild.   Patient seen in rounds with Dr. Wynelle Link. Patient is well, and has had no acute complaints or problems. No issues overnight. No SOB or chest pain.  Plan is to go Skilled nursing facility after hospital stay.  Objective: Vital signs in last 24 hours: Temp:  [98 F (36.7 C)-99.1 F (37.3 C)] 98.3 F (36.8 C) (03/30 0610) Pulse Rate:  [63-70] 68 (03/30 0610) Resp:  [16-18] 16 (03/29 2123) BP: (112-122)/(47-66) 114/59 mmHg (03/30 0610) SpO2:  [95 %-100 %] 95 % (03/30 0610)  Intake/Output from previous day:  Intake/Output Summary (Last 24 hours) at 05/24/14 0718 Last data filed at 05/24/14 4627  Gross per 24 hour  Intake 1757.17 ml  Output   4875 ml  Net -3117.83 ml     Labs:  Recent Labs  05/23/14 0450 05/24/14 0425  HGB 11.5* 10.4*    Recent Labs  05/23/14 0450 05/24/14 0425  WBC 14.3* 14.1*  RBC 3.75* 3.42*  HCT 34.1* 31.7*  PLT 269 246    Recent Labs  05/23/14 0450 05/24/14 0425  NA 133* 137  K 3.2* 3.8  CL 97 103  CO2 28 28  BUN 18 18  CREATININE 1.02 0.91  GLUCOSE 139* 108*  CALCIUM 8.2* 8.1*    EXAM General - Patient is Alert and Oriented Extremity - Neurologically intact Intact pulses distally Dorsiflexion/Plantar flexion intact Compartment soft Dressing/Incision - clean, dry, no drainage Motor Function - intact, moving foot and toes well on exam.   Past Medical History  Diagnosis Date  . High cholesterol   . Arthritis     finger  . Eczema     elbow  . Hypertension     under control, has been on med. x 10 yrs.  . Dental crowns present   . Mass of hand 05/2011    right  . Pancreatitis   . H. pylori infection   . Diverticulosis   . Allergic rhinitis   . Anxiety   . Osteopenia 11/2012    T score -1.1 left forearm FRAX 6.2%/0.1%  . Complication of anesthesia     difficult to awaken after gallbladder surgery  .  GERD (gastroesophageal reflux disease)   . History of hiatal hernia   . Trochanteric bursitis of left hip   . Varicose veins   . Menopause   . Gastritis     hx of   . Measles     hx of   . Mumps     hx of   . Rubella     hx of   . Bursitis of right hip     Assessment/Plan: 2 Days Post-Op Procedure(s) (LRB): LEFT TOTAL KNEE ARTHROPLASTY (Left) Principal Problem:   OA (osteoarthritis) of knee  Estimated body mass index is 28.35 kg/(m^2) as calculated from the following:   Height as of this encounter: 5\' 3"  (1.6 m).   Weight as of this encounter: 72.576 kg (160 lb). Advance diet Up with therapy Discharge to SNF  DVT Prophylaxis - Xarelto Weight-Bearing as tolerated   She is doing great. Will continue PT this morning. Plan for DC to SNF this afternoon.   Ardeen Jourdain, PA-C Orthopaedic Surgery 05/24/2014, 7:18 AM

## 2014-05-25 NOTE — Progress Notes (Signed)
Clinical Social Work Department CLINICAL SOCIAL WORK PLACEMENT NOTE 05/25/2014  Patient:  Kayla Stokes, Kayla Stokes  Account Number:  [id number] Admit date:  05/22/2014  Clinical Social Worker:  Werner Lean, LCSW  Date/time:  05/24/2014 08:17 AM  Clinical Social Work is seeking post-discharge placement for this patient at the following level of care:   SKILLED NURSING   (*CSW will update this form in Epic as items are completed)     Patient/family provided with [facility] Department of Clinical Social Work's list of facilities offering this level of care within the geographic area requested by the patient (or if unable, by the patient's family).  05/24/2014  Patient/family informed of their freedom to choose among providers that offer the needed level of care, that participate in Medicare, Medicaid or managed care program needed by the patient, have an available bed and are willing to accept the patient.    Patient/family informed of [facility]' ownership interest in [facility], as well as of the fact that they are under no obligation to receive care at this facility.  PASARR submitted to EDS on 05/23/2014 PASARR number received on 05/23/2014  FL2 transmitted to all facilities in geographic area requested by pt/family on  05/24/2014 FL2 transmitted to all facilities within larger geographic area on   Patient informed that his/her managed care company has contracts with or will negotiate with  certain facilities, including the following:     Patient/family informed of bed offers received:  05/23/2014 Patient chooses bed at [facility] Physician recommends and patient chooses bed at    Patient to be transferred to [facility] on  05/24/2014 Patient to be transferred to facility by [facility] Patient and family notified of transfer on 05/24/2014 Name of family member notified:  SPOUSE  The following physician request were entered in Epic:   Additional Comments: Pt / spouse are  in agreement with d/c to SNF today. PT approved transport by car. NSG reviewed d/c summary, scripts, avs. Scripts included in d/c packet. D/c packet provided to pt prior to d/c.  Werner Lean LCSW [phone]

## 2014-05-29 ENCOUNTER — Non-Acute Institutional Stay (SKILLED_NURSING_FACILITY): Payer: Medicare Other | Admitting: Adult Health

## 2014-05-29 DIAGNOSIS — F411 Generalized anxiety disorder: Secondary | ICD-10-CM | POA: Diagnosis not present

## 2014-05-29 DIAGNOSIS — I10 Essential (primary) hypertension: Secondary | ICD-10-CM

## 2014-05-29 DIAGNOSIS — E785 Hyperlipidemia, unspecified: Secondary | ICD-10-CM

## 2014-05-29 DIAGNOSIS — K5901 Slow transit constipation: Secondary | ICD-10-CM | POA: Diagnosis not present

## 2014-05-29 DIAGNOSIS — K219 Gastro-esophageal reflux disease without esophagitis: Secondary | ICD-10-CM

## 2014-05-29 DIAGNOSIS — J309 Allergic rhinitis, unspecified: Secondary | ICD-10-CM

## 2014-05-29 DIAGNOSIS — Z79890 Hormone replacement therapy (postmenopausal): Secondary | ICD-10-CM

## 2014-05-29 DIAGNOSIS — M1712 Unilateral primary osteoarthritis, left knee: Secondary | ICD-10-CM

## 2014-06-08 ENCOUNTER — Encounter: Payer: Self-pay | Admitting: Internal Medicine

## 2014-06-11 ENCOUNTER — Encounter (HOSPITAL_COMMUNITY): Payer: Self-pay | Admitting: Emergency Medicine

## 2014-06-11 ENCOUNTER — Emergency Department (HOSPITAL_COMMUNITY)
Admission: EM | Admit: 2014-06-11 | Discharge: 2014-06-11 | Disposition: A | Discharge status: Home / Self Care | Payer: Medicare Other | Attending: Emergency Medicine | Admitting: Emergency Medicine

## 2014-06-11 DIAGNOSIS — Z9851 Tubal ligation status: Secondary | ICD-10-CM | POA: Insufficient documentation

## 2014-06-11 DIAGNOSIS — Z872 Personal history of diseases of the skin and subcutaneous tissue: Secondary | ICD-10-CM | POA: Insufficient documentation

## 2014-06-11 DIAGNOSIS — Z9049 Acquired absence of other specified parts of digestive tract: Secondary | ICD-10-CM | POA: Insufficient documentation

## 2014-06-11 DIAGNOSIS — F419 Anxiety disorder, unspecified: Secondary | ICD-10-CM | POA: Insufficient documentation

## 2014-06-11 DIAGNOSIS — Z8709 Personal history of other diseases of the respiratory system: Secondary | ICD-10-CM | POA: Diagnosis not present

## 2014-06-11 DIAGNOSIS — Z90710 Acquired absence of both cervix and uterus: Secondary | ICD-10-CM | POA: Insufficient documentation

## 2014-06-11 DIAGNOSIS — E780 Pure hypercholesterolemia: Secondary | ICD-10-CM | POA: Insufficient documentation

## 2014-06-11 DIAGNOSIS — E876 Hypokalemia: Secondary | ICD-10-CM | POA: Diagnosis not present

## 2014-06-11 DIAGNOSIS — I10 Essential (primary) hypertension: Secondary | ICD-10-CM | POA: Insufficient documentation

## 2014-06-11 DIAGNOSIS — K219 Gastro-esophageal reflux disease without esophagitis: Secondary | ICD-10-CM | POA: Diagnosis not present

## 2014-06-11 DIAGNOSIS — R112 Nausea with vomiting, unspecified: Secondary | ICD-10-CM | POA: Diagnosis present

## 2014-06-11 DIAGNOSIS — Z8619 Personal history of other infectious and parasitic diseases: Secondary | ICD-10-CM | POA: Insufficient documentation

## 2014-06-11 DIAGNOSIS — Z79899 Other long term (current) drug therapy: Secondary | ICD-10-CM | POA: Insufficient documentation

## 2014-06-11 DIAGNOSIS — M1990 Unspecified osteoarthritis, unspecified site: Secondary | ICD-10-CM | POA: Insufficient documentation

## 2014-06-11 DIAGNOSIS — K529 Noninfective gastroenteritis and colitis, unspecified: Secondary | ICD-10-CM | POA: Diagnosis not present

## 2014-06-11 LAB — CBC WITH DIFFERENTIAL/PLATELET
Basophils Absolute: 0 10*3/uL (ref 0.0–0.1)
Basophils Relative: 0 % (ref 0–1)
EOS PCT: 2 % (ref 0–5)
Eosinophils Absolute: 0.2 10*3/uL (ref 0.0–0.7)
HCT: 38 % (ref 36.0–46.0)
HEMOGLOBIN: 12.7 g/dL (ref 12.0–15.0)
Lymphocytes Relative: 27 % (ref 12–46)
Lymphs Abs: 2.2 10*3/uL (ref 0.7–4.0)
MCH: 30.2 pg (ref 26.0–34.0)
MCHC: 33.4 g/dL (ref 30.0–36.0)
MCV: 90.5 fL (ref 78.0–100.0)
MONO ABS: 0.9 10*3/uL (ref 0.1–1.0)
Monocytes Relative: 11 % (ref 3–12)
Neutro Abs: 4.8 10*3/uL (ref 1.7–7.7)
Neutrophils Relative %: 60 % (ref 43–77)
Platelets: 522 10*3/uL — ABNORMAL HIGH (ref 150–400)
RBC: 4.2 MIL/uL (ref 3.87–5.11)
RDW: 14.4 % (ref 11.5–15.5)
WBC: 8 10*3/uL (ref 4.0–10.5)

## 2014-06-11 LAB — URINALYSIS, ROUTINE W REFLEX MICROSCOPIC
Bilirubin Urine: NEGATIVE
Glucose, UA: NEGATIVE mg/dL
Hgb urine dipstick: NEGATIVE
Ketones, ur: NEGATIVE mg/dL
LEUKOCYTES UA: NEGATIVE
Nitrite: NEGATIVE
PH: 7.5 (ref 5.0–8.0)
PROTEIN: NEGATIVE mg/dL
SPECIFIC GRAVITY, URINE: 1.01 (ref 1.005–1.030)
Urobilinogen, UA: 0.2 mg/dL (ref 0.0–1.0)

## 2014-06-11 LAB — COMPREHENSIVE METABOLIC PANEL
ALT: 22 U/L (ref 0–35)
AST: 30 U/L (ref 0–37)
Albumin: 4.5 g/dL (ref 3.5–5.2)
Alkaline Phosphatase: 97 U/L (ref 39–117)
Anion gap: 11 (ref 5–15)
BUN: 9 mg/dL (ref 6–23)
CHLORIDE: 93 mmol/L — AB (ref 96–112)
CO2: 31 mmol/L (ref 19–32)
CREATININE: 0.94 mg/dL (ref 0.50–1.10)
Calcium: 9.3 mg/dL (ref 8.4–10.5)
GFR, EST AFRICAN AMERICAN: 72 mL/min — AB (ref >90)
GFR, EST NON AFRICAN AMERICAN: 62 mL/min — AB (ref >90)
Glucose, Bld: 108 mg/dL — ABNORMAL HIGH (ref 70–99)
POTASSIUM: 2.5 mmol/L — AB (ref 3.5–5.1)
Sodium: 135 mmol/L (ref 135–145)
Total Bilirubin: 0.9 mg/dL (ref 0.3–1.2)
Total Protein: 8.1 g/dL (ref 6.0–8.3)

## 2014-06-11 LAB — I-STAT TROPONIN, ED: Troponin i, poc: 0 ng/mL (ref 0.00–0.08)

## 2014-06-11 LAB — LIPASE, BLOOD: LIPASE: 29 U/L (ref 11–59)

## 2014-06-11 MED ORDER — SODIUM CHLORIDE 0.9 % IV BOLUS (SEPSIS)
1000.0000 mL | Freq: Once | INTRAVENOUS | Status: AC
  Administered 2014-06-11: 1000 mL via INTRAVENOUS

## 2014-06-11 MED ORDER — POTASSIUM CHLORIDE 10 MEQ/100ML IV SOLN
10.0000 meq | Freq: Once | INTRAVENOUS | Status: AC
  Administered 2014-06-11: 10 meq via INTRAVENOUS
  Filled 2014-06-11: qty 100

## 2014-06-11 MED ORDER — ONDANSETRON 8 MG PO TBDP: ORAL_TABLET | ORAL | Status: DC

## 2014-06-11 MED ORDER — POTASSIUM CHLORIDE CRYS ER 20 MEQ PO TBCR
40.0000 meq | EXTENDED_RELEASE_TABLET | Freq: Once | ORAL | Status: AC
  Administered 2014-06-11: 40 meq via ORAL
  Filled 2014-06-11: qty 2

## 2014-06-11 MED ORDER — ONDANSETRON HCL 4 MG/2ML IJ SOLN
4.0000 mg | Freq: Once | INTRAMUSCULAR | Status: AC
  Administered 2014-06-11: 4 mg via INTRAVENOUS
  Filled 2014-06-11: qty 2

## 2014-06-11 MED ORDER — POTASSIUM CHLORIDE ER 10 MEQ PO TBCR: 20.0000 meq | EXTENDED_RELEASE_TABLET | Freq: Two times a day (BID) | ORAL | Status: DC

## 2014-06-11 NOTE — ED Notes (Addendum)
Pt c/o generalized abdominal pain, N/V/D x 3 days with low grade fever off and on. Pt has had episodes similar to this in the past when dx with pancreatitis. Pt sts the only difference this time is she is not distended. Pt A&Ox4 and tearful in triage. Pt ambulatory with cane - pt had surgery on her knee with no complications on March 70RA.

## 2014-06-11 NOTE — ED Provider Notes (Signed)
CSN: [id number]     Arrival date & time 06/11/14  1339 History   First MD Initiated Contact with Patient 06/11/14 1458     Chief Complaint  Patient presents with  . Abdominal Pain  . Nausea  . Emesis     (Consider location/radiation/quality/duration/timing/severity/associated sxs/prior Treatment) HPI Comments: Patient is a 69 year old female with past medical history of cholecystectomy, hypertension, and recent total knee replacement. She presents for evaluation of nausea, vomiting, and loose stools that started 2 and a half days prior to arrival. She reports an inability to tolerate solids or liquids. She denies bloody stool, but does state that one episode of emesis was blood tinged.  Patient is a 69 y.o. female presenting with abdominal pain. The history is provided by the patient.  Abdominal Pain Pain location:  Generalized Pain quality: cramping   Pain radiates to:  Does not radiate Pain severity:  Moderate Onset quality:  Sudden Duration:  3 days Timing:  Intermittent Progression:  Worsening Chronicity:  New Relieved by:  Nothing Worsened by:  Nothing tried Ineffective treatments:  None tried Associated symptoms: diarrhea and vomiting   Associated symptoms: no chills and no fever     Past Medical History  Diagnosis Date  . High cholesterol   . Arthritis     finger  . Eczema     elbow  . Hypertension     under control, has been on med. x 10 yrs.  . Dental crowns present   . Mass of hand 05/2011    right  . Pancreatitis   . H. pylori infection   . Diverticulosis   . Allergic rhinitis   . Anxiety   . Osteopenia 11/2012    T score -1.1 left forearm FRAX 6.2%/0.1%  . Complication of anesthesia     difficult to awaken after gallbladder surgery  . GERD (gastroesophageal reflux disease)   . History of hiatal hernia   . Trochanteric bursitis of left hip   . Varicose veins   . Menopause   . Gastritis     hx of   . Measles     hx of   . Mumps     hx of   .  Rubella     hx of   . Bursitis of right hip    Past Surgical History  Procedure Laterality Date  . Varicose vein surgery  02/13/2003    ligation and stripping left greater saphenous; exc.  multiple varicosities  . Vaginal hysterectomy with a&p repair  1989  . Cataract extraction w/ intraocular lens  implant, bilateral  09/2010, 01/2011    bilateral  . Knee arthroscopy  02/12/2011    Procedure: ARTHROSCOPY KNEE;  Surgeon: Gearlean Alf;  Location: [facility];  Service: Orthopedics;  Laterality: Left;  . Meniscus debridement  02/12/2011    Procedure: DEBRIDEMENT OF MENISCUS;  Surgeon: Gearlean Alf;  Location: [facility];  Service: Orthopedics;  Laterality: Left;  . Chondroplasty  02/12/2011    Procedure: CHONDROPLASTY;  Surgeon: Gearlean Alf;  Location: [facility];  Service: Orthopedics;  Laterality: Left;  Marland Kitchen Mass excision  06/05/2011    Procedure: EXCISION MASS;  Surgeon: Linna Hoff, MD;  Location: [facility];  Service: Orthopedics;  Laterality: Right;  right thumb  . Eye surgery      laser right eye; due to right eye hemorrhage; had left eye retinal tear with subsequent surgery  . Mouth surgery  implants   . Tubal ligation    . Ablation saphenous vein w/ rfa  10-2011  . Cholecystectomy  MAY 2014  . Stent placement after cholecestectomy    . Abdominal hysterectomy    . Total knee arthroplasty Left 05/22/2014    Procedure: LEFT TOTAL KNEE ARTHROPLASTY;  Surgeon: Gaynelle Arabian, MD;  Location: [facility];  Service: Orthopedics;  Laterality: Left;   Family History  Problem Relation Age of Onset  . Diabetes Mother   . Hypertension Mother   . Heart disease Mother   . Hypertension Father   . Cervical cancer Sister   . Heart disease Brother   . Lung cancer Brother   . Throat cancer Brother   . Colon cancer Mother 52  . Colon polyps Neg Hx    History  Substance Use Topics  . Smoking status: Never Smoker   .  Smokeless tobacco: Never Used  . Alcohol Use: 0.0 oz/week     Comment: occasionally-Rare   OB History    Gravida Para Term Preterm AB TAB SAB Ectopic Multiple Living   3 3 3       3      Review of Systems  Constitutional: Negative for fever and chills.  Gastrointestinal: Positive for vomiting, abdominal pain and diarrhea.  All other systems reviewed and are negative.     Allergies  Robaxin and Nitrofurantoin monohyd macro  Home Medications   Prior to Admission medications   Medication Sig Start Date End Date Taking? Authorizing Provider  ALPRAZolam (XANAX) 0.25 MG tablet Take 0.25 mg by mouth 3 (three) times daily as needed for anxiety. For anxiety.   Yes Historical Provider, MD  docusate sodium (COLACE) 100 MG capsule Take 1 capsule (100 mg total) by mouth 2 (two) times daily. 05/23/14  Yes Amber Cecilio Asper, PA-C  estradiol (ESTRACE) 0.5 MG tablet Take 1 tablet (0.5 mg total) by mouth daily. Patient taking differently: Take 0.5 mg by mouth once a week. Wed. 12/05/13  Yes Huel Cote, NP  loratadine (CLARITIN) 10 MG tablet Take 10 mg by mouth every morning.   Yes Historical Provider, MD  Multiple Vitamin (MULTIVITAMIN WITH MINERALS) TABS tablet Take 1 tablet by mouth daily.   Yes Historical Provider, MD  omeprazole (PRILOSEC) 40 MG capsule Take 40 mg by mouth daily.  03/07/14  Yes Historical Provider, MD  Polyethyl Glycol-Propyl Glycol (SYSTANE OP) Apply 1 drop to eye 3 (three) times daily as needed (dry eyes).   Yes Historical Provider, MD  rosuvastatin (CRESTOR) 10 MG tablet Take 5 mg by mouth at bedtime.    Yes Historical Provider, MD  traMADol (ULTRAM) 50 MG tablet Take 1-2 tablets (50-100 mg total) by mouth every 6 (six) hours as needed for moderate pain. 05/23/14  Yes Amber Cecilio Asper, PA-C  triamcinolone cream (KENALOG) 0.1 % Apply 1 application topically 2 (two) times daily as needed (patches on skin).   Yes Historical Provider, MD  triamterene-hydrochlorothiazide (MAXZIDE-25)  37.5-25 MG per tablet Take 1 tablet by mouth daily. 03/01/14  Yes Historical Provider, MD  methocarbamol (ROBAXIN) 500 MG tablet Take 1 tablet (500 mg total) by mouth every 6 (six) hours as needed for muscle spasms. Patient not taking: Reported on 06/11/2014 05/23/14   Ardeen Jourdain, PA-C  oxyCODONE (OXY IR/ROXICODONE) 5 MG immediate release tablet Take 1-2 tablets (5-10 mg total) by mouth every 3 (three) hours as needed for breakthrough pain. Patient not taking: Reported on 06/11/2014 05/23/14   Ardeen Jourdain, PA-C  rivaroxaban (XARELTO) 10 MG  TABS tablet Take 1 tablet (10 mg total) by mouth daily with breakfast. Patient not taking: Reported on 06/11/2014 05/23/14   Amber Constable, PA-C   BP 138/81 mmHg  Pulse 100  Temp(Src) 98.2 F (36.8 C) (Oral)  Resp 16  SpO2 100% Physical Exam  Constitutional: She is oriented to person, place, and time. She appears well-developed and well-nourished. No distress.  HENT:  Head: Normocephalic and atraumatic.  Neck: Normal range of motion. Neck supple.  Cardiovascular: Normal rate and regular rhythm.  Exam reveals no gallop and no friction rub.   No murmur heard. Pulmonary/Chest: Effort normal and breath sounds normal. No respiratory distress. She has no wheezes.  Abdominal: Soft. Bowel sounds are normal. She exhibits no distension. There is no tenderness.  Musculoskeletal: Normal range of motion. She exhibits no edema.  Neurological: She is alert and oriented to person, place, and time.  Skin: Skin is warm and dry. She is not diaphoretic.  Nursing note and vitals reviewed.   ED Course  Procedures (including critical care time) Labs Review Labs Reviewed  CBC WITH DIFFERENTIAL/PLATELET - Abnormal; Notable for the following:    Platelets 522 (*)    All other components within normal limits  COMPREHENSIVE METABOLIC PANEL - Abnormal; Notable for the following:    Potassium 2.5 (*)    Chloride 93 (*)    Glucose, Bld 108 (*)    GFR calc non Af Amer  62 (*)    GFR calc Af Amer 72 (*)    All other components within normal limits  LIPASE, BLOOD  URINALYSIS, ROUTINE W REFLEX MICROSCOPIC    Imaging Review No results found.   EKG Interpretation   Date/Time:  Sunday June 11 2014 17:36:05 EDT Ventricular Rate:  84 PR Interval:  171 QRS Duration: 89 QT Interval:  410 QTC Calculation: 485 R Axis:   46 Text Interpretation:  Sinus rhythm Low voltage, precordial leads Baseline  wander in lead(s) V3 Confirmed by DELOS  MD, Geovanni Rahming ([id number]) on 06/11/2014  6:06:56 PM      MDM   Final diagnoses:  None    Patient presents with complaints of nausea, vomiting, and diarrhea for the past several days. Her presentation, exam, and workup are consistent with a viral gastroenteritis. She does have a low potassium of 2.5. She was given IV and oral replacement here in the ER and tolerated these well. She was also given fluids and anti-medics and is now feeling better. Due to the low potassium, I had planned to admit for IV replacement. I spoke with Dr. Charlies Silvers from the hospitalist service who felt that the patient did not require admission and that oral replacement would be appropriate given that the patient was not actively vomiting.  She also complained of discomfort in her upper back that has been present for several days. It appears musculoskeletal in nature, possibly from vomiting, and I highly doubt a cardiac etiology. An EKG and troponin were obtained which were both unremarkable. She will be discharged to home and understands to return if her symptoms significantly worsen or change.    Veryl Speak, MD 06/11/14 [phone]

## 2014-06-11 NOTE — Discharge Instructions (Signed)
Potassium as prescribed. Zofran as prescribed as needed for nausea.  Clear liquid diet for the next 12 hours, then slowly advance to normal.  Follow-up with your primary Dr. if not improving, and return to the ER if your symptoms significantly worsen or change.   Hypokalemia Hypokalemia means that the amount of potassium in the blood is lower than normal.Potassium is a chemical, called an electrolyte, that helps regulate the amount of fluid in the body. It also stimulates muscle contraction and helps nerves function properly.Most of the body's potassium is inside of cells, and only a very small amount is in the blood. Because the amount in the blood is so small, minor changes can be life-threatening. CAUSES  Antibiotics.  Diarrhea or vomiting.  Using laxatives too much, which can cause diarrhea.  Chronic kidney disease.  Water pills (diuretics).  Eating disorders (bulimia).  Low magnesium level.  Sweating a lot. SIGNS AND SYMPTOMS  Weakness.  Constipation.  Fatigue.  Muscle cramps.  Mental confusion.  Skipped heartbeats or irregular heartbeat (palpitations).  Tingling or numbness. DIAGNOSIS  Your health care provider can diagnose hypokalemia with blood tests. In addition to checking your potassium level, your health care provider may also check other lab tests. TREATMENT Hypokalemia can be treated with potassium supplements taken by mouth or adjustments in your current medicines. If your potassium level is very low, you may need to get potassium through a vein (IV) and be monitored in the hospital. A diet high in potassium is also helpful. Foods high in potassium are:  Nuts, such as peanuts and pistachios.  Seeds, such as sunflower seeds and pumpkin seeds.  Peas, lentils, and lima beans.  Whole grain and bran cereals and breads.  Fresh fruit and vegetables, such as apricots, avocado, bananas, cantaloupe, kiwi, oranges, tomatoes, asparagus, and  potatoes.  Orange and tomato juices.  Red meats.  Fruit yogurt. HOME CARE INSTRUCTIONS  Take all medicines as prescribed by your health care provider.  Maintain a healthy diet by including nutritious food, such as fruits, vegetables, nuts, whole grains, and lean meats.  If you are taking a laxative, be sure to follow the directions on the label. SEEK MEDICAL CARE IF:  Your weakness gets worse.  You feel your heart pounding or racing.  You are vomiting or having diarrhea.  You are diabetic and having trouble keeping your blood glucose in the normal range. SEEK IMMEDIATE MEDICAL CARE IF:  You have chest pain, shortness of breath, or dizziness.  You are vomiting or having diarrhea for more than 2 days.  You faint. MAKE SURE YOU:   Understand these instructions.  Will watch your condition.  Will get help right away if you are not doing well or get worse. Document Released: 02/10/2005 Document Revised: 12/01/2012 Document Reviewed: 08/13/2012 [facility] Patient Information 2015 [place], [place]. This information is not intended to replace advice given to you by your health care provider. Make sure you discuss any questions you have with your health care provider.  Viral Gastroenteritis Viral gastroenteritis is also known as stomach flu. This condition affects the stomach and intestinal tract. It can cause sudden diarrhea and vomiting. The illness typically lasts 3 to 8 days. Most people develop an immune response that eventually gets rid of the virus. While this natural response develops, the virus can make you quite ill. CAUSES  Many different viruses can cause gastroenteritis, such as rotavirus or noroviruses. You can catch one of these viruses by consuming contaminated food or water. You may  also catch a virus by sharing utensils or other personal items with an infected person or by touching a contaminated surface. SYMPTOMS  The most common symptoms are diarrhea and vomiting.  These problems can cause a severe loss of body fluids (dehydration) and a body salt (electrolyte) imbalance. Other symptoms may include:  Fever.  Headache.  Fatigue.  Abdominal pain. DIAGNOSIS  Your caregiver can usually diagnose viral gastroenteritis based on your symptoms and a physical exam. A stool sample may also be taken to test for the presence of viruses or other infections. TREATMENT  This illness typically goes away on its own. Treatments are aimed at rehydration. The most serious cases of viral gastroenteritis involve vomiting so severely that you are not able to keep fluids down. In these cases, fluids must be given through an intravenous line (IV). HOME CARE INSTRUCTIONS   Drink enough fluids to keep your urine clear or pale yellow. Drink small amounts of fluids frequently and increase the amounts as tolerated.  Ask your caregiver for specific rehydration instructions.  Avoid:  Foods high in sugar.  Alcohol.  Carbonated drinks.  Tobacco.  Juice.  Caffeine drinks.  Extremely hot or cold fluids.  Fatty, greasy foods.  Too much intake of anything at one time.  Dairy products until 24 to 48 hours after diarrhea stops.  You may consume probiotics. Probiotics are active cultures of beneficial bacteria. They may lessen the amount and number of diarrheal stools in adults. Probiotics can be found in yogurt with active cultures and in supplements.  Wash your hands well to avoid spreading the virus.  Only take over-the-counter or prescription medicines for pain, discomfort, or fever as directed by your caregiver. Do not give aspirin to children. Antidiarrheal medicines are not recommended.  Ask your caregiver if you should continue to take your regular prescribed and over-the-counter medicines.  Keep all follow-up appointments as directed by your caregiver. SEEK IMMEDIATE MEDICAL CARE IF:   You are unable to keep fluids down.  You do not urinate at least once  every 6 to 8 hours.  You develop shortness of breath.  You notice blood in your stool or vomit. This may look like coffee grounds.  You have abdominal pain that increases or is concentrated in one small area (localized).  You have persistent vomiting or diarrhea.  You have a fever.  The patient is a child younger than 3 months, and he or she has a fever.  The patient is a child older than 3 months, and he or she has a fever and persistent symptoms.  The patient is a child older than 3 months, and he or she has a fever and symptoms suddenly get worse.  The patient is a baby, and he or she has no tears when crying. MAKE SURE YOU:   Understand these instructions.  Will watch your condition.  Will get help right away if you are not doing well or get worse. Document Released: 02/10/2005 Document Revised: 05/05/2011 Document Reviewed: 11/27/2010 [facility] Patient Information 2015 [place], [place]. This information is not intended to replace advice given to you by your health care provider. Make sure you discuss any questions you have with your health care provider.

## 2014-06-11 NOTE — ED Notes (Signed)
I have just notified her primary nurse, Barnett Applebaum of pt. K+ of 2.5.

## 2014-06-14 ENCOUNTER — Encounter: Payer: Self-pay | Admitting: Adult Health

## 2014-06-14 NOTE — Progress Notes (Signed)
Patient ID: Kayla Stokes, female   DOB: 09-24-1945, 69 y.o.   MRN: [id number]   05/29/14  Facility:  [facility]urs[facility] Home Location:  [place] Room Number: 8366-Q LEVEL OF CARE:  SNF (31)   Chief Complaint  Patient presents with  . Discharge Note    Osteoarthritis S/P left total knee arthroplasty, HRT, allergic rhinitis, GERD, hyperlipidemia, hypertension, constipation and anxiety    HISTORY OF PRESENT ILLNESS:  This is a 69 year old female who is for discharge home and will have outpatient rehabilitation. She has been admitted to [facility] on 05/24/14 from [facility] with osteoarthritis S/P left total knee arthroplasty. She has PMH of allergic rhinitis, GERD, hyperlipidemia, hypertension and anxiety.   Patient was admitted to this facility for short-term rehabilitation after the patient's recent hospitalization.  Patient has completed SNF rehabilitation and therapy has cleared the patient for discharge.   PAST MEDICAL HISTORY:  Past Medical History  Diagnosis Date  . High cholesterol   . Arthritis     finger  . Eczema     elbow  . Hypertension     under control, has been on med. x 10 yrs.  . Dental crowns present   . Mass of hand 05/2011    right  . Pancreatitis   . H. pylori infection   . Diverticulosis   . Allergic rhinitis   . Anxiety   . Osteopenia 11/2012    T score -1.1 left forearm FRAX 6.2%/0.1%  . Complication of anesthesia     difficult to awaken after gallbladder surgery  . GERD (gastroesophageal reflux disease)   . History of hiatal hernia   . Trochanteric bursitis of left hip   . Varicose veins   . Menopause   . Gastritis     hx of   . Measles     hx of   . Mumps     hx of   . Rubella     hx of   . Bursitis of right hip     CURRENT MEDICATIONS: Reviewed per MAR/see medication list  Allergies  Allergen Reactions  . Robaxin [Methocarbamol] Nausea And Vomiting    Dull headache.   . Nitrofurantoin Monohyd  Macro Other (See Comments)    SEVERE STOMACH PAIN     REVIEW OF SYSTEMS:  GENERAL: no change in appetite, no fatigue, no weight changes, no fever, chills or weakness RESPIRATORY: no cough, SOB, DOE, wheezing, hemoptysis CARDIAC: no chest pain, edema or palpitations GI: no abdominal pain, diarrhea, constipation, heart burn, nausea or vomiting  PHYSICAL EXAMINATION  GENERAL: no acute distress, normal body habitus SKIN:  Left knee surgical incision is dry, has steri-strips, dry EYES: conjunctivae normal, sclerae normal, normal eye lids NECK: supple, trachea midline, no neck masses, no thyroid tenderness, no thyromegaly LYMPHATICS: no LAN in the neck, no supraclavicular LAN RESPIRATORY: breathing is even & unlabored, BS CTAB CARDIAC: RRR, no murmur,no extra heart sounds, no edema GI: abdomen soft, normal BS, no masses, no tenderness, no hepatomegaly, no splenomegaly EXTREMITIES: Able to move 4 extremities PSYCHIATRIC: the patient is alert & oriented to person, affect & behavior appropriate  LABS/RADIOLOGY: Labs reviewed: 05/26/14  WBC 9.4 hemoglobin 10.7 hematocrit 31.8 MCV 91.1 sodium 139 potassium 4.0 glucose 95 BUN 13 creatinine 1.01 total bilirubin 1.1 alkaline phosphatase 95 SGOT 75 SGPT 66 total protein 5.6 albumin Green 3.4 calcium 8.4 Basic Metabolic Panel:  Recent Labs  05/23/14 0450 05/24/14 0425 06/11/14 1401  NA 133*  137 135  K 3.2* 3.8 2.5*  CL 97 103 93*  CO2 28 28 31   GLUCOSE 139* 108* 108*  BUN 18 18 9   CREATININE 1.02 0.91 0.94  CALCIUM 8.2* 8.1* 9.3   Liver Function Tests:  Recent Labs  05/08/14 0950 06/11/14 1401  AST 40* 30  ALT 44* 22  ALKPHOS 76 97  BILITOT 1.0 0.9  PROT 7.7 8.1  ALBUMIN 4.5 4.5    Recent Labs  06/11/14 1401  LIPASE 29   CBC:  Recent Labs  05/23/14 0450 05/24/14 0425 06/11/14 1401  WBC 14.3* 14.1* 8.0  NEUTROABS  --   --  4.8  HGB 11.5* 10.4* 12.7  HCT 34.1* 31.7* 38.0  MCV 90.9 92.7 90.5  PLT 269 246 522*       ASSESSMENT/PLAN:  Osteoarthritis S/P left total knee arthroplasty - for outpatient rehabilitation; continue Xarelto 10 mg 1 tab by mouth every morning for DVT prophylaxis; continue Robaxin 500 mg 1 tab by mouth every 6 hours when necessary for muscle spasm; and Ultram 50 mg 1-2 tabs by mouth every 6 hours when necessary and Tylenol 1 g by mouth every 6 hours when necessary for pain. HRT - continue Estrace 0.5 mg 1 tab by mouth daily Allergic rhinitis - continue Claritin 10 mg by mouth daily GERD - stable; continue Prilosec 40 mg by mouth daily Hyperlipidemia - continue Lipitor 20 mg 1 tab by mouth daily Hypertension - well controlled; continue triamterene HCTZ 37.5-25 mg by mouth daily Constipation -continue Colace 100 mg 1 tab by mouth every twice a day Anxiety - mood is stable; continue Xanax 0.25 mg 1 tab by mouth daily at bedtime    I have filled out patient's discharge paperwork and written prescriptions.  Patient will have outpatient rehabilitation.  Total discharge time: Less than 30 minutes  Discharge time involved coordination of the discharge process with Education officer, museum, nursing staff and therapy department.    [facility], NP [employer] [phone]

## 2014-10-27 ENCOUNTER — Encounter: Payer: Self-pay | Admitting: Women's Health

## 2014-12-05 ENCOUNTER — Encounter: Payer: Self-pay | Admitting: Women's Health

## 2014-12-05 ENCOUNTER — Telehealth: Payer: Self-pay

## 2014-12-05 ENCOUNTER — Ambulatory Visit (INDEPENDENT_AMBULATORY_CARE_PROVIDER_SITE_OTHER): Payer: Medicare Other | Admitting: Women's Health

## 2014-12-05 VITALS — BP 118/80 | Ht 63.0 in | Wt 164.0 lb

## 2014-12-05 DIAGNOSIS — Z79890 Hormone replacement therapy (postmenopausal): Secondary | ICD-10-CM

## 2014-12-05 DIAGNOSIS — Z01419 Encounter for gynecological examination (general) (routine) without abnormal findings: Secondary | ICD-10-CM

## 2014-12-05 MED ORDER — ESTRADIOL 0.5 MG PO TABS: ORAL_TABLET | ORAL | Status: DC

## 2014-12-05 MED ORDER — ESTRADIOL 0.5 MG PO TABS: 0.5000 mg | ORAL_TABLET | Freq: Every day | ORAL | Status: DC

## 2014-12-05 NOTE — Patient Instructions (Signed)

## 2014-12-05 NOTE — Progress Notes (Signed)
Kayla Stokes [birth year] [id number]    History:    Presents for Breast and pelvic exam. 1989 TVH with A&P repair on estradiol 0.5 half tablet 3 times weekly for persistent hot flashes. Normal Pap and mammogram history. DEXA 2014 forearm -1.4 hip average 0.4 FRAX 6.2%/0.1%. 2016 left knee replacement. Pancreatitis , cholecystectomy 2013. Hypertension and hypercholesterolemia managed by primary care. Negative colonoscopy 2015. Current on immunizations  Past medical history, past surgical history, family history and social history were all reviewed and documented in the EPIC chart. Retired Pharmacist, hospital. Parents hypertension, mother diabetes. Currently working with a trainer to increase strength.  ROS:  A ROS was performed and pertinent positives and negatives are included.  Exam:  Filed Vitals:   12/05/14 1357  BP: 118/80    General appearance:  Normal Thyroid:  Symmetrical, normal in size, without palpable masses or nodularity. Respiratory  Auscultation:  Clear without wheezing or rhonchi Cardiovascular  Auscultation:  Regular rate, without rubs, murmurs or gallops  Edema/varicosities:  Not grossly evident Abdominal  Soft,nontender, without masses, guarding or rebound.  Liver/spleen:  No organomegaly noted  Hernia:  None appreciated  Skin  Inspection:  Grossly normal   Breasts: Examined lying and sitting.     Right: Without masses, retractions, discharge or axillary adenopathy.     Left: Without masses, retractions, discharge or axillary adenopathy. Gentitourinary   Inguinal/mons:  Normal without inguinal adenopathy  External genitalia:  Normal  BUS/Urethra/Skene's glands:  Normal  Vagina:   +1 cystocele  Cervix: and uterus absent  Adnexa/parametria:     Rt: Without masses or tenderness.   Lt: Without masses or tenderness.  Anus and perineum: Normal  Digital rectal exam: Normal sphincter tone without palpated masses or tenderness  Assessment/Plan:  69 y.o. M WF G3 P3 for breast  and pelvic exam.   1989 TVH with A&P repair on  HRT  +1 cystocele-asymptomatic  Hypertension/ hypercholesterolemia-primary care manages labs and meds  Plan: HRT reviewed risks of blood clots, strokes and breast cancer, will continue estradiol 0.5 half tablet 2-3 times weekly. States numerous hot flashes when off,  Will continue tapering dose. SBE's, continue annual 3-D mammogram history of dense tissue. Home safety, fall prevention and importance of weightbearing exercise reviewed , continue working with a trainer.      Huel Cote [facility], 4:17 PM 12/05/2014

## 2014-12-05 NOTE — Telephone Encounter (Signed)
You sent Rx for Estradiol tablet. Directions read "Take one tablet daily. Take 1/2 tablet daily".  They needed clarification to fill it but your dictation was not in the chart yet that is why I am asking.

## 2014-12-05 NOTE — Telephone Encounter (Signed)
Rx corrected and new Rx sent as well as voice mail left for pharmacy.

## 2014-12-05 NOTE — Telephone Encounter (Signed)
It is 1/2 tablet daily

## 2014-12-06 LAB — URINALYSIS W MICROSCOPIC + REFLEX CULTURE
Bacteria, UA: NONE SEEN [HPF]
Bilirubin Urine: NEGATIVE
CASTS: NONE SEEN [LPF]
Crystals: NONE SEEN [HPF]
Glucose, UA: NEGATIVE
Hgb urine dipstick: NEGATIVE
KETONES UR: NEGATIVE
Leukocytes, UA: NEGATIVE
NITRITE: NEGATIVE
Protein, ur: NEGATIVE
RBC / HPF: NONE SEEN RBC/HPF (ref <=2)
SPECIFIC GRAVITY, URINE: 1.014 (ref 1.001–1.035)
WBC, UA: NONE SEEN WBC/HPF (ref <=5)
YEAST: NONE SEEN [HPF]
pH: 7 (ref 5.0–8.0)

## 2015-05-19 ENCOUNTER — Emergency Department (HOSPITAL_COMMUNITY): Payer: Medicare Other

## 2015-05-19 ENCOUNTER — Emergency Department (HOSPITAL_COMMUNITY)
Admission: EM | Admit: 2015-05-19 | Discharge: 2015-05-19 | Disposition: A | Discharge status: Home / Self Care | Payer: Medicare Other | Attending: Emergency Medicine | Admitting: Emergency Medicine

## 2015-05-19 ENCOUNTER — Encounter (HOSPITAL_COMMUNITY): Payer: Self-pay

## 2015-05-19 DIAGNOSIS — M1990 Unspecified osteoarthritis, unspecified site: Secondary | ICD-10-CM | POA: Diagnosis not present

## 2015-05-19 DIAGNOSIS — N12 Tubulo-interstitial nephritis, not specified as acute or chronic: Secondary | ICD-10-CM

## 2015-05-19 DIAGNOSIS — Z8619 Personal history of other infectious and parasitic diseases: Secondary | ICD-10-CM | POA: Diagnosis not present

## 2015-05-19 DIAGNOSIS — K219 Gastro-esophageal reflux disease without esophagitis: Secondary | ICD-10-CM | POA: Insufficient documentation

## 2015-05-19 DIAGNOSIS — Z79899 Other long term (current) drug therapy: Secondary | ICD-10-CM | POA: Insufficient documentation

## 2015-05-19 DIAGNOSIS — Z793 Long term (current) use of hormonal contraceptives: Secondary | ICD-10-CM | POA: Diagnosis not present

## 2015-05-19 DIAGNOSIS — Z872 Personal history of diseases of the skin and subcutaneous tissue: Secondary | ICD-10-CM | POA: Diagnosis not present

## 2015-05-19 DIAGNOSIS — I10 Essential (primary) hypertension: Secondary | ICD-10-CM | POA: Diagnosis not present

## 2015-05-19 DIAGNOSIS — R109 Unspecified abdominal pain: Secondary | ICD-10-CM | POA: Diagnosis present

## 2015-05-19 LAB — URINALYSIS, ROUTINE W REFLEX MICROSCOPIC
BILIRUBIN URINE: NEGATIVE
Glucose, UA: NEGATIVE mg/dL
Hgb urine dipstick: NEGATIVE
KETONES UR: NEGATIVE mg/dL
NITRITE: POSITIVE — AB
PH: 7 (ref 5.0–8.0)
Protein, ur: NEGATIVE mg/dL
SPECIFIC GRAVITY, URINE: 1.023 (ref 1.005–1.030)

## 2015-05-19 LAB — CBC
HEMATOCRIT: 39.9 % (ref 36.0–46.0)
HEMOGLOBIN: 14.1 g/dL (ref 12.0–15.0)
MCH: 30.5 pg (ref 26.0–34.0)
MCHC: 35.3 g/dL (ref 30.0–36.0)
MCV: 86.4 fL (ref 78.0–100.0)
Platelets: 268 10*3/uL (ref 150–400)
RBC: 4.62 MIL/uL (ref 3.87–5.11)
RDW: 14.4 % (ref 11.5–15.5)
WBC: 13.7 10*3/uL — AB (ref 4.0–10.5)

## 2015-05-19 LAB — URINE MICROSCOPIC-ADD ON: RBC / HPF: NONE SEEN RBC/hpf (ref 0–5)

## 2015-05-19 LAB — COMPREHENSIVE METABOLIC PANEL
ALK PHOS: 77 U/L (ref 38–126)
ALT: 37 U/L (ref 14–54)
AST: 42 U/L — AB (ref 15–41)
Albumin: 4.5 g/dL (ref 3.5–5.0)
Anion gap: 13 (ref 5–15)
BILIRUBIN TOTAL: 1.5 mg/dL — AB (ref 0.3–1.2)
BUN: 21 mg/dL — AB (ref 6–20)
CHLORIDE: 99 mmol/L — AB (ref 101–111)
CO2: 26 mmol/L (ref 22–32)
Calcium: 9.5 mg/dL (ref 8.9–10.3)
Creatinine, Ser: 1.26 mg/dL — ABNORMAL HIGH (ref 0.44–1.00)
GFR calc Af Amer: 50 mL/min — ABNORMAL LOW (ref >60)
GFR, EST NON AFRICAN AMERICAN: 43 mL/min — AB (ref >60)
GLUCOSE: 110 mg/dL — AB (ref 65–99)
POTASSIUM: 3.2 mmol/L — AB (ref 3.5–5.1)
Sodium: 138 mmol/L (ref 135–145)
Total Protein: 7.7 g/dL (ref 6.5–8.1)

## 2015-05-19 LAB — LIPASE, BLOOD: Lipase: 30 U/L (ref 11–51)

## 2015-05-19 MED ORDER — ONDANSETRON HCL 4 MG/2ML IJ SOLN
4.0000 mg | Freq: Once | INTRAMUSCULAR | Status: AC
  Administered 2015-05-19: 4 mg via INTRAVENOUS
  Filled 2015-05-19: qty 2

## 2015-05-19 MED ORDER — ONDANSETRON 4 MG PO TBDP
4.0000 mg | ORAL_TABLET | Freq: Once | ORAL | Status: AC | PRN
  Administered 2015-05-19: 4 mg via ORAL
  Filled 2015-05-19: qty 1

## 2015-05-19 MED ORDER — IOHEXOL 300 MG/ML  SOLN
25.0000 mL | Freq: Once | INTRAMUSCULAR | Status: AC | PRN
  Administered 2015-05-19: 25 mL via ORAL

## 2015-05-19 MED ORDER — POTASSIUM CHLORIDE CRYS ER 20 MEQ PO TBCR
40.0000 meq | EXTENDED_RELEASE_TABLET | Freq: Once | ORAL | Status: AC
  Administered 2015-05-19: 40 meq via ORAL
  Filled 2015-05-19: qty 2

## 2015-05-19 MED ORDER — MORPHINE SULFATE (PF) 4 MG/ML IV SOLN
4.0000 mg | Freq: Once | INTRAVENOUS | Status: AC
  Administered 2015-05-19: 4 mg via INTRAVENOUS
  Filled 2015-05-19: qty 1

## 2015-05-19 MED ORDER — ACETAMINOPHEN 500 MG PO TABS
1000.0000 mg | ORAL_TABLET | Freq: Once | ORAL | Status: AC
  Administered 2015-05-19: 1000 mg via ORAL
  Filled 2015-05-19: qty 2

## 2015-05-19 MED ORDER — CEPHALEXIN 500 MG PO CAPS: 500.0000 mg | ORAL_CAPSULE | Freq: Four times a day (QID) | ORAL | Status: DC

## 2015-05-19 MED ORDER — DEXTROSE 5 % IV SOLN
1.0000 g | Freq: Once | INTRAVENOUS | Status: AC
  Administered 2015-05-19: 1 g via INTRAVENOUS
  Filled 2015-05-19: qty 10

## 2015-05-19 MED ORDER — IOPAMIDOL (ISOVUE-300) INJECTION 61%
100.0000 mL | Freq: Once | INTRAVENOUS | Status: AC | PRN
  Administered 2015-05-19: 80 mL via INTRAVENOUS

## 2015-05-19 MED ORDER — SODIUM CHLORIDE 0.9 % IV BOLUS (SEPSIS)
1000.0000 mL | Freq: Once | INTRAVENOUS | Status: AC
  Administered 2015-05-19: 1000 mL via INTRAVENOUS

## 2015-05-19 MED ORDER — ONDANSETRON 4 MG PO TBDP: ORAL_TABLET | ORAL | Status: DC

## 2015-05-19 NOTE — Discharge Instructions (Signed)
Take zofran for nausea.   Stay hydrated.   Take tylenol, motrin for pain.   Take keflex four times daily for 7 days.   See your doctor.   Return to ER if you have fever for a week, severe abdominal pain, vomiting, dehydration.

## 2015-05-19 NOTE — ED Notes (Signed)
Pt with abdominal pain and nausea with no vomiting.  99.8 ish temp at home.  No diarrhea.

## 2015-05-19 NOTE — ED Notes (Signed)
Patient transported to CT 

## 2015-05-19 NOTE — ED Notes (Signed)
Pt. Requested to take Potassium after CT scan ,stated that she's already nauseous drinking the oral contrast and may upset her stomach with "that horse pill".

## 2015-05-19 NOTE — ED Provider Notes (Signed)
CSN: [id number]     Arrival date & time 05/19/15  1733 History   First MD Initiated Contact with Patient 05/19/15 1855     Chief Complaint  Patient presents with  . Abdominal Pain  . Nausea     (Consider location/radiation/quality/duration/timing/severity/associated sxs/prior Treatment) The history is provided by the patient.  VERLEAN NAVIDAD is a 70 y.o. female hx of HTN, pancreatitis, GERD, here with abdominal pain, vomiting. Patient has intermittent nausea and vomiting over the last month or so. Since yesterday she noticed some epigastric pain radiating to the back. She has several episodes of vomiting today but no diarrhea. She states that she had a cholecystectomy in the past and history of pancreatitis but does not drink any alcohol. No history of SBO in the past.     Past Medical History  Diagnosis Date  . Arthritis     finger  . Eczema     elbow  . Hypertension     under control, has been on med. x 10 yrs.  . Dental crowns present   . Mass of hand 05/2011    right  . Pancreatitis   . H. pylori infection   . Allergic rhinitis   . Osteopenia 11/2012    T score -1.1 left forearm FRAX 6.2%/0.1%  . Complication of anesthesia     difficult to awaken after gallbladder surgery  . GERD (gastroesophageal reflux disease)   . History of hiatal hernia   . Trochanteric bursitis of left hip   . Measles     hx of   . Mumps     hx of   . Rubella     hx of   . Bursitis of right hip    Past Surgical History  Procedure Laterality Date  . Varicose vein surgery  02/13/2003    ligation and stripping left greater saphenous; exc.  multiple varicosities  . Vaginal hysterectomy with a&p repair  1989  . Cataract extraction w/ intraocular lens  implant, bilateral  09/2010, 01/2011    bilateral  . Knee arthroscopy  02/12/2011    Procedure: ARTHROSCOPY KNEE;  Surgeon: Gearlean Alf;  Location: [place];  Service: Orthopedics;  Laterality: Left;  . Meniscus debridement   02/12/2011    Procedure: DEBRIDEMENT OF MENISCUS;  Surgeon: Gearlean Alf;  Location: [place];  Service: Orthopedics;  Laterality: Left;  . Chondroplasty  02/12/2011    Procedure: CHONDROPLASTY;  Surgeon: Gearlean Alf;  Location: [place];  Service: Orthopedics;  Laterality: Left;  Marland Kitchen Mass excision  06/05/2011    Procedure: EXCISION MASS;  Surgeon: Linna Hoff, MD;  Location: [place];  Service: Orthopedics;  Laterality: Right;  right thumb  . Eye surgery      laser right eye; due to right eye hemorrhage; had left eye retinal tear with subsequent surgery  . Mouth surgery      implants   . Tubal ligation    . Ablation saphenous vein w/ rfa  10-2011  . Cholecystectomy  MAY 2014  . Stent placement after cholecestectomy    . Abdominal hysterectomy    . Total knee arthroplasty Left 05/22/2014    Procedure: LEFT TOTAL KNEE ARTHROPLASTY;  Surgeon: Gaynelle Arabian, MD;  Location: [facility];  Service: Orthopedics;  Laterality: Left;   Family History  Problem Relation Age of Onset  . Diabetes Mother   . Hypertension Mother   . Heart disease Mother   .  Hypertension Father   . Cervical cancer Sister   . Heart disease Brother   . Lung cancer Brother   . Throat cancer Brother   . Colon cancer Mother 39  . Colon polyps Neg Hx    Social History  Substance Use Topics  . Smoking status: Never Smoker   . Smokeless tobacco: Never Used  . Alcohol Use: 0.0 oz/week     Comment: occasionally-Rare   OB History    Gravida Para Term Preterm AB TAB SAB Ectopic Multiple Living   3 3 3       3      Review of Systems  Gastrointestinal: Positive for vomiting and abdominal pain.  All other systems reviewed and are negative.     Allergies  Robaxin and Nitrofurantoin monohyd macro  Home Medications   Prior to Admission medications   Medication Sig Start Date End Date Taking? Authorizing Provider  acetaminophen (TYLENOL) 500 MG tablet Take  1,000 mg by mouth every 6 (six) hours as needed for moderate pain.   Yes Historical Provider, MD  ALPRAZolam (XANAX) 0.25 MG tablet Take 0.25 mg by mouth 3 (three) times daily as needed for anxiety. For anxiety.   Yes Historical Provider, MD  fluticasone (FLONASE) 50 MCG/ACT nasal spray Place 1 spray into both nostrils daily as needed for allergies or rhinitis.   Yes Historical Provider, MD  KRILL OIL PO Take 1 tablet by mouth at bedtime.   Yes Historical Provider, MD  Multiple Vitamin (MULTIVITAMIN WITH MINERALS) TABS tablet Take 1 tablet by mouth daily.   Yes Historical Provider, MD  omeprazole (PRILOSEC) 40 MG capsule Take 40 mg by mouth daily as needed (acid reflux).  03/07/14  Yes Historical Provider, MD  Polyethyl Glycol-Propyl Glycol (SYSTANE OP) Apply 1 drop to eye 3 (three) times daily as needed (dry eyes).   Yes Historical Provider, MD  potassium gluconate 595 (99 K) MG TABS tablet Take 595 mg by mouth at bedtime.   Yes Historical Provider, MD  rosuvastatin (CRESTOR) 5 MG tablet Take 5 mg by mouth at bedtime.   Yes Historical Provider, MD  triamcinolone cream (KENALOG) 0.1 % Apply 1 application topically daily as needed (itching.).   Yes Historical Provider, MD  triamterene-hydrochlorothiazide (MAXZIDE-25) 37.5-25 MG per tablet Take 1 tablet by mouth daily. 03/01/14  Yes Historical Provider, MD  estradiol (ESTRACE) 0.5 MG tablet Take 1/2 tablet daily 12/05/14   Huel Cote, NP   BP 109/68 mmHg  Pulse 78  Temp(Src) 99.8 F (37.7 C) (Oral)  Resp 18  SpO2 95% Physical Exam  Constitutional: She is oriented to person, place, and time.  Uncomfortable   HENT:  Head: Normocephalic.  Mouth/Throat: Oropharynx is clear and moist.  Eyes: Conjunctivae are normal. Pupils are equal, round, and reactive to light.  Neck: Normal range of motion. Neck supple.  Cardiovascular: Normal rate, regular rhythm and normal heart sounds.   Pulmonary/Chest: Effort normal and breath sounds normal. No  respiratory distress. She has no wheezes. She has no rales.  Abdominal: Soft.  + mild diffuse tenderness, worse in epigastric area   Musculoskeletal: Normal range of motion. She exhibits no edema or tenderness.  Neurological: She is alert and oriented to person, place, and time.  Skin: Skin is warm and dry.  Psychiatric: She has a normal mood and affect. Her behavior is normal. Judgment and thought content normal.  Nursing note and vitals reviewed.   ED Course  Procedures (including critical care time) Labs Review Labs Reviewed  COMPREHENSIVE METABOLIC PANEL - Abnormal; Notable for the following:    Potassium 3.2 (*)    Chloride 99 (*)    Glucose, Bld 110 (*)    BUN 21 (*)    Creatinine, Ser 1.26 (*)    AST 42 (*)    Total Bilirubin 1.5 (*)    GFR calc non Af Amer 43 (*)    GFR calc Af Amer 50 (*)    All other components within normal limits  CBC - Abnormal; Notable for the following:    WBC 13.7 (*)    All other components within normal limits  URINALYSIS, ROUTINE W REFLEX MICROSCOPIC (NOT AT [facility]) - Abnormal; Notable for the following:    APPearance HAZY (*)    Nitrite POSITIVE (*)    Leukocytes, UA SMALL (*)    All other components within normal limits  URINE MICROSCOPIC-ADD ON - Abnormal; Notable for the following:    Squamous Epithelial / LPF 0-5 (*)    Bacteria, UA MANY (*)    All other components within normal limits  URINE CULTURE  LIPASE, BLOOD    Imaging Review Ct Abdomen Pelvis W Contrast  05/19/2015  CLINICAL DATA:  70 year old female with mid abdominal pain and low-grade temperature and nausea. EXAM: CT ABDOMEN AND PELVIS WITH CONTRAST TECHNIQUE: Multidetector CT imaging of the abdomen and pelvis was performed using the standard protocol following bolus administration of intravenous contrast. CONTRAST:  42mL OMNIPAQUE IOHEXOL 300 MG/ML SOLN, 63mL ISOVUE-300 IOPAMIDOL (ISOVUE-300) INJECTION 61% COMPARISON:  Ultrasound dated 03/22/2013 and CT dated 05/19/2012  FINDINGS: The visualized lung bases are clear. All no intra-abdominal free air or free fluid. Cholecystectomy. Apparent mild fatty infiltration of the liver. Caps the pancreas, spleen, adrenal glands, kidneys, visualized ureters, and urinary bladder appear unremarkable. Hysterectomy. There are scattered sigmoid diverticula without active inflammatory changes. Moderate stool noted throughout the colon. There is no evidence of bowel obstruction or inflammation. Send The appendix is not visualized with certainty. No inflammatory changes identified in the right lower quadrant. All Mild aortoiliac atherosclerotic disease. The abdominal aorta and IVC appear unremarkable. No portal venous gas identified. There is no adenopathy. Small fat containing umbilical hernia. [age] there is degenerative changes of the spine. No acute fracture. IMPRESSION: No acute intra-abdominal pelvic pathology. Colonic diverticulosis. Electronically Signed   By: Anner Crete M.D.   On: 05/19/2015 21:14   I have personally reviewed and evaluated these images and lab results as part of my medical decision-making.   EKG Interpretation None      MDM   Final diagnoses:  None   MARILYNN MEROLA is a 70 y.o. female here with abdominal pain, vomiting. Consider pancreatitis vs appy vs SBO given previous abdominal surgeries vs pyelo. Will get labs, UA, CT ab/pel.   9:59 PM UA + UTI. WBC 13. Had low grade temp and was tachycardic initially but then pulse is now normal. CT showed no acute pathology. Given ceftriaxone. Urine culture sent. No vomiting, able to keep down oral contrast and juice.   Wandra Arthurs, MD 05/19/15 2211

## 2015-05-22 LAB — URINE CULTURE: Culture: >=100000

## 2015-05-23 ENCOUNTER — Telehealth (HOSPITAL_BASED_OUTPATIENT_CLINIC_OR_DEPARTMENT_OTHER): Payer: Self-pay

## 2015-05-23 NOTE — Telephone Encounter (Signed)
Post ED Visit - Positive Culture Follow-up  Culture report reviewed by antimicrobial stewardship pharmacist:  []  Elenor Quinones, Pharm.D. []  Heide Guile, Pharm.D., BCPS []  Parks Neptune, Pharm.D. []  Alycia Rossetti, Pharm.D., BCPS []  [place], [place].D., BCPS, AAHIVP []  Legrand Como, Pharm.D., BCPS, AAHIVP []  Milus Glazier, Pharm.D. []  [place], [place].Burnell Blanks PharmD.  Positive urine culture Treated with Cephalexin, organism sensitive to the same and no further patient follow-up is required at this time.  Genia Del 05/23/2015, 10:39 AM

## 2015-11-12 ENCOUNTER — Encounter: Payer: Self-pay | Admitting: Women's Health

## 2015-11-17 DIAGNOSIS — I10 Essential (primary) hypertension: Secondary | ICD-10-CM | POA: Insufficient documentation

## 2015-11-17 DIAGNOSIS — E7800 Pure hypercholesterolemia, unspecified: Secondary | ICD-10-CM | POA: Insufficient documentation

## 2015-12-06 ENCOUNTER — Encounter: Payer: Self-pay | Admitting: Women's Health

## 2015-12-06 ENCOUNTER — Ambulatory Visit (INDEPENDENT_AMBULATORY_CARE_PROVIDER_SITE_OTHER): Payer: Medicare Other | Admitting: Women's Health

## 2015-12-06 VITALS — BP 132/80 | Ht 63.0 in | Wt 163.0 lb

## 2015-12-06 DIAGNOSIS — M8580 Other specified disorders of bone density and structure, unspecified site: Secondary | ICD-10-CM

## 2015-12-06 NOTE — Patient Instructions (Signed)

## 2015-12-06 NOTE — Progress Notes (Signed)
Kayla Stokes Jul 08, 1945 [id number]    History:    Presents for breast and pelvic  exam no complaints. Stopped ERT this past year. 1999 TVH. 2014 T score -1.4 forearm, hip average 0.4 FRAX 6.2%/.1%.  Normal Pap and mammogram history. 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 2015 negative colonoscopy. Continues to work with a Clinical research associate, healthy lifestyle. Hypertension, hypercholesteremia primary care manages. Current on vaccines.  Past medical history, past surgical history, family history and social history were all reviewed and documented in the EPIC chart. Retired Pharmacist, hospital. In a book club. Enjoys gardening. Parents hypertension, mother diabetes.  ROS:  A ROS was performed and pertinent positives and negatives are included.  Exam:  Vitals:   12/06/15 1435  BP: 132/80  Weight: 163 lb (73.9 kg)  Height: 5\' 3"  (1.6 m)   Body mass index is 28.87 kg/m.   General appearance:  Normal Thyroid:  Symmetrical, normal in size, without palpable masses or nodularity. Respiratory  Auscultation:  Clear without wheezing or rhonchi Cardiovascular  Auscultation:  Regular rate, without rubs, murmurs or gallops  Edema/varicosities:  Not grossly evident Abdominal  Soft,nontender, without masses, guarding or rebound.  Liver/spleen:  No organomegaly noted  Hernia:  None appreciated  Skin  Inspection:  Grossly normal   Breasts: Examined lying and sitting.     Right: Without masses, retractions, discharge or axillary adenopathy.     Left: Without masses, retractions, discharge or axillary adenopathy. Gentitourinary   Inguinal/mons:  Normal without inguinal adenopathy  External genitalia:  Normal  BUS/Urethra/Skene's glands:  Normal  Vagina:  Normal  Cervix:  And uterus absent Adnexa/parametria:     Rt: Without masses or tenderness.   Lt: Without masses or tenderness.  Anus and perineum: Normal  Digital rectal exam: Normal sphincter tone without palpated masses or tenderness  Assessment/Plan:  70  y.o. MWF G3 P3 for breast and pelvic exam with no complaints..  1999 TVH stopped estradiol this past year Hypertension/hypercholesterolemia-primary care manages labs and meds Osteopenia without elevated FRAX   Plan: SBE's, continue annual 3-D screening mammogram history of dense breast tissue. Repeat DEXA. Home safety, fall prevention and importance of weightbearing exercise reviewed. Calcium rich diet, vitamin D 1000 daily encouraged. Have primary care check vitamin D level. UAHuel Cote WHNP, 3:06 PM 12/06/2015

## 2016-01-07 ENCOUNTER — Other Ambulatory Visit: Payer: Self-pay | Admitting: Gynecology

## 2016-01-07 DIAGNOSIS — M8580 Other specified disorders of bone density and structure, unspecified site: Secondary | ICD-10-CM

## 2016-01-08 ENCOUNTER — Ambulatory Visit (INDEPENDENT_AMBULATORY_CARE_PROVIDER_SITE_OTHER): Payer: Medicare Other

## 2016-01-08 ENCOUNTER — Other Ambulatory Visit: Payer: Self-pay | Admitting: Gynecology

## 2016-01-08 DIAGNOSIS — Z780 Asymptomatic menopausal state: Secondary | ICD-10-CM

## 2016-01-08 DIAGNOSIS — Z8739 Personal history of other diseases of the musculoskeletal system and connective tissue: Secondary | ICD-10-CM

## 2016-01-08 DIAGNOSIS — M8580 Other specified disorders of bone density and structure, unspecified site: Secondary | ICD-10-CM

## 2016-10-10 ENCOUNTER — Encounter: Payer: Self-pay | Admitting: Obstetrics & Gynecology

## 2016-10-10 ENCOUNTER — Other Ambulatory Visit: Payer: Self-pay | Admitting: Obstetrics & Gynecology

## 2016-10-10 ENCOUNTER — Ambulatory Visit (INDEPENDENT_AMBULATORY_CARE_PROVIDER_SITE_OTHER): Payer: Medicare Other | Admitting: Obstetrics & Gynecology

## 2016-10-10 VITALS — BP 138/88

## 2016-10-10 DIAGNOSIS — N898 Other specified noninflammatory disorders of vagina: Secondary | ICD-10-CM | POA: Diagnosis not present

## 2016-10-10 DIAGNOSIS — R300 Dysuria: Secondary | ICD-10-CM

## 2016-10-10 LAB — URINALYSIS W MICROSCOPIC + REFLEX CULTURE
BILIRUBIN URINE: NEGATIVE
CASTS: NONE SEEN [LPF]
CRYSTALS: NONE SEEN [HPF]
Glucose, UA: NEGATIVE
KETONES UR: NEGATIVE
Nitrite: NEGATIVE
PROTEIN: NEGATIVE
Specific Gravity, Urine: 1.015 (ref 1.001–1.035)
Yeast: NONE SEEN [HPF]
pH: 7 (ref 5.0–8.0)

## 2016-10-10 LAB — WET PREP FOR TRICH, YEAST, CLUE
Clue Cells Wet Prep HPF POC: NONE SEEN
TRICH WET PREP: NONE SEEN
YEAST WET PREP: NONE SEEN

## 2016-10-10 MED ORDER — FLUCONAZOLE 150 MG PO TABS: 150.0000 mg | ORAL_TABLET | Freq: Once | ORAL | 0 refills | Status: AC

## 2016-10-10 MED ORDER — SULFAMETHOXAZOLE-TRIMETHOPRIM 800-160 MG PO TABS: 1.0000 | ORAL_TABLET | Freq: Two times a day (BID) | ORAL | 0 refills | Status: AC

## 2016-10-10 NOTE — Patient Instructions (Signed)
1. Dysuria Probable Cystitis.  No evidence of Pyelonephritis.  Will treat with Bactrim 1 tab per mouth twice a day x 3 days.  Fluconazole after antibiotic treatment if symptoms of yeast vaginitis.  Counseling on UTI/usage of medication discussed. - Urinalysis with Culture Reflex  2. Vaginal discharge Wet prep negative. - WET PREP FOR [place], YEAST, CLUE  Kayla Stokes, it was a pleasure to meet you today!  I will inform you of your Urine Culture result as soon as available.   Urinary Tract Infection, Adult A urinary tract infection (UTI) is an infection of any part of the urinary tract, which includes the kidneys, ureters, bladder, and urethra. These organs make, store, and get rid of urine in the body. UTI can be a bladder infection (cystitis) or kidney infection (pyelonephritis). What are the causes? This infection may be caused by fungi, viruses, or bacteria. Bacteria are the most common cause of UTIs. This condition can also be caused by repeated incomplete emptying of the bladder during urination. What increases the risk? This condition is more likely to develop if:  You ignore your need to urinate or hold urine for long periods of time.  You do not empty your bladder completely during urination.  You wipe back to front after urinating or having a bowel movement, if you are female.  You are uncircumcised, if you are female.  You are constipated.  You have a urinary catheter that stays in place (indwelling).  You have a weak defense (immune) system.  You have a medical condition that affects your bowels, kidneys, or bladder.  You have diabetes.  You take antibiotic medicines frequently or for long periods of time, and the antibiotics no longer work well against certain types of infections (antibiotic resistance).  You take medicines that irritate your urinary tract.  You are exposed to chemicals that irritate your urinary tract.  You are female.  What are the signs or  symptoms? Symptoms of this condition include:  Fever.  Frequent urination or passing small amounts of urine frequently.  Needing to urinate urgently.  Pain or burning with urination.  Urine that smells bad or unusual.  Cloudy urine.  Pain in the lower abdomen or back.  Trouble urinating.  Blood in the urine.  Vomiting or being less hungry than normal.  Diarrhea or abdominal pain.  Vaginal discharge, if you are female.  How is this diagnosed? This condition is diagnosed with a medical history and physical exam. You will also need to provide a urine sample to test your urine. Other tests may be done, including:  Blood tests.  Sexually transmitted disease (STD) testing.  If you have had more than one UTI, a cystoscopy or imaging studies may be done to determine the cause of the infections. How is this treated? Treatment for this condition often includes a combination of two or more of the following:  Antibiotic medicine.  Other medicines to treat less common causes of UTI.  Over-the-counter medicines to treat pain.  Drinking enough water to stay hydrated.  Follow these instructions at home:  Take over-the-counter and prescription medicines only as told by your health care provider.  If you were prescribed an antibiotic, take it as told by your health care provider. Do not stop taking the antibiotic even if you start to feel better.  Avoid alcohol, caffeine, tea, and carbonated beverages. They can irritate your bladder.  Drink enough fluid to keep your urine clear or pale yellow.  Keep all follow-up visits as  told by your health care provider. This is important.  Make sure to: ? Empty your bladder often and completely. Do not hold urine for long periods of time. ? Empty your bladder before and after sex. ? Wipe from front to back after a bowel movement if you are female. Use each tissue one time when you wipe. Contact a health care provider if:  You have  back pain.  You have a fever.  You feel nauseous or vomit.  Your symptoms do not get better after 3 days.  Your symptoms go away and then return. Get help right away if:  You have severe back pain or lower abdominal pain.  You are vomiting and cannot keep down any medicines or water. This information is not intended to replace advice given to you by your health care provider. Make sure you discuss any questions you have with your health care provider. Document Released: 11/20/2004 Document Revised: 07/25/2015 Document Reviewed: 01/01/2015 Elsevier Interactive Patient Education  2017 [employer].

## 2016-10-10 NOTE — Progress Notes (Signed)
    Kayla Stokes [birth year] [id number]        70 y.o.  G3P3003   RP:  Burning with miction and urinary frequency x 5-7 days  HPI:  Burning with miction and urinary frequency x 5-7 days.  Tried Azo, increased water intake, cranberry juice...  Would feel a little better, than the Sxs would worsen again.  No current pelvic pain.  No fever.  C/O Vaginal itching as well.  Hospitalized for Diverticulitis 08/30/2016 and on ABTx afterwards x >10 days.  Past medical history,surgical history, problem list, medications, allergies, family history and social history were all reviewed and documented in the EPIC chart.  Directed ROS with pertinent positives and negatives documented in the history of present illness/assessment and plan.  Exam:  Vitals:   10/10/16 1052  BP: 138/88   General appearance:  Normal  CVAT neg bilaterally No suprapubic tenderness  Gyn exam:  Vulva normal.  Speculum:  Vagina normal.  Wet prep done.  U/A Tr. Blood, WBC packed, many Bacterias.  Nit. Neg.   Assessment/Plan:  71 y.o. G3P3003   1. Dysuria Probable Cystitis.  No evidence of Pyelonephritis.  Will treat with Bactrim 1 tab per mouth twice a day x 3 days.  Fluconazole after antibiotic treatment if symptoms of yeast vaginitis.  Counseling on UTI/usage of medication discussed. - Urinalysis with Culture Reflex  2. Vaginal discharge Wet prep negative. - WET PREP FOR [place], YEAST, CLUE  Princess Bruins MD, 11:18 AM 10/10/2016

## 2016-10-13 LAB — URINE CULTURE

## 2016-11-04 DIAGNOSIS — K5792 Diverticulitis of intestine, part unspecified, without perforation or abscess without bleeding: Secondary | ICD-10-CM | POA: Insufficient documentation

## 2016-11-13 ENCOUNTER — Encounter: Payer: Self-pay | Admitting: Women's Health

## 2016-12-10 ENCOUNTER — Ambulatory Visit (INDEPENDENT_AMBULATORY_CARE_PROVIDER_SITE_OTHER): Payer: Medicare Other | Admitting: Women's Health

## 2016-12-10 ENCOUNTER — Encounter: Payer: Self-pay | Admitting: Women's Health

## 2016-12-10 VITALS — BP 120/82 | Ht 63.0 in | Wt 174.0 lb

## 2016-12-10 DIAGNOSIS — Z23 Encounter for immunization: Secondary | ICD-10-CM | POA: Diagnosis not present

## 2016-12-10 DIAGNOSIS — Z01419 Encounter for gynecological examination (general) (routine) without abnormal findings: Secondary | ICD-10-CM

## 2016-12-10 NOTE — Progress Notes (Signed)
Kayla Stokes Jun 25, 1945 [id number]    History:    Presents for breast and pelvic exam. 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 Normal Pap and mammogram history. 2017 normal DEXA, hip -1 showed improvement. 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 2015 negative colonoscopy. Had a bout of diverticulitis this past summer, cleared with antibiotics. Current with vaccinations, planning to receive new shingles vaccine.  Past medical history, past surgical history, family history and social history were all reviewed and documented in the EPIC chart. Active lifestyle, regular exercise, in a book club. 3 children all doing well.  ROS:  A ROS was performed and pertinent positives and negatives are included.  Exam:  Vitals:   12/10/16 0853  BP: 120/82  Weight: 174 lb (78.9 kg)  Height: 5\' 3"  (1.6 m)   Body mass index is 30.82 kg/m.   General appearance:  Normal Thyroid:  Symmetrical, normal in size, without palpable masses or nodularity. Respiratory  Auscultation:  Clear without wheezing or rhonchi Cardiovascular  Auscultation:  Regular rate, without rubs, murmurs or gallops  Edema/varicosities:  Not grossly evident Abdominal  Soft,nontender, without masses, guarding or rebound.  Liver/spleen:  No organomegaly noted  Hernia:  None appreciated  Skin  Inspection:  Grossly normal   Breasts: Examined lying and sitting.     Right: Without masses, retractions, discharge or axillary adenopathy.     Left: Without masses, retractions, discharge or axillary adenopathy. Gentitourinary   Inguinal/mons:  Normal without inguinal adenopathy  External genitalia:  Normal  BUS/Urethra/Skene's glands:  Normal  Vagina:  Normal  Cervix:  And uterus absent  Adnexa/parametria:     Rt: Without masses or tenderness.   Lt: Without masses or tenderness.  Anus and perineum: Normal  Digital rectal exam: Normal sphincter tone without palpated masses or tenderness  Assessment/Plan:  71 y.o. m WF G3 P3 for breast  and pelvic exam with no complaints.  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 Home safety, fall prevention , weightbearing and balance exercise reviewed.     [place], 9:14 AM 12/10/2016

## 2016-12-10 NOTE — Patient Instructions (Signed)
Health Maintenance for Postmenopausal Women Menopause is a normal process in which your reproductive ability comes to an end. This process happens gradually over a span of months to years, usually between the ages of 22 and 9. Menopause is complete when you have missed 12 consecutive menstrual periods. It is important to talk with your health care provider about some of the most common conditions that affect postmenopausal women, such as heart disease, cancer, and bone loss (osteoporosis). Adopting a healthy lifestyle and getting preventive care can help to promote your health and wellness. Those actions can also lower your chances of developing some of these common conditions. What should I know about menopause? During menopause, you may experience a number of symptoms, such as:  Moderate-to-severe hot flashes.  Night sweats.  Decrease in sex drive.  Mood swings.  Headaches.  Tiredness.  Irritability.  Memory problems.  Insomnia.  Choosing to treat or not to treat menopausal changes is an individual decision that you make with your health care provider. What should I know about hormone replacement therapy and supplements? Hormone therapy products are effective for treating symptoms that are associated with menopause, such as hot flashes and night sweats. Hormone replacement carries certain risks, especially as you become older. If you are thinking about using estrogen or estrogen with progestin treatments, discuss the benefits and risks with your health care provider. What should I know about heart disease and stroke? Heart disease, heart attack, and stroke become more likely as you age. This may be due, in part, to the hormonal changes that your body experiences during menopause. These can affect how your body processes dietary fats, triglycerides, and cholesterol. Heart attack and stroke are both medical emergencies. There are many things that you can do to help prevent heart disease  and stroke:  Have your blood pressure checked at least every 1-2 years. High blood pressure causes heart disease and increases the risk of stroke.  If you are 53-22 years old, ask your health care provider if you should take aspirin to prevent a heart attack or a stroke.  Do not use any tobacco products, including cigarettes, chewing tobacco, or electronic cigarettes. If you need help quitting, ask your health care provider.  It is important to eat a healthy diet and maintain a healthy weight. ? Be sure to include plenty of vegetables, fruits, low-fat dairy products, and lean protein. ? Avoid eating foods that are high in solid fats, added sugars, or salt (sodium).  Get regular exercise. This is one of the most important things that you can do for your health. ? Try to exercise for at least 150 minutes each week. The type of exercise that you do should increase your heart rate and make you sweat. This is known as moderate-intensity exercise. ? Try to do strengthening exercises at least twice each week. Do these in addition to the moderate-intensity exercise.  Know your numbers.Ask your health care provider to check your cholesterol and your blood glucose. Continue to have your blood tested as directed by your health care provider.  What should I know about cancer screening? There are several types of cancer. Take the following steps to reduce your risk and to catch any cancer development as early as possible. Breast Cancer  Practice breast self-awareness. ? This means understanding how your breasts normally appear and feel. ? It also means doing regular breast self-exams. Let your health care provider know about any changes, no matter how small.  If you are 40  or older, have a clinician do a breast exam (clinical breast exam or CBE) every year. Depending on your age, family history, and medical history, it may be recommended that you also have a yearly breast X-ray (mammogram).  If you  have a family history of breast cancer, talk with your health care provider about genetic screening.  If you are at high risk for breast cancer, talk with your health care provider about having an MRI and a mammogram every year.  Breast cancer (BRCA) gene test is recommended for women who have family members with BRCA-related cancers. Results of the assessment will determine the need for genetic counseling and BRCA1 and for BRCA2 testing. BRCA-related cancers include these types: ? Breast. This occurs in males or females. ? Ovarian. ? Tubal. This may also be called fallopian tube cancer. ? Cancer of the abdominal or pelvic lining (peritoneal cancer). ? Prostate. ? Pancreatic.  Cervical, Uterine, and Ovarian Cancer Your health care provider may recommend that you be screened regularly for cancer of the pelvic organs. These include your ovaries, uterus, and vagina. This screening involves a pelvic exam, which includes checking for microscopic changes to the surface of your cervix (Pap test).  For women ages 21-65, health care providers may recommend a pelvic exam and a Pap test every three years. For women ages 79-65, they may recommend the Pap test and pelvic exam, combined with testing for human papilloma virus (HPV), every five years. Some types of HPV increase your risk of cervical cancer. Testing for HPV may also be done on women of any age who have unclear Pap test results.  Other health care providers may not recommend any screening for nonpregnant women who are considered low risk for pelvic cancer and have no symptoms. Ask your health care provider if a screening pelvic exam is right for you.  If you have had past treatment for cervical cancer or a condition that could lead to cancer, you need Pap tests and screening for cancer for at least 20 years after your treatment. If Pap tests have been discontinued for you, your risk factors (such as having a new sexual partner) need to be  reassessed to determine if you should start having screenings again. Some women have medical problems that increase the chance of getting cervical cancer. In these cases, your health care provider may recommend that you have screening and Pap tests more often.  If you have a family history of uterine cancer or ovarian cancer, talk with your health care provider about genetic screening.  If you have vaginal bleeding after reaching menopause, tell your health care provider.  There are currently no reliable tests available to screen for ovarian cancer.  Lung Cancer Lung cancer screening is recommended for adults 69-62 years old who are at high risk for lung cancer because of a history of smoking. A yearly low-dose CT scan of the lungs is recommended if you:  Currently smoke.  Have a history of at least 30 pack-years of smoking and you currently smoke or have quit within the past 15 years. A pack-year is smoking an average of one pack of cigarettes per day for one year.  Yearly screening should:  Continue until it has been 15 years since you quit.  Stop if you develop a health problem that would prevent you from having lung cancer treatment.  Colorectal Cancer  This type of cancer can be detected and can often be prevented.  Routine colorectal cancer screening usually begins at  age 42 and continues through age 45.  If you have risk factors for colon cancer, your health care provider may recommend that you be screened at an earlier age.  If you have a family history of colorectal cancer, talk with your health care provider about genetic screening.  Your health care provider may also recommend using home test kits to check for hidden blood in your stool.  A small camera at the end of a tube can be used to examine your colon directly (sigmoidoscopy or colonoscopy). This is done to check for the earliest forms of colorectal cancer.  Direct examination of the colon should be repeated every  5-10 years until age 71. However, if early forms of precancerous polyps or small growths are found or if you have a family history or genetic risk for colorectal cancer, you may need to be screened more often.  Skin Cancer  Check your skin from head to toe regularly.  Monitor any moles. Be sure to tell your health care provider: ? About any new moles or changes in moles, especially if there is a change in a mole's shape or color. ? If you have a mole that is larger than the size of a pencil eraser.  If any of your family members has a history of skin cancer, especially at a [place] age, talk with your health care provider about genetic screening.  Always use sunscreen. Apply sunscreen liberally and repeatedly throughout the day.  Whenever you are outside, protect yourself by wearing long sleeves, pants, a wide-brimmed hat, and sunglasses.  What should I know about osteoporosis? Osteoporosis is a condition in which bone destruction happens more quickly than new bone creation. After menopause, you may be at an increased risk for osteoporosis. To help prevent osteoporosis or the bone fractures that can happen because of osteoporosis, the following is recommended:  If you are 46-71 years old, get at least 1,000 mg of calcium and at least 600 mg of vitamin D per day.  If you are older than age 55 but younger than age 65, get at least 1,200 mg of calcium and at least 600 mg of vitamin D per day.  If you are older than age 54, get at least 1,200 mg of calcium and at least 800 mg of vitamin D per day.  Smoking and excessive alcohol intake increase the risk of osteoporosis. Eat foods that are rich in calcium and vitamin D, and do weight-bearing exercises several times each week as directed by your health care provider. What should I know about how menopause affects my mental health? Depression may occur at any age, but it is more common as you become older. Common symptoms of depression  include:  Low or sad mood.  Changes in sleep patterns.  Changes in appetite or eating patterns.  Feeling an overall lack of motivation or enjoyment of activities that you previously enjoyed.  Frequent crying spells.  Talk with your health care provider if you think that you are experiencing depression. What should I know about immunizations? It is important that you get and maintain your immunizations. These include:  Tetanus, diphtheria, and pertussis (Tdap) booster vaccine.  Influenza every year before the flu season begins.  Pneumonia vaccine.  Shingles vaccine.  Your health care provider may also recommend other immunizations. This information is not intended to replace advice given to you by your health care provider. Make sure you discuss any questions you have with your health care provider. Document Released: 04/04/2005  Document Revised: 08/31/2015 Document Reviewed: 11/14/2014 Elsevier Interactive Patient Education  2018 [employer]  

## 2016-12-13 LAB — URINALYSIS W MICROSCOPIC + REFLEX CULTURE
BILIRUBIN URINE: NEGATIVE
Glucose, UA: NEGATIVE
HYALINE CAST: NONE SEEN /LPF
Hgb urine dipstick: NEGATIVE
NITRITES URINE, INITIAL: POSITIVE — AB
PROTEIN: NEGATIVE
SPECIFIC GRAVITY, URINE: 1.016 (ref 1.001–1.03)
WBC, UA: >=60 /HPF — AB (ref 0–5)
pH: 7 (ref 5.0–8.0)

## 2016-12-13 LAB — URINE CULTURE
MICRO NUMBER:: 81164887
SPECIMEN QUALITY:: ADEQUATE

## 2016-12-13 LAB — CULTURE INDICATED

## 2016-12-15 ENCOUNTER — Other Ambulatory Visit: Payer: Self-pay | Admitting: Women's Health

## 2016-12-15 DIAGNOSIS — R8271 Bacteriuria: Secondary | ICD-10-CM

## 2016-12-15 MED ORDER — SULFAMETHOXAZOLE-TRIMETHOPRIM 800-160 MG PO TABS: 1.0000 | ORAL_TABLET | Freq: Two times a day (BID) | ORAL | 0 refills | Status: DC

## 2016-12-25 DEATH — deceased

## 2016-12-29 ENCOUNTER — Telehealth: Payer: Self-pay | Admitting: *Deleted

## 2016-12-29 NOTE — Telephone Encounter (Signed)
Please call when you have a minute let her know thank you for letting us know

## 2016-12-29 NOTE — Telephone Encounter (Signed)
Patient called stating she had repeat urine done at PCP and urine came back normal. Wanted to let you know.

## 2017-03-29 ENCOUNTER — Encounter (HOSPITAL_COMMUNITY): Payer: Self-pay

## 2017-03-29 ENCOUNTER — Other Ambulatory Visit: Payer: Self-pay

## 2017-03-29 ENCOUNTER — Emergency Department (HOSPITAL_COMMUNITY)
Admission: EM | Admit: 2017-03-29 | Discharge: 2017-03-29 | Disposition: A | Discharge status: Home / Self Care | Payer: Medicare Other | Attending: Emergency Medicine | Admitting: Emergency Medicine

## 2017-03-29 DIAGNOSIS — K2970 Gastritis, unspecified, without bleeding: Secondary | ICD-10-CM | POA: Diagnosis not present

## 2017-03-29 DIAGNOSIS — Z96652 Presence of left artificial knee joint: Secondary | ICD-10-CM | POA: Diagnosis not present

## 2017-03-29 DIAGNOSIS — R1013 Epigastric pain: Secondary | ICD-10-CM | POA: Diagnosis present

## 2017-03-29 DIAGNOSIS — R112 Nausea with vomiting, unspecified: Secondary | ICD-10-CM

## 2017-03-29 DIAGNOSIS — R109 Unspecified abdominal pain: Secondary | ICD-10-CM

## 2017-03-29 DIAGNOSIS — Z79899 Other long term (current) drug therapy: Secondary | ICD-10-CM | POA: Insufficient documentation

## 2017-03-29 DIAGNOSIS — I10 Essential (primary) hypertension: Secondary | ICD-10-CM | POA: Diagnosis not present

## 2017-03-29 LAB — CBC WITH DIFFERENTIAL/PLATELET
BASOS PCT: 0 %
Basophils Absolute: 0 10*3/uL (ref 0.0–0.1)
EOS ABS: 0.1 10*3/uL (ref 0.0–0.7)
EOS PCT: 1 %
HCT: 44.5 % (ref 36.0–46.0)
Hemoglobin: 15.5 g/dL — ABNORMAL HIGH (ref 12.0–15.0)
Lymphocytes Relative: 5 %
Lymphs Abs: 0.7 10*3/uL (ref 0.7–4.0)
MCH: 30.8 pg (ref 26.0–34.0)
MCHC: 34.8 g/dL (ref 30.0–36.0)
MCV: 88.3 fL (ref 78.0–100.0)
MONO ABS: 1.1 10*3/uL — AB (ref 0.1–1.0)
MONOS PCT: 7 %
Neutro Abs: 13.5 10*3/uL — ABNORMAL HIGH (ref 1.7–7.7)
Neutrophils Relative %: 87 %
Platelets: 294 10*3/uL (ref 150–400)
RBC: 5.04 MIL/uL (ref 3.87–5.11)
RDW: 14 % (ref 11.5–15.5)
WBC: 15.5 10*3/uL — ABNORMAL HIGH (ref 4.0–10.5)

## 2017-03-29 LAB — COMPREHENSIVE METABOLIC PANEL
ALK PHOS: 71 U/L (ref 38–126)
ALT: 29 U/L (ref 14–54)
AST: 36 U/L (ref 15–41)
Albumin: 4.5 g/dL (ref 3.5–5.0)
Anion gap: 12 (ref 5–15)
BUN: 19 mg/dL (ref 6–20)
CALCIUM: 9.6 mg/dL (ref 8.9–10.3)
CO2: 29 mmol/L (ref 22–32)
CREATININE: 1.16 mg/dL — AB (ref 0.44–1.00)
Chloride: 98 mmol/L — ABNORMAL LOW (ref 101–111)
GFR calc non Af Amer: 47 mL/min — ABNORMAL LOW (ref >60)
GFR, EST AFRICAN AMERICAN: 54 mL/min — AB (ref >60)
Glucose, Bld: 109 mg/dL — ABNORMAL HIGH (ref 65–99)
Potassium: 2.8 mmol/L — ABNORMAL LOW (ref 3.5–5.1)
SODIUM: 139 mmol/L (ref 135–145)
Total Bilirubin: 1.6 mg/dL — ABNORMAL HIGH (ref 0.3–1.2)
Total Protein: 8 g/dL (ref 6.5–8.1)

## 2017-03-29 LAB — LIPASE, BLOOD: LIPASE: 29 U/L (ref 11–51)

## 2017-03-29 MED ORDER — ONDANSETRON HCL 4 MG/2ML IJ SOLN
4.0000 mg | Freq: Once | INTRAMUSCULAR | Status: AC
  Administered 2017-03-29: 4 mg via INTRAVENOUS
  Filled 2017-03-29: qty 2

## 2017-03-29 MED ORDER — ONDANSETRON HCL 4 MG PO TABS: 4.0000 mg | ORAL_TABLET | Freq: Four times a day (QID) | ORAL | 0 refills | Status: DC

## 2017-03-29 MED ORDER — POTASSIUM CHLORIDE 10 MEQ/100ML IV SOLN
10.0000 meq | Freq: Once | INTRAVENOUS | Status: AC
  Administered 2017-03-29: 10 meq via INTRAVENOUS
  Filled 2017-03-29: qty 100

## 2017-03-29 MED ORDER — GI COCKTAIL ~~LOC~~
30.0000 mL | Freq: Once | ORAL | Status: AC
  Administered 2017-03-29: 30 mL via ORAL
  Filled 2017-03-29: qty 30

## 2017-03-29 MED ORDER — SODIUM CHLORIDE 0.9 % IV BOLUS (SEPSIS)
500.0000 mL | Freq: Once | INTRAVENOUS | Status: AC
  Administered 2017-03-29: 500 mL via INTRAVENOUS

## 2017-03-29 MED ORDER — SODIUM CHLORIDE 0.9 % IV SOLN
Freq: Once | INTRAVENOUS | Status: AC
  Administered 2017-03-29: 18:00:00 via INTRAVENOUS

## 2017-03-29 MED ORDER — MORPHINE SULFATE (PF) 2 MG/ML IV SOLN
2.0000 mg | Freq: Once | INTRAVENOUS | Status: AC
  Administered 2017-03-29: 2 mg via INTRAVENOUS
  Filled 2017-03-29: qty 1

## 2017-03-29 NOTE — ED Provider Notes (Signed)
[facility] DEPT Provider Note   CSN: [id number] Arrival date & time: 03/29/17  1533     History   Chief Complaint Chief Complaint  Patient presents with  . Abdominal Pain  . Emesis    HPI Kayla Stokes is a 72 y.o. female.  72 year old female with prior history of pancreatitis, diverticulitis, hyperlipidemia, and gastritis presents with complaint of epigastric pain and vomiting.  Patient reports that her symptoms started right around 11:00 this morning.  She was shopping when her symptoms began.  She complains of pain to the epigastrium which radiates to the back.  She reports that this feels similar to her prior episodes of pancreatitis.  She denies fever.  She denies any other abdominal pain.  She denies bowel movement changes.  She took a Pepcid prior to arrival with minimal improvement of her symptoms.   The history is provided by the patient.  Abdominal Pain   This is a recurrent problem. The current episode started 3 to 5 hours ago. The problem occurs rarely. The problem has not changed since onset.The pain is associated with an unknown factor. The pain is located in the epigastric region. The quality of the pain is burning. The pain is mild. Associated symptoms include nausea and vomiting. Nothing aggravates the symptoms. Nothing relieves the symptoms.  Emesis   Associated symptoms include abdominal pain.    Past Medical History:  Diagnosis Date  . Allergic rhinitis   . Arthritis    finger  . Bursitis of right hip   . Complication of anesthesia    difficult to awaken after gallbladder surgery  . Dental crowns present   . Eczema    elbow  . GERD (gastroesophageal reflux disease)   . H. pylori infection   . History of hiatal hernia   . Hypertension    under control, has been on med. x 10 yrs.  . Mass of hand 05/2011   right  . Measles    hx of   . Mumps    hx of   . Osteopenia 11/2012   T score -1.1 left forearm FRAX 6.2%/0.1%  .  Pancreatitis   . Rubella    hx of   . Trochanteric bursitis of left hip     Patient Active Problem List   Diagnosis Date Noted  . OA (osteoarthritis) of knee 05/22/2014  . History of pancreatitis 05/17/2012  . H. pylori infection   . Varicose veins of lower extremities with other complications 73/71/0626  . GASTRITIS 02/07/2010  . Hyperlipidemia 01/16/2010  . Anxiety state 01/16/2010  . Allergic rhinitis 01/16/2010  . DIVERTICULAR DISEASE 01/16/2010  . Constipation 01/16/2010  . NAUSEA AND VOMITING 01/16/2010  . PERSONAL HX COLONIC POLYPS 01/16/2010    Past Surgical History:  Procedure Laterality Date  . ABDOMINAL HYSTERECTOMY    . ABLATION SAPHENOUS VEIN W/ RFA  10-2011  . CATARACT EXTRACTION W/ INTRAOCULAR LENS  IMPLANT, BILATERAL  09/2010, 01/2011   bilateral  . CHOLECYSTECTOMY  MAY 2014  . CHONDROPLASTY  02/12/2011   Procedure: CHONDROPLASTY;  Surgeon: Gearlean Alf;  Location: [facility] [facility];  Service: Orthopedics;  Laterality: Left;  . EYE SURGERY     laser right eye; due to right eye hemorrhage; had left eye retinal tear with subsequent surgery  . KNEE ARTHROSCOPY  02/12/2011   Procedure: ARTHROSCOPY KNEE;  Surgeon: Gearlean Alf;  Location: [facility];  Service: Orthopedics;  Laterality: Left;  Marland Kitchen MASS EXCISION  06/05/2011   Procedure: EXCISION MASS;  Surgeon: Linna Hoff, MD;  Location: [facility];  Service: Orthopedics;  Laterality: Right;  right thumb  . MENISCUS DEBRIDEMENT  02/12/2011   Procedure: DEBRIDEMENT OF MENISCUS;  Surgeon: Gearlean Alf;  Location: [facility];  Service: Orthopedics;  Laterality: Left;  . MOUTH SURGERY     implants   . stent placement after cholecestectomy    . TOTAL KNEE ARTHROPLASTY Left 05/22/2014   Procedure: LEFT TOTAL KNEE ARTHROPLASTY;  Surgeon: Gaynelle Arabian, MD;  Location: [facility];  Service: Orthopedics;  Laterality: Left;  . TUBAL LIGATION    . VAGINAL  HYSTERECTOMY WITH A&P REPAIR  1989  . VARICOSE VEIN SURGERY  02/13/2003   ligation and stripping left greater saphenous; exc.  multiple varicosities    OB History    Gravida Para Term Preterm AB Living   3 3 3     3    SAB TAB Ectopic Multiple Live Births                   Home Medications    Prior to Admission medications   Medication Sig Start Date End Date Taking? Authorizing Provider  ALPRAZolam (XANAX) 0.25 MG tablet Take 0.25 mg by mouth 3 (three) times daily as needed for anxiety. For anxiety.    [provider]  fluticasone (FLONASE) 50 MCG/ACT nasal spray Place 1 spray into both nostrils daily as needed for allergies or rhinitis.    [provider]  omeprazole (PRILOSEC) 40 MG capsule Take 40 mg by mouth daily as needed (acid reflux).  03/07/14   [provider]  Polyethyl Glycol-Propyl Glycol (SYSTANE OP) Apply 1 drop to eye 3 (three) times daily as needed (dry eyes).    [provider]  potassium gluconate 595 (99 K) MG TABS tablet Take 595 mg by mouth at bedtime.    [provider]  rosuvastatin (CRESTOR) 5 MG tablet Take 5 mg by mouth at bedtime.    [provider]  sulfamethoxazole-trimethoprim (BACTRIM DS,SEPTRA DS) 800-160 MG tablet Take 1 tablet by mouth 2 (two) times daily. 12/15/16   Huel Cote, NP  triamcinolone cream (KENALOG) 0.1 % Apply 1 application topically daily as needed (itching.).    [provider]  triamterene-hydrochlorothiazide (MAXZIDE-25) 37.5-25 MG per tablet Take 1 tablet by mouth daily. 03/01/14   [provider]    Family History Family History  Problem Relation Age of Onset  . Heart disease Brother   . Lung cancer Brother   . Diabetes Mother   . Hypertension Mother   . Heart disease Mother   . Colon cancer Mother 46  . Hypertension Father   . Cervical cancer Sister   . Throat cancer Brother   . Colon polyps Neg Hx     Social History Social History   Tobacco  Use  . Smoking status: Never Smoker  . Smokeless tobacco: Never Used  Substance Use Topics  . Alcohol use: Yes    Alcohol/week: 0.0 oz    Comment: occasionally-Rare  . Drug use: No     Allergies   Mobic [meloxicam]; Robaxin [methocarbamol]; and Nitrofurantoin monohyd macro   Review of Systems Review of Systems  Gastrointestinal: Positive for abdominal pain, nausea and vomiting.  All other systems reviewed and are negative.    Physical Exam Updated Vital Signs BP (!) 140/91 (BP Location: Left Arm)   Temp 98.1 F (36.7 C) (Oral)   Resp Marland Kitchen)  109   Ht 5\' 3"  (1.6 m)   Wt 73 kg (161 lb)   SpO2 97%   BMI 28.52 kg/m   Physical Exam  Constitutional: She is oriented to person, place, and time. She appears well-developed and well-nourished. No distress.  HENT:  Head: Normocephalic and atraumatic.  Mouth/Throat: Oropharynx is clear and moist.  Eyes: Conjunctivae and EOM are normal. Pupils are equal, round, and reactive to light.  Neck: Normal range of motion. Neck supple.  Cardiovascular: Normal rate, regular rhythm and normal heart sounds.  Pulmonary/Chest: Effort normal and breath sounds normal. No respiratory distress.  Abdominal: Soft. She exhibits no distension. There is tenderness.  Mild tenderness with palpation of the epigastrium.  No rebound  No Peritoneal findings.  Musculoskeletal: Normal range of motion. She exhibits no edema or deformity.  Neurological: She is alert and oriented to person, place, and time.  Skin: Skin is warm and dry.  Psychiatric: She has a normal mood and affect.  Nursing note and vitals reviewed.    ED Treatments / Results  Labs (all labs ordered are listed, but only abnormal results are displayed) Labs Reviewed  COMPREHENSIVE METABOLIC PANEL - Abnormal; Notable for the following components:      Result Value   Potassium 2.8 (*)    Chloride 98 (*)    Glucose, Bld 109 (*)    Creatinine, Ser 1.16 (*)    Total Bilirubin 1.6 (*)     GFR calc non Af Amer 47 (*)    GFR calc Af Amer 54 (*)    All other components within normal limits  CBC WITH DIFFERENTIAL/PLATELET - Abnormal; Notable for the following components:   WBC 15.5 (*)    Hemoglobin 15.5 (*)    Neutro Abs 13.5 (*)    Monocytes Absolute 1.1 (*)    All other components within normal limits  LIPASE, BLOOD  URINALYSIS, ROUTINE W REFLEX MICROSCOPIC    EKG  EKG Interpretation None       Radiology No results found.  Procedures Procedures (including critical care time)  Medications Ordered in ED Medications  sodium chloride 0.9 % bolus 500 mL (500 mLs Intravenous New Bag/Given 03/29/17 1632)  ondansetron (ZOFRAN) injection 4 mg (4 mg Intravenous Given 03/29/17 1633)  gi cocktail (Maalox,Lidocaine,Donnatal) (30 mLs Oral Given 03/29/17 1633)  morphine 2 MG/ML injection 2 mg (2 mg Intravenous Given 03/29/17 1633)     Initial Impression / Assessment and Plan / ED Course  I have reviewed the triage vital signs and the nursing notes.  Pertinent labs & imaging results that were available during my care of the patient were reviewed by me and considered in my medical decision making (see chart for details).    1730 Patient feels improved.  Will replace potassium and re-evaluate. Patient with history of hypokalemia in the past - she takes oral supplementation at home.    MDM  Screen complete  Patient's presentation today is consistent with likely gastritis.  She feels significantly improved following ED treatment.  Screening laboratory evaluations reveal mild hypokalemia -- which was repleted.  She desires discharge home.  She understands the need for close follow-up with her primary and primary GI.  Strict return precautions given and understood.  Final Clinical Impressions(s) / ED Diagnoses   Final diagnoses:  Nausea and vomiting, intractability of vomiting not specified, unspecified vomiting type  Abdominal pain, unspecified abdominal location  Gastritis  without bleeding, unspecified chronicity, unspecified gastritis type    ED Discharge Orders  Ordered    ondansetron (ZOFRAN) 4 MG tablet  Every 6 hours     03/29/17 1926       Valarie Merino, MD 03/29/17 [phone]

## 2017-03-29 NOTE — ED Triage Notes (Signed)
Pt reports that she had a sudden onset of epigastric pain with vomiting beginning 5-6 hrs ago. Pt reports bloating. Pt has hx of pancreatitis and diverticulitis. Pt reports this feels like her episodes of pancreatitis in the past.

## 2017-06-29 DIAGNOSIS — N183 Chronic kidney disease, stage 3 unspecified: Secondary | ICD-10-CM | POA: Insufficient documentation

## 2017-07-22 DIAGNOSIS — H9113 Presbycusis, bilateral: Secondary | ICD-10-CM | POA: Insufficient documentation

## 2017-07-22 DIAGNOSIS — H833X3 Noise effects on inner ear, bilateral: Secondary | ICD-10-CM | POA: Insufficient documentation

## 2017-07-23 DIAGNOSIS — M25561 Pain in right knee: Secondary | ICD-10-CM | POA: Insufficient documentation

## 2017-12-23 ENCOUNTER — Ambulatory Visit: Payer: Medicare Other | Admitting: Women's Health

## 2017-12-23 ENCOUNTER — Encounter: Payer: Self-pay | Admitting: Women's Health

## 2017-12-23 VITALS — BP 130/80 | Ht 63.0 in | Wt 160.0 lb

## 2017-12-23 DIAGNOSIS — Z01419 Encounter for gynecological examination (general) (routine) without abnormal findings: Secondary | ICD-10-CM

## 2017-12-23 NOTE — Patient Instructions (Signed)
Health Maintenance for Postmenopausal Women Menopause is a normal process in which your reproductive ability comes to an end. This process happens gradually over a span of months to years, usually between the ages of 22 and 9. Menopause is complete when you have missed 12 consecutive menstrual periods. It is important to talk with your health care provider about some of the most common conditions that affect postmenopausal women, such as heart disease, cancer, and bone loss (osteoporosis). Adopting a healthy lifestyle and getting preventive care can help to promote your health and wellness. Those actions can also lower your chances of developing some of these common conditions. What should I know about menopause? During menopause, you may experience a number of symptoms, such as:  Moderate-to-severe hot flashes.  Night sweats.  Decrease in sex drive.  Mood swings.  Headaches.  Tiredness.  Irritability.  Memory problems.  Insomnia.  Choosing to treat or not to treat menopausal changes is an individual decision that you make with your health care provider. What should I know about hormone replacement therapy and supplements? Hormone therapy products are effective for treating symptoms that are associated with menopause, such as hot flashes and night sweats. Hormone replacement carries certain risks, especially as you become older. If you are thinking about using estrogen or estrogen with progestin treatments, discuss the benefits and risks with your health care provider. What should I know about heart disease and stroke? Heart disease, heart attack, and stroke become more likely as you age. This may be due, in part, to the hormonal changes that your body experiences during menopause. These can affect how your body processes dietary fats, triglycerides, and cholesterol. Heart attack and stroke are both medical emergencies. There are many things that you can do to help prevent heart disease  and stroke:  Have your blood pressure checked at least every 1-2 years. High blood pressure causes heart disease and increases the risk of stroke.  If you are 53-22 years old, ask your health care provider if you should take aspirin to prevent a heart attack or a stroke.  Do not use any tobacco products, including cigarettes, chewing tobacco, or electronic cigarettes. If you need help quitting, ask your health care provider.  It is important to eat a healthy diet and maintain a healthy weight. ? Be sure to include plenty of vegetables, fruits, low-fat dairy products, and lean protein. ? Avoid eating foods that are high in solid fats, added sugars, or salt (sodium).  Get regular exercise. This is one of the most important things that you can do for your health. ? Try to exercise for at least 150 minutes each week. The type of exercise that you do should increase your heart rate and make you sweat. This is known as moderate-intensity exercise. ? Try to do strengthening exercises at least twice each week. Do these in addition to the moderate-intensity exercise.  Know your numbers.Ask your health care provider to check your cholesterol and your blood glucose. Continue to have your blood tested as directed by your health care provider.  What should I know about cancer screening? There are several types of cancer. Take the following steps to reduce your risk and to catch any cancer development as early as possible. Breast Cancer  Practice breast self-awareness. ? This means understanding how your breasts normally appear and feel. ? It also means doing regular breast self-exams. Let your health care provider know about any changes, no matter how small.  If you are 40  or older, have a clinician do a breast exam (clinical breast exam or CBE) every year. Depending on your age, family history, and medical history, it may be recommended that you also have a yearly breast X-ray (mammogram).  If you  have a family history of breast cancer, talk with your health care provider about genetic screening.  If you are at high risk for breast cancer, talk with your health care provider about having an MRI and a mammogram every year.  Breast cancer (BRCA) gene test is recommended for women who have family members with BRCA-related cancers. Results of the assessment will determine the need for genetic counseling and BRCA1 and for BRCA2 testing. BRCA-related cancers include these types: ? Breast. This occurs in males or females. ? Ovarian. ? Tubal. This may also be called fallopian tube cancer. ? Cancer of the abdominal or pelvic lining (peritoneal cancer). ? Prostate. ? Pancreatic.  Cervical, Uterine, and Ovarian Cancer Your health care provider may recommend that you be screened regularly for cancer of the pelvic organs. These include your ovaries, uterus, and vagina. This screening involves a pelvic exam, which includes checking for microscopic changes to the surface of your cervix (Pap test).  For women ages 21-65, health care providers may recommend a pelvic exam and a Pap test every three years. For women ages 79-65, they may recommend the Pap test and pelvic exam, combined with testing for human papilloma virus (HPV), every five years. Some types of HPV increase your risk of cervical cancer. Testing for HPV may also be done on women of any age who have unclear Pap test results.  Other health care providers may not recommend any screening for nonpregnant women who are considered low risk for pelvic cancer and have no symptoms. Ask your health care provider if a screening pelvic exam is right for you.  If you have had past treatment for cervical cancer or a condition that could lead to cancer, you need Pap tests and screening for cancer for at least 20 years after your treatment. If Pap tests have been discontinued for you, your risk factors (such as having a new sexual partner) need to be  reassessed to determine if you should start having screenings again. Some women have medical problems that increase the chance of getting cervical cancer. In these cases, your health care provider may recommend that you have screening and Pap tests more often.  If you have a family history of uterine cancer or ovarian cancer, talk with your health care provider about genetic screening.  If you have vaginal bleeding after reaching menopause, tell your health care provider.  There are currently no reliable tests available to screen for ovarian cancer.  Lung Cancer Lung cancer screening is recommended for adults 69-62 years old who are at high risk for lung cancer because of a history of smoking. A yearly low-dose CT scan of the lungs is recommended if you:  Currently smoke.  Have a history of at least 30 pack-years of smoking and you currently smoke or have quit within the past 15 years. A pack-year is smoking an average of one pack of cigarettes per day for one year.  Yearly screening should:  Continue until it has been 15 years since you quit.  Stop if you develop a health problem that would prevent you from having lung cancer treatment.  Colorectal Cancer  This type of cancer can be detected and can often be prevented.  Routine colorectal cancer screening usually begins at  age 42 and continues through age 45.  If you have risk factors for colon cancer, your health care provider may recommend that you be screened at an earlier age.  If you have a family history of colorectal cancer, talk with your health care provider about genetic screening.  Your health care provider may also recommend using home test kits to check for hidden blood in your stool.  A small camera at the end of a tube can be used to examine your colon directly (sigmoidoscopy or colonoscopy). This is done to check for the earliest forms of colorectal cancer.  Direct examination of the colon should be repeated every  5-10 years until age 71. However, if early forms of precancerous polyps or small growths are found or if you have a family history or genetic risk for colorectal cancer, you may need to be screened more often.  Skin Cancer  Check your skin from head to toe regularly.  Monitor any moles. Be sure to tell your health care provider: ? About any new moles or changes in moles, especially if there is a change in a mole's shape or color. ? If you have a mole that is larger than the size of a pencil eraser.  If any of your family members has a history of skin cancer, especially at a young age, talk with your health care provider about genetic screening.  Always use sunscreen. Apply sunscreen liberally and repeatedly throughout the day.  Whenever you are outside, protect yourself by wearing long sleeves, pants, a wide-brimmed hat, and sunglasses.  What should I know about osteoporosis? Osteoporosis is a condition in which bone destruction happens more quickly than new bone creation. After menopause, you may be at an increased risk for osteoporosis. To help prevent osteoporosis or the bone fractures that can happen because of osteoporosis, the following is recommended:  If you are 46-71 years old, get at least 1,000 mg of calcium and at least 600 mg of vitamin D per day.  If you are older than age 55 but younger than age 65, get at least 1,200 mg of calcium and at least 600 mg of vitamin D per day.  If you are older than age 54, get at least 1,200 mg of calcium and at least 800 mg of vitamin D per day.  Smoking and excessive alcohol intake increase the risk of osteoporosis. Eat foods that are rich in calcium and vitamin D, and do weight-bearing exercises several times each week as directed by your health care provider. What should I know about how menopause affects my mental health? Depression may occur at any age, but it is more common as you become older. Common symptoms of depression  include:  Low or sad mood.  Changes in sleep patterns.  Changes in appetite or eating patterns.  Feeling an overall lack of motivation or enjoyment of activities that you previously enjoyed.  Frequent crying spells.  Talk with your health care provider if you think that you are experiencing depression. What should I know about immunizations? It is important that you get and maintain your immunizations. These include:  Tetanus, diphtheria, and pertussis (Tdap) booster vaccine.  Influenza every year before the flu season begins.  Pneumonia vaccine.  Shingles vaccine.  Your health care provider may also recommend other immunizations. This information is not intended to replace advice given to you by your health care provider. Make sure you discuss any questions you have with your health care provider. Document Released: 04/04/2005  Document Revised: 08/31/2015 Document Reviewed: 11/14/2014 Elsevier Interactive Patient Education  2018 [employer]  

## 2017-12-23 NOTE — Progress Notes (Signed)
Kayla Stokes 1945/07/02 [id number]    History:    Presents for breast and pelvic exam with no complaints.  35 TVH with A&P repair.  Normal Pap and mammogram history.  Vaccines current, recently had first Shingrex and will return for second.  12/2015 normal DEXA T score -1 at wrist, bilateral hip -0.1 FRAX 6.2% / 0.1%.  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  2015- colonoscopy due for repeat next year mother had colon cancer.  Primary care manages hypertension hypercholesterolemia.  Has lost 14 pounds in the past year with diet and exercise.  Past medical history, past surgical history, family history and social history were all reviewed and documented in the EPIC chart.  Goes to a gym on a regular basis and is in a book club.  3 daughters all doing well.  ROS:  A ROS was performed and pertinent positives and negatives are included.  Exam:  Vitals:   12/23/17 0919  BP: 130/80  Weight: 160 lb (72.6 kg)  Height: 5\' 3"  (1.6 m)   Body mass index is 28.34 kg/m.   General appearance:  Normal Thyroid:  Symmetrical, normal in size, without palpable masses or nodularity. Respiratory  Auscultation:  Clear without wheezing or rhonchi Cardiovascular  Auscultation:  Regular rate, without rubs, murmurs or gallops  Edema/varicosities:  Not grossly evident Abdominal  Soft,nontender, without masses, guarding or rebound.  Liver/spleen:  No organomegaly noted  Hernia:  None appreciated  Skin  Inspection:  Grossly normal   Breasts: Examined lying and sitting.     Right: Without masses, retractions, discharge or axillary adenopathy.     Left: Without masses, retractions, discharge or axillary adenopathy. Gentitourinary   Inguinal/mons:  Normal without inguinal adenopathy  External genitalia:  Normal  BUS/Urethra/Skene's glands:  Normal  Vagina: + 1 cystocele  Cervix: Absent  Uterus: Absent  Adnexa/parametria:     Rt: Without masses or tenderness.   Lt: Without masses or tenderness.  Anus  and perineum: Normal  Digital rectal exam: Normal sphincter tone without palpated masses or tenderness  Assessment/Plan:  72 y.o. MWF G3, P3 for breast and pelvic exam with no complaints.  13 TVH  with A&P repair on no HRT  +1 cystocele asymptomatic Hypertension, hypercholesterolemia-primary care manages labs, vaccines and meds  Plan: Congratulated on weight loss and healthy lifestyle.  SBE's, continue annual mammogram and regular exercise, calcium rich foods, vitamin D 2000 daily encouraged.  Home safety and fall prevention discussed.  Will check about last Tdap and received at primary care if needed.   [place], 9:53 AM 12/23/2017

## 2017-12-24 LAB — URINALYSIS, COMPLETE W/RFL CULTURE
BILIRUBIN URINE: NEGATIVE
Bacteria, UA: NONE SEEN /HPF
Glucose, UA: NEGATIVE
Hgb urine dipstick: NEGATIVE
Hyaline Cast: NONE SEEN /LPF
Ketones, ur: NEGATIVE
LEUKOCYTE ESTERASE: NEGATIVE
Nitrites, Initial: NEGATIVE
PH: 7.5 (ref 5.0–8.0)
Protein, ur: NEGATIVE
RBC / HPF: NONE SEEN /HPF (ref 0–2)
SPECIFIC GRAVITY, URINE: 1.009 (ref 1.001–1.03)
Squamous Epithelial / HPF: NONE SEEN /HPF (ref <=5)
WBC, UA: NONE SEEN /HPF (ref 0–5)

## 2017-12-24 LAB — NO CULTURE INDICATED

## 2018-01-05 ENCOUNTER — Other Ambulatory Visit: Payer: Self-pay

## 2018-01-05 DIAGNOSIS — R609 Edema, unspecified: Secondary | ICD-10-CM

## 2018-02-04 ENCOUNTER — Ambulatory Visit (INDEPENDENT_AMBULATORY_CARE_PROVIDER_SITE_OTHER): Payer: Medicare Other

## 2018-02-04 ENCOUNTER — Ambulatory Visit: Payer: Medicare Other | Admitting: Podiatry

## 2018-02-04 ENCOUNTER — Encounter: Payer: Self-pay | Admitting: Podiatry

## 2018-02-04 ENCOUNTER — Other Ambulatory Visit: Payer: Self-pay | Admitting: Podiatry

## 2018-02-04 VITALS — BP 158/89 | HR 65 | Resp 16

## 2018-02-04 DIAGNOSIS — M67471 Ganglion, right ankle and foot: Secondary | ICD-10-CM | POA: Diagnosis not present

## 2018-02-04 DIAGNOSIS — M898X7 Other specified disorders of bone, ankle and foot: Secondary | ICD-10-CM

## 2018-02-04 NOTE — Progress Notes (Signed)
Subjective:  Patient ID: Kayla Stokes, female    DOB: 1945-11-20,  MRN: [id number] HPI Chief Complaint  Patient presents with  . Foot Pain    Dorsal/lateral foot right - small knot x 1 month, increased in size, sometimes burns, tried Advil  . New Patient (Initial Visit)    Est pt 3+    72 y.o. female presents with the above complaint.   ROS: Denies fever chills nausea vomiting muscle aches pains calf pain back pain chest pain shortness of breath.  Past Medical History:  Diagnosis Date  . Allergic rhinitis   . Arthritis    finger  . Bursitis of right hip   . Complication of anesthesia    difficult to awaken after gallbladder surgery  . Dental crowns present   . Eczema    elbow  . GERD (gastroesophageal reflux disease)   . H. pylori infection   . History of hiatal hernia   . Hypertension    under control, has been on med. x 10 yrs.  . Mass of hand 05/2011   right  . Measles    hx of   . Mumps    hx of   . Osteopenia 11/2012   T score -1.1 left forearm FRAX 6.2%/0.1%  . Pancreatitis   . Rubella    hx of   . Trochanteric bursitis of left hip    Past Surgical History:  Procedure Laterality Date  . ABDOMINAL HYSTERECTOMY    . ABLATION SAPHENOUS VEIN W/ RFA  10-2011  . CATARACT EXTRACTION W/ INTRAOCULAR LENS  IMPLANT, BILATERAL  09/2010, 01/2011   bilateral  . CHOLECYSTECTOMY  MAY 2014  . CHONDROPLASTY  02/12/2011   Procedure: CHONDROPLASTY;  Surgeon: Gearlean Alf;  Location: [place];  Service: Orthopedics;  Laterality: Left;  . EYE SURGERY     laser right eye; due to right eye hemorrhage; had left eye retinal tear with subsequent surgery  . KNEE ARTHROSCOPY  02/12/2011   Procedure: ARTHROSCOPY KNEE;  Surgeon: Gearlean Alf;  Location: [place];  Service: Orthopedics;  Laterality: Left;  Marland Kitchen MASS EXCISION  06/05/2011   Procedure: EXCISION MASS;  Surgeon: Linna Hoff, MD;  Location: [place];  Service:  Orthopedics;  Laterality: Right;  right thumb  . MENISCUS DEBRIDEMENT  02/12/2011   Procedure: DEBRIDEMENT OF MENISCUS;  Surgeon: Gearlean Alf;  Location: [place];  Service: Orthopedics;  Laterality: Left;  . MOUTH SURGERY     implants   . stent placement after cholecestectomy    . TOTAL KNEE ARTHROPLASTY Left 05/22/2014   Procedure: LEFT TOTAL KNEE ARTHROPLASTY;  Surgeon: Gaynelle Arabian, MD;  Location: [facility];  Service: Orthopedics;  Laterality: Left;  . TUBAL LIGATION    . VAGINAL HYSTERECTOMY WITH A&P REPAIR  1989  . VARICOSE VEIN SURGERY  02/13/2003   ligation and stripping left greater saphenous; exc.  multiple varicosities    Current Outpatient Medications:  .  ALPRAZolam (XANAX) 0.25 MG tablet, Take 0.25 mg by mouth 3 (three) times daily as needed for anxiety. For anxiety., Disp: , Rfl:  .  fluticasone (FLONASE) 50 MCG/ACT nasal spray, Place 1 spray into both nostrils daily as needed for allergies or rhinitis., Disp: , Rfl:  .  omeprazole (PRILOSEC) 40 MG capsule, Take 40 mg by mouth daily as needed (acid reflux). , Disp: , Rfl: 3 .  Polyethyl Glycol-Propyl Glycol (SYSTANE OP), Apply 1 drop to eye 3 (three) times  daily as needed (dry eyes)., Disp: , Rfl:  .  potassium gluconate 595 (99 K) MG TABS tablet, Take 595 mg by mouth at bedtime., Disp: , Rfl:  .  rosuvastatin (CRESTOR) 5 MG tablet, Take 5 mg by mouth at bedtime., Disp: , Rfl:  .  triamcinolone cream (KENALOG) 0.1 %, Apply 1 application topically daily as needed (itching.)., Disp: , Rfl:  .  triamterene-hydrochlorothiazide (MAXZIDE-25) 37.5-25 MG per tablet, Take 1 tablet by mouth daily., Disp: , Rfl: 11  Allergies  Allergen Reactions  . Mobic [Meloxicam]   . Robaxin [Methocarbamol] Nausea And Vomiting    Dull headache.   . Nitrofurantoin Monohyd Macro Other (See Comments)    SEVERE STOMACH PAIN   Review of Systems Objective:   Vitals:   02/04/18 0942  BP: (!) 158/89  Pulse: 65  Resp: 16     General: Well developed, nourished, in no acute distress, alert and oriented x3   Dermatological: Skin is warm, dry and supple bilateral. Nails x 10 are well maintained; remaining integument appears unremarkable at this time. There are no open sores, no preulcerative lesions, no rash or signs of infection present.  Very small 5 mm pea-sized what appears to be ganglion lesion coming off of the extensor tendon sheath for the fifth toe.  Vascular: Dorsalis Pedis artery and Posterior Tibial artery pedal pulses are 2/4 bilateral with immedate capillary fill time. Pedal hair growth present. No varicosities and no lower extremity edema present bilateral.   Neruologic: Grossly intact via light touch bilateral. Vibratory intact via tuning fork bilateral. Protective threshold with Semmes Wienstein monofilament intact to all pedal sites bilateral. Patellar and Achilles deep tendon reflexes 2+ bilateral. No Babinski or clonus noted bilateral.   Musculoskeletal: No gross boney pedal deformities bilateral. No pain, crepitus, or limitation noted with foot and ankle range of motion bilateral. Muscular strength 5/5 in all groups tested bilateral.  Gait: Unassisted, Nonantalgic.    Radiographs:  Radiographs do not demonstrate any type of soft tissue lesion though there is a marker to the dorsal lateral aspect overlying the fifth metatarsal area of the right foot.  Assessment & Plan:   Assessment: Ganglion right foot  Plan: Aspirated ganglion fifth metatarsal area right foot injected Kenalog.     Max T. Brandon, [place]

## 2018-03-08 ENCOUNTER — Telehealth (HOSPITAL_COMMUNITY): Payer: Self-pay | Admitting: Surgery

## 2018-03-08 NOTE — Telephone Encounter (Signed)
Attempted to contact patient to confirm appointments on 03/09/2018

## 2018-03-09 ENCOUNTER — Ambulatory Visit (HOSPITAL_COMMUNITY)
Admission: RE | Admit: 2018-03-09 | Discharge: 2018-03-09 | Disposition: A | Discharge status: Home / Self Care | Payer: Medicare Other | Source: Ambulatory Visit | Attending: Family Medicine | Admitting: Family Medicine

## 2018-03-09 ENCOUNTER — Encounter: Payer: Self-pay | Admitting: Vascular Surgery

## 2018-03-09 ENCOUNTER — Other Ambulatory Visit: Payer: Self-pay

## 2018-03-09 ENCOUNTER — Ambulatory Visit (INDEPENDENT_AMBULATORY_CARE_PROVIDER_SITE_OTHER): Payer: Medicare Other | Admitting: Vascular Surgery

## 2018-03-09 VITALS — BP 128/81 | HR 75 | Temp 97.0°F | Resp 16 | Ht 63.0 in | Wt 154.0 lb

## 2018-03-09 DIAGNOSIS — R600 Localized edema: Secondary | ICD-10-CM

## 2018-03-09 DIAGNOSIS — R609 Edema, unspecified: Secondary | ICD-10-CM | POA: Insufficient documentation

## 2018-03-09 NOTE — Progress Notes (Signed)
[facility]ist of [facility]  Patient name: Kayla Stokes MRN: [id number] DOB: May 30, 1945 Sex: female  REASON FOR CONSULT: Evaluation of lower extremity swelling left greater than right  HPI: Kayla Stokes is a 73 y.o. female, who is here today for discussion of lower extremity swelling left greater than right.  She has a past history left great saphenous vein ligation and stripping from her ankle to her knee with Dr. Kellie Simmering in 2004. sShe subsequently underwent left great saphenous vein laser ablation from her knee to her saphenofemoral junction with Dr. Kellie Simmering and 2013.  Did undergo left total knee replacement 2016.  She reports sensation and swelling in her left leg and is noted more swelling with prolonged standing.  Lesser degree in the left leg.  She has tried compression garments in the past but has had a difficult time wearing these due to sensation of restriction around her calf.  She has no history of DVT.  She has no history of arterial insufficiency  Past Medical History:  Diagnosis Date  . Allergic rhinitis   . Arthritis    finger  . Bursitis of right hip   . Complication of anesthesia    difficult to awaken after gallbladder surgery  . Dental crowns present   . Eczema    elbow  . GERD (gastroesophageal reflux disease)   . H. pylori infection   . History of hiatal hernia   . Hypertension    under control, has been on med. x 10 yrs.  . Mass of hand 05/2011   right  . Measles    hx of   . Mumps    hx of   . Osteopenia 11/2012   T score -1.1 left forearm FRAX 6.2%/0.1%  . Pancreatitis   . Rubella    hx of   . Trochanteric bursitis of left hip     Family History  Problem Relation Age of Onset  . Heart disease Brother   . Lung cancer Brother   . Diabetes Mother   . Hypertension Mother   . Heart disease Mother   . Colon cancer Mother 16  . Hypertension Father   . Cervical cancer Sister   . Throat cancer Brother     . Colon polyps Neg Hx     SOCIAL HISTORY: Social History   Socioeconomic History  . Marital status: Married    Spouse name: Not on file  . Number of children: Not on file  . Years of education: Not on file  . Highest education level: Not on file  Occupational History  . Not on file  Social Needs  . Financial resource strain: Not on file  . Food insecurity:    Worry: Not on file    Inability: Not on file  . Transportation needs:    Medical: Not on file    Non-medical: Not on file  Tobacco Use  . Smoking status: Never Smoker  . Smokeless tobacco: Never Used  Substance and Sexual Activity  . Alcohol use: Yes    Comment: occasionally-Rare  . Drug use: No  . Sexual activity: Yes    Partners: Male    Birth control/protection: Post-menopausal    Comment: intercourse age 38, more than 5 sexual partners, des neg  Lifestyle  . Physical activity:    Days per week: Not on file    Minutes per session: Not on file  . Stress: Not on file  Relationships  . Social connections:  Talks on phone: Not on file    Gets together: Not on file    Attends religious service: Not on file    Active member of club or organization: Not on file    Attends meetings of clubs or organizations: Not on file    Relationship status: Not on file  . Intimate partner violence:    Fear of current or ex partner: Not on file    Emotionally abused: Not on file    Physically abused: Not on file    Forced sexual activity: Not on file  Other Topics Concern  . Not on file  Social History Narrative  . Not on file    Allergies  Allergen Reactions  . Mobic [Meloxicam]   . Robaxin [Methocarbamol] Nausea And Vomiting    Dull headache.   . Nitrofurantoin Monohyd Macro Other (See Comments)    SEVERE STOMACH PAIN    Current Outpatient Medications  Medication Sig Dispense Refill  . ALPRAZolam (XANAX) 0.25 MG tablet Take 0.25 mg by mouth 3 (three) times daily as needed for anxiety. For anxiety.    .  fluticasone (FLONASE) 50 MCG/ACT nasal spray Place 1 spray into both nostrils daily as needed for allergies or rhinitis.    Marland Kitchen omeprazole (PRILOSEC) 40 MG capsule Take 40 mg by mouth daily as needed (acid reflux).   3  . Polyethyl Glycol-Propyl Glycol (SYSTANE OP) Apply 1 drop to eye 3 (three) times daily as needed (dry eyes).    . potassium gluconate 595 (99 K) MG TABS tablet Take 595 mg by mouth at bedtime.    . rosuvastatin (CRESTOR) 5 MG tablet Take 5 mg by mouth at bedtime.    . triamcinolone cream (KENALOG) 0.1 % Apply 1 application topically daily as needed (itching.).    Marland Kitchen triamterene-hydrochlorothiazide (MAXZIDE-25) 37.5-25 MG per tablet Take 1 tablet by mouth daily.  11   No current facility-administered medications for this visit.     REVIEW OF SYSTEMS:  [X]  denotes positive finding, [ ]  denotes negative finding Cardiac  Comments:  Chest pain or chest pressure:    Shortness of breath upon exertion:    Short of breath when lying flat:    Irregular heart rhythm:        Vascular    Pain in calf, thigh, or hip brought on by ambulation:    Pain in feet at night that wakes you up from your sleep:     Blood clot in your veins:    Leg swelling:  x       Pulmonary    Oxygen at home:    Productive cough:     Wheezing:         Neurologic    Sudden weakness in arms or legs:     Sudden numbness in arms or legs:     Sudden onset of difficulty speaking or slurred speech:    Temporary loss of vision in one eye:     Problems with dizziness:         Gastrointestinal    Blood in stool:     Vomited blood:         Genitourinary    Burning when urinating:     Blood in urine:        Psychiatric    Major depression:         Hematologic    Bleeding problems:    Problems with blood clotting too easily:        Skin  Rashes or ulcers:        Constitutional    Fever or chills:      PHYSICAL EXAM: Vitals:   03/09/18 1144  BP: 128/81  Pulse: 75  Resp: 16  Temp: (!) 97 F  (36.1 C)  TempSrc: Oral  SpO2: 100%  Weight: 154 lb (69.9 kg)  Height: 5\' 3"  (1.6 m)    GENERAL: The patient is a well-nourished female, in no acute distress. The vital signs are documented above. CARDIOVASCULAR: 2+ radial and 2+ dorsalis pedis pulses bilaterally.  Moderate edema more so from her left knee down onto her ankle.  This does not extend onto her foot or toes bilaterally.  Scattered telangiectasia in both lower extremities PULMONARY: There is good air exchange  ABDOMEN: Soft and non-tender  MUSCULOSKELETAL: There are no major deformities or cyanosis. NEUROLOGIC: No focal weakness or paresthesias are detected. SKIN: There are no ulcers or rashes noted. PSYCHIATRIC: The patient has a normal affect.  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  On the right she does have some reflux at the saphenofemoral junction and mild reflux in her right mall saphenous vein with no dilatation.  Does have some bilateral deep venous reflux  MEDICAL ISSUES: I discussed the significance of this with the patient in detail.  Explained that there is no correctable cause of her left leg swelling.  Explained that she has had a durable result from her saphenous vein treatment.  Reassured her that the swelling does not put her at any increased risk for more serious complications.  I have encouraged to continue elevation when possible and also consider a compression garments.  She reports that she like to try panty style compression.  Have given her information regarding elastic therapy as a possible source for these.  She will see [place] again as needed basis   Rosetta Posner, MD [facility] V[facility] and [facility] Specialists of [facility] Tel [phone] Pager [phone]

## 2018-08-10 DIAGNOSIS — R1013 Epigastric pain: Secondary | ICD-10-CM | POA: Insufficient documentation

## 2018-08-10 DIAGNOSIS — Z800 Family history of malignant neoplasm of digestive organs: Secondary | ICD-10-CM | POA: Insufficient documentation

## 2018-11-29 DIAGNOSIS — M545 Low back pain, unspecified: Secondary | ICD-10-CM | POA: Insufficient documentation

## 2018-12-01 ENCOUNTER — Encounter: Payer: Self-pay | Admitting: Gynecology

## 2018-12-27 ENCOUNTER — Other Ambulatory Visit: Payer: Self-pay

## 2018-12-28 ENCOUNTER — Ambulatory Visit (INDEPENDENT_AMBULATORY_CARE_PROVIDER_SITE_OTHER): Payer: Medicare Other | Admitting: Women's Health

## 2018-12-28 ENCOUNTER — Encounter: Payer: Self-pay | Admitting: Women's Health

## 2018-12-28 VITALS — BP 124/80 | Ht 63.0 in | Wt 166.0 lb

## 2018-12-28 DIAGNOSIS — Z01419 Encounter for gynecological examination (general) (routine) without abnormal findings: Secondary | ICD-10-CM

## 2018-12-28 NOTE — Patient Instructions (Signed)
Good to see you!  Health Maintenance After Age 71 After age 3, you are at a higher risk for certain long-term diseases and infections as well as injuries from falls. Falls are a major cause of broken bones and head injuries in people who are older than age 10. Getting regular preventive care can help to keep you healthy and well. Preventive care includes getting regular testing and making lifestyle changes as recommended by your health care provider. Talk with your health care provider about:  Which screenings and tests you should have. A screening is a test that checks for a disease when you have no symptoms.  A diet and exercise plan that is right for you. What should I know about screenings and tests to prevent falls? Screening and testing are the best ways to find a health problem early. Early diagnosis and treatment give you the best chance of managing medical conditions that are common after age 65. Certain conditions and lifestyle choices may make you more likely to have a fall. Your health care provider may recommend:  Regular vision checks. Poor vision and conditions such as cataracts can make you more likely to have a fall. If you wear glasses, make sure to get your prescription updated if your vision changes.  Medicine review. Work with your health care provider to regularly review all of the medicines you are taking, including over-the-counter medicines. Ask your health care provider about any side effects that may make you more likely to have a fall. Tell your health care provider if any medicines that you take make you feel dizzy or sleepy.  Osteoporosis screening. Osteoporosis is a condition that causes the bones to get weaker. This can make the bones weak and cause them to break more easily.  Blood pressure screening. Blood pressure changes and medicines to control blood pressure can make you feel dizzy.  Strength and balance checks. Your health care provider may recommend certain  tests to check your strength and balance while standing, walking, or changing positions.  Foot health exam. Foot pain and numbness, as well as not wearing proper footwear, can make you more likely to have a fall.  Depression screening. You may be more likely to have a fall if you have a fear of falling, feel emotionally low, or feel unable to do activities that you used to do.  Alcohol use screening. Using too much alcohol can affect your balance and may make you more likely to have a fall. What actions can I take to lower my risk of falls? General instructions  Talk with your health care provider about your risks for falling. Tell your health care provider if: ? You fall. Be sure to tell your health care provider about all falls, even ones that seem minor. ? You feel dizzy, sleepy, or off-balance.  Take over-the-counter and prescription medicines only as told by your health care provider. These include any supplements.  Eat a healthy diet and maintain a healthy weight. A healthy diet includes low-fat dairy products, low-fat (lean) meats, and fiber from whole grains, beans, and lots of fruits and vegetables. Home safety  Remove any tripping hazards, such as rugs, cords, and clutter.  Install safety equipment such as grab bars in bathrooms and safety rails on stairs.  Keep rooms and walkways well-lit. Activity   Follow a regular exercise program to stay fit. This will help you maintain your balance. Ask your health care provider what types of exercise are appropriate for you.  If  you need a cane or walker, use it as recommended by your health care provider.  Wear supportive shoes that have nonskid soles. Lifestyle  Do not drink alcohol if your health care provider tells you not to drink.  If you drink alcohol, limit how much you have: ? 0-1 drink a day for women. ? 0-2 drinks a day for men.  Be aware of how much alcohol is in your drink. In the [place] one drink equals one  typical bottle of beer (12 oz), one-half glass of wine (5 oz), or one shot of hard liquor (1 oz).  Do not use any products that contain nicotine or tobacco, such as cigarettes and e-cigarettes. If you need help quitting, ask your health care provider. Summary  Having a healthy lifestyle and getting preventive care can help to protect your health and wellness after age 66.  Screening and testing are the best way to find a health problem early and help you avoid having a fall. Early diagnosis and treatment give you the best chance for managing medical conditions that are more common for people who are older than age 8.  Falls are a major cause of broken bones and head injuries in people who are older than age 7. Take precautions to prevent a fall at home.  Work with your health care provider to learn what changes you can make to improve your health and wellness and to prevent falls. This information is not intended to replace advice given to you by your health care provider. Make sure you discuss any questions you have with your health care provider. Document Released: 12/24/2016 Document Revised: 06/03/2018 Document Reviewed: 12/24/2016 Elsevier Patient Education  2020 [employer].

## 2018-12-28 NOTE — Progress Notes (Signed)
Kayla Stokes 12/30/1945 HC:[id number]    History:    Presents for breast and pelvic exam. 1989 TVH with A&P repair.  Normal Pap and mammogram history.  2016 left knee replacement.  2020 small polyps 7-year follow-up.  Mother history of colon cancer.  Vaccines current.  2017 normal DEXA.  Primary care manages hypertension and hypercholesteremia.  Currently struggling with some low back pain has had improvement with PT.  Not sexually active husband's health.  Past medical history, past surgical history, family history and social history were all reviewed and documented in the EPIC chart.  Works out on a regular basis.  Is in a book club.  Retired Pharmacist, hospital.  3 daughters all doing well.  Has 5 sisters and 3 brothers.  ROS:  A ROS was performed and pertinent positives and negatives are included.  Exam:  Vitals:   12/28/18 0841  BP: 124/80  Weight: 166 lb (75.3 kg)  Height: 5\' 3"  (1.6 m)   Body mass index is 29.41 kg/m.   General appearance:  Normal Thyroid:  Symmetrical, normal in size, without palpable masses or nodularity. Respiratory  Auscultation:  Clear without wheezing or rhonchi Cardiovascular  Auscultation:  Regular rate, without rubs, murmurs or gallops  Edema/varicosities:  Not grossly evident Abdominal  Soft,nontender, without masses, guarding or rebound.  Liver/spleen:  No organomegaly noted  Hernia:  None appreciated  Skin  Inspection:  Grossly normal   Breasts: Examined lying and sitting.     Right: Without masses, retractions, discharge or axillary adenopathy.     Left: Without masses, retractions, discharge or axillary adenopathy. Gentitourinary   Inguinal/mons:  Normal without inguinal adenopathy  External genitalia:  Normal  BUS/Urethra/Skene's glands:  Normal  Vagina: +1 cystocele/asymptomatic   Cervix: And uterus absent   Adnexa/parametria:     Rt: Without masses or tenderness.   Lt: Without masses or tenderness.  Anus and perineum: Normal  Digital rectal  exam: Normal sphincter tone without palpated masses or tenderness  Assessment/Plan:  73 y.o. MWF G3 P3 for breast and pelvic exam with no complaints.  60 TVH with A&P repair +1 cystocele asymptomatic Hypertension,  Hypercholesterolemia, GERD, -primary care manages labs and meds Low back pain currently and PT with good relief   Plan: Back protective exercises reviewed, continue healthy lifestyle of regular exercise, yard work.  Vitamin D 2000 daily, home safety fall prevention discussed.  SBEs, continue annual screening mammogram, calcium rich foods encouraged.    [place], 9:35 AM 12/28/2018

## 2018-12-30 LAB — URINALYSIS, COMPLETE W/RFL CULTURE
Bacteria, UA: NONE SEEN /HPF
Bilirubin Urine: NEGATIVE
Glucose, UA: NEGATIVE
Hgb urine dipstick: NEGATIVE
Hyaline Cast: NONE SEEN /LPF
Ketones, ur: NEGATIVE
Leukocyte Esterase: NEGATIVE
Nitrites, Initial: NEGATIVE
Protein, ur: NEGATIVE
Specific Gravity, Urine: 1.017 (ref 1.001–1.03)
WBC, UA: NONE SEEN /HPF (ref 0–5)
pH: 7.5 (ref 5.0–8.0)

## 2018-12-30 LAB — URINE CULTURE
MICRO NUMBER:: 1059133
SPECIMEN QUALITY:: ADEQUATE

## 2018-12-30 LAB — CULTURE INDICATED

## 2019-02-25 HISTORY — PX: KNEE SURGERY: SHX244

## 2019-04-05 DIAGNOSIS — Z96651 Presence of right artificial knee joint: Secondary | ICD-10-CM | POA: Insufficient documentation

## 2019-09-12 DIAGNOSIS — M7062 Trochanteric bursitis, left hip: Secondary | ICD-10-CM | POA: Insufficient documentation

## 2019-11-04 DIAGNOSIS — M5416 Radiculopathy, lumbar region: Secondary | ICD-10-CM | POA: Insufficient documentation

## 2020-01-04 ENCOUNTER — Other Ambulatory Visit: Payer: Self-pay

## 2020-01-04 ENCOUNTER — Encounter: Payer: Self-pay | Admitting: Nurse Practitioner

## 2020-01-04 ENCOUNTER — Ambulatory Visit (INDEPENDENT_AMBULATORY_CARE_PROVIDER_SITE_OTHER): Payer: Medicare PPO | Admitting: Nurse Practitioner

## 2020-01-04 VITALS — BP 120/80 | Ht 63.0 in | Wt 166.0 lb

## 2020-01-04 DIAGNOSIS — Z01419 Encounter for gynecological examination (general) (routine) without abnormal findings: Secondary | ICD-10-CM | POA: Diagnosis not present

## 2020-01-04 DIAGNOSIS — Z9189 Other specified personal risk factors, not elsewhere classified: Secondary | ICD-10-CM | POA: Diagnosis not present

## 2020-01-04 DIAGNOSIS — N8110 Cystocele, unspecified: Secondary | ICD-10-CM

## 2020-01-04 DIAGNOSIS — Z90710 Acquired absence of both cervix and uterus: Secondary | ICD-10-CM

## 2020-01-04 NOTE — Patient Instructions (Signed)
Health Maintenance After Age 72 After age 72, you are at a higher risk for certain long-term diseases and infections as well as injuries from falls. Falls are a major cause of broken bones and head injuries in people who are older than age 72. Getting regular preventive care can help to keep you healthy and well. Preventive care includes getting regular testing and making lifestyle changes as recommended by your health care provider. Talk with your health care provider about:  Which screenings and tests you should have. A screening is a test that checks for a disease when you have no symptoms.  A diet and exercise plan that is right for you. What should I know about screenings and tests to prevent falls? Screening and testing are the best ways to find a health problem early. Early diagnosis and treatment give you the best chance of managing medical conditions that are common after age 72. Certain conditions and lifestyle choices may make you more likely to have a fall. Your health care provider may recommend:  Regular vision checks. Poor vision and conditions such as cataracts can make you more likely to have a fall. If you wear glasses, make sure to get your prescription updated if your vision changes.  Medicine review. Work with your health care provider to regularly review all of the medicines you are taking, including over-the-counter medicines. Ask your health care provider about any side effects that may make you more likely to have a fall. Tell your health care provider if any medicines that you take make you feel dizzy or sleepy.  Osteoporosis screening. Osteoporosis is a condition that causes the bones to get weaker. This can make the bones weak and cause them to break more easily.  Blood pressure screening. Blood pressure changes and medicines to control blood pressure can make you feel dizzy.  Strength and balance checks. Your health care provider may recommend certain tests to check your  strength and balance while standing, walking, or changing positions.  Foot health exam. Foot pain and numbness, as well as not wearing proper footwear, can make you more likely to have a fall.  Depression screening. You may be more likely to have a fall if you have a fear of falling, feel emotionally low, or feel unable to do activities that you used to do.  Alcohol use screening. Using too much alcohol can affect your balance and may make you more likely to have a fall. What actions can I take to lower my risk of falls? General instructions  Talk with your health care provider about your risks for falling. Tell your health care provider if: ? You fall. Be sure to tell your health care provider about all falls, even ones that seem minor. ? You feel dizzy, sleepy, or off-balance.  Take over-the-counter and prescription medicines only as told by your health care provider. These include any supplements.  Eat a healthy diet and maintain a healthy weight. A healthy diet includes low-fat dairy products, low-fat (lean) meats, and fiber from whole grains, beans, and lots of fruits and vegetables. Home safety  Remove any tripping hazards, such as rugs, cords, and clutter.  Install safety equipment such as grab bars in bathrooms and safety rails on stairs.  Keep rooms and walkways well-lit. Activity   Follow a regular exercise program to stay fit. This will help you maintain your balance. Ask your health care provider what types of exercise are appropriate for you.  If you need a cane or   walker, use it as recommended by your health care provider.  Wear supportive shoes that have nonskid soles. Lifestyle  Do not drink alcohol if your health care provider tells you not to drink.  If you drink alcohol, limit how much you have: ? 0-1 drink a day for women. ? 0-2 drinks a day for men.  Be aware of how much alcohol is in your drink. In the [place] one drink equals one typical bottle of beer (12  oz), one-half glass of wine (5 oz), or one shot of hard liquor (1 oz).  Do not use any products that contain nicotine or tobacco, such as cigarettes and e-cigarettes. If you need help quitting, ask your health care provider. Summary  Having a healthy lifestyle and getting preventive care can help to protect your health and wellness after age 72.  Screening and testing are the best way to find a health problem early and help you avoid having a fall. Early diagnosis and treatment give you the best chance for managing medical conditions that are more common for people who are older than age 72.  Falls are a major cause of broken bones and head injuries in people who are older than age 72. Take precautions to prevent a fall at home.  Work with your health care provider to learn what changes you can make to improve your health and wellness and to prevent falls. This information is not intended to replace advice given to you by your health care provider. Make sure you discuss any questions you have with your health care provider. Document Revised: 06/03/2018 Document Reviewed: 12/24/2016 Elsevier Patient Education  2020 [employer]  

## 2020-01-04 NOTE — Progress Notes (Signed)
   MODELLE VOLLMER 1946-02-17 [id number]   History:  74 y.o. G3P3003 presents for breast and pelvic exam without GYN complaints. 54 TVH with A & P repair. No HRT . Has some urinary leakage when she stands after urinating but says this has not worsened since it began a few years ago. Cystocele. Normal pap and mammogram history.  Mother with history of colon cancer.   Gynecologic History No LMP recorded. Patient has had a hysterectomy.   Last Pap: No longer screening per guidelines Last mammogram: 11/2019. Results were: Normal Last colonoscopy: 2020. Results were: polyps Last Dexa: 2017 . Results were: normal  Past medical history, past surgical history, family history and social history were all reviewed and documented in the EPIC chart.  ROS:  A ROS was performed and pertinent positives and negatives are included.  Exam:  Vitals:   01/04/20 1023  BP: 120/80  Weight: 166 lb (75.3 kg)  Height: 5\' 3"  (1.6 m)   Body mass index is 29.41 kg/m.  General appearance:  Normal Thyroid:  Symmetrical, normal in size, without palpable masses or nodularity. Respiratory  Auscultation:  Clear without wheezing or rhonchi Cardiovascular  Auscultation:  Regular rate, without rubs, murmurs or gallops  Edema/varicosities:  Not grossly evident Abdominal  Soft,nontender, without masses, guarding or rebound.  Liver/spleen:  No organomegaly noted  Hernia:  None appreciated  Skin  Inspection:  Grossly normal   Breasts: Examined lying and sitting.   Right: Without masses, retractions, discharge or axillary adenopathy.   Left: Without masses, retractions, discharge or axillary adenopathy. Gentitourinary   Inguinal/mons:  Normal without inguinal adenopathy  External genitalia:  Normal  BUS/Urethra/Skene's glands:  Normal  Vagina:  1+ cystocele  Cervix:  Absent  Uterus:  Absent  Adnexa/parametria:     Rt: Without masses or tenderness.   Lt: Without masses or tenderness.  Anus and  perineum: Normal  Digital rectal exam: Normal sphincter tone without palpated masses or tenderness  Assessment/Plan:  74 y.o. [id number] for breast and pelvic exam.   Well female exam with routine gynecological exam - Education provided on SBEs, importance of preventative screenings, current guidelines, high calcium diet, regular exercise, and multivitamin daily. Labs with PCP.    Female cystocele - 1+. Minimal symptoms. Has small amount of dribbling when she stands after urinating. Does Kegel exercises and has started back at the gym and works on pelvic floor strengthening.   History of total vaginal hysterectomy (TVH) - 1989 with A & P repair. No HRT.  Screening for osteoporosis - Normal Dexa in 2017 with 3-5 year repeat recommended. We will plan for this next year.   Screening for cervical cancer - Normal pap history. No longer screening per guidelines.  Screening for breast cancer - Normal mammogram history. Continue annual screenings. Normal breast exam today.   Screening for colon cancer - will repeat at GI's recommended interval. Mother with history of colon cancer.  Follow up in 1 year for annual.        Tamela Gammon [facility], 10:36 AM 01/04/2020

## 2020-03-08 DIAGNOSIS — M7541 Impingement syndrome of right shoulder: Secondary | ICD-10-CM | POA: Insufficient documentation

## 2020-06-14 ENCOUNTER — Ambulatory Visit (INDEPENDENT_AMBULATORY_CARE_PROVIDER_SITE_OTHER): Payer: Medicare PPO | Admitting: Podiatry

## 2020-06-14 ENCOUNTER — Ambulatory Visit (INDEPENDENT_AMBULATORY_CARE_PROVIDER_SITE_OTHER): Payer: Medicare PPO

## 2020-06-14 ENCOUNTER — Other Ambulatory Visit: Payer: Self-pay | Admitting: Podiatry

## 2020-06-14 ENCOUNTER — Other Ambulatory Visit: Payer: Self-pay

## 2020-06-14 ENCOUNTER — Encounter: Payer: Self-pay | Admitting: Podiatry

## 2020-06-14 DIAGNOSIS — M7752 Other enthesopathy of left foot: Secondary | ICD-10-CM

## 2020-06-14 DIAGNOSIS — M778 Other enthesopathies, not elsewhere classified: Secondary | ICD-10-CM

## 2020-06-14 MED ORDER — TRIAMCINOLONE ACETONIDE 40 MG/ML IJ SUSP
20.0000 mg | Freq: Once | INTRAMUSCULAR | Status: AC
  Administered 2020-06-14: 20 mg

## 2020-06-16 NOTE — Progress Notes (Signed)
She presents today chief complaint of pain to the dorsal lateral aspect of her midfoot left.  She states that is been aching times the past several months.  It radiates down the lateral side of her foot denies any injuries but does relate a twisting injury years ago.  Objective: I reviewed her past medical history medications allergies surgery social history and review of systems.  Pulses are palpable.  She has pain on palpation of his subtalar joint left foot.  Assessment: Subtalar joint capsulitis mild osteoarthritis midfoot.  Plan: I injected her today after Betadine skin prep Kenalog and local anesthetic subtalar joint left foot tolerated procedure well.

## 2021-01-07 ENCOUNTER — Encounter: Payer: Self-pay | Admitting: Nurse Practitioner

## 2021-01-07 ENCOUNTER — Telehealth: Payer: Self-pay

## 2021-01-07 ENCOUNTER — Other Ambulatory Visit: Payer: Self-pay

## 2021-01-07 ENCOUNTER — Ambulatory Visit (INDEPENDENT_AMBULATORY_CARE_PROVIDER_SITE_OTHER): Payer: Medicare PPO | Admitting: Nurse Practitioner

## 2021-01-07 VITALS — BP 122/76 | Ht 63.0 in | Wt 170.0 lb

## 2021-01-07 DIAGNOSIS — R102 Pelvic and perineal pain: Secondary | ICD-10-CM

## 2021-01-07 DIAGNOSIS — N8110 Cystocele, unspecified: Secondary | ICD-10-CM

## 2021-01-07 DIAGNOSIS — N393 Stress incontinence (female) (male): Secondary | ICD-10-CM

## 2021-01-07 DIAGNOSIS — Z01419 Encounter for gynecological examination (general) (routine) without abnormal findings: Secondary | ICD-10-CM | POA: Diagnosis not present

## 2021-01-07 DIAGNOSIS — K5900 Constipation, unspecified: Secondary | ICD-10-CM

## 2021-01-07 DIAGNOSIS — Z9189 Other specified personal risk factors, not elsewhere classified: Secondary | ICD-10-CM

## 2021-01-07 DIAGNOSIS — Z780 Asymptomatic menopausal state: Secondary | ICD-10-CM

## 2021-01-07 DIAGNOSIS — N816 Rectocele: Secondary | ICD-10-CM

## 2021-01-07 NOTE — Telephone Encounter (Signed)
Tamela Gammon, NP  [facility] Triage  Please send referral for pelvic floor PT for stress incontinence, difficulty defecating, cystocele with rectocele, and pelvic pain. Thank you.

## 2021-01-07 NOTE — Telephone Encounter (Signed)
Referral order placed.

## 2021-01-07 NOTE — Progress Notes (Signed)
Kayla Stokes 1945/09/17 [id number]  History:  75 y.o. G3P3003 presents for breast and pelvic exam. Postmenopausal - no HRT. S/P 1989 TVH with A & P repair.  Has had stress incontinence for a few years but feels it is worsening and she notices pelvic pressure/pulling after being on her feet/carrying grandson. She also has difficulty passing stool and feels it gets stuck near rectum. Sometimes she will place finger in vagina to put pressure on stool to help with elimination.  Normal pap and mammogram history.    Gynecologic History No LMP recorded. Patient has had a hysterectomy.   Contraception/Family planning: status post hysterectomy  Health Maintenance Last Pap: 2012. Results were: Normal Last mammogram: 11/23/2020. Results were: Normal Last colonoscopy: 2020. Results were: polyps, 5-7 years Last Dexa: 2017. Results were: Normal  Past medical history, past surgical history, family history and social history were all reviewed and documented in the EPIC chart. Married. Retired Pharmacist, hospital. 3 children, 28 yo granddaughter and 63 yo grandson. Mother with history of colon cancer at age 73.   ROS:  A ROS was performed and pertinent positives and negatives are included.  Exam:  Vitals:   01/07/21 1009  BP: 122/76  Weight: 170 lb (77.1 kg)  Height: 5\' 3"  (1.6 m)    Body mass index is 30.11 kg/m.  General appearance:  Normal Thyroid:  Symmetrical, normal in size, without palpable masses or nodularity. Respiratory  Auscultation:  Clear without wheezing or rhonchi Cardiovascular  Auscultation:  Regular rate, without rubs, murmurs or gallops  Edema/varicosities:  Not grossly evident Abdominal  Soft,nontender, without masses, guarding or rebound.  Liver/spleen:  No organomegaly noted  Hernia:  None appreciated  Skin  Inspection:  Grossly normal   Breasts: Examined lying and sitting.   Right: Without masses, retractions, discharge or axillary adenopathy.   Left: Without masses,  retractions, discharge or axillary adenopathy. Genitourinary   Inguinal/mons:  Normal without inguinal adenopathy  External genitalia:  Normal appearing vulva with no masses, tenderness, or lesions  BUS/Urethra/Skene's glands:  Normal  Vagina:  1+ cystocele and rectocele. Atrophic changes  Cervix:  Absent  Uterus:  Absent  Adnexa/parametria:     Rt: Normal in size, without masses or tenderness.   Lt: Normal in size, without masses or tenderness.  Anus and perineum: Normal  Digital rectal exam: Weak sphincter tone without palpated masses or tenderness  Patient informed chaperone available to be present for breast and pelvic exam. Patient has requested no chaperone to be present. Patient has been advised what will be completed during breast and pelvic exam.   Assessment/Plan:  75 y.o. G3P3003 for breast and pelvic exam.   Well female exam with routine gynecological exam - Education provided on SBEs, importance of preventative screenings, current guidelines, high calcium diet, regular exercise, and multivitamin daily. Labs with PCP.    Postmenopausal - Plan: DG Bone Density. No HRT.   Stress incontinence - Has had this for a few years but feels it is worsening. See HPI. Will refer to pelvic floor PT.   Cystocele with rectocele - grade 1, symptomatic. Will send referral for pelvic floor PT.   Difficulty defecating - Sometimes she will place finger in vagina to put pressure on stool to help with elimination. Pelvic floor PT.   Pelvic pressure in female - She has pelvic pressure/pulling after being on her feet/carrying grandson. Pelvic floor PT.   Screening for osteoporosis - Normal DXA in 2017 with 3-5 year repeat recommended. Will schedule DXA  now.   Screening for cervical cancer - Normal pap history. No longer screening per guidelines.  Screening for breast cancer - Normal mammogram history. Continue annual screenings. Normal breast exam today.   Screening for colon cancer - 2020  colonoscopy. will repeat at GI's recommended interval. Mother with history of colon cancer.  Follow up in 1 year for annual.        Tamela Gammon [facility], 10:49 AM 01/07/2021

## 2021-01-14 ENCOUNTER — Other Ambulatory Visit: Payer: Self-pay

## 2021-01-14 ENCOUNTER — Ambulatory Visit: Payer: Medicare PPO | Attending: Nurse Practitioner | Admitting: Physical Therapy

## 2021-01-14 ENCOUNTER — Encounter: Payer: Self-pay | Admitting: Physical Therapy

## 2021-01-14 DIAGNOSIS — N8110 Cystocele, unspecified: Secondary | ICD-10-CM | POA: Diagnosis not present

## 2021-01-14 DIAGNOSIS — R279 Unspecified lack of coordination: Secondary | ICD-10-CM | POA: Insufficient documentation

## 2021-01-14 DIAGNOSIS — N816 Rectocele: Secondary | ICD-10-CM | POA: Insufficient documentation

## 2021-01-14 DIAGNOSIS — N393 Stress incontinence (female) (male): Secondary | ICD-10-CM | POA: Diagnosis not present

## 2021-01-14 DIAGNOSIS — R102 Pelvic and perineal pain: Secondary | ICD-10-CM | POA: Diagnosis not present

## 2021-01-14 DIAGNOSIS — M6281 Muscle weakness (generalized): Secondary | ICD-10-CM | POA: Insufficient documentation

## 2021-01-14 DIAGNOSIS — R293 Abnormal posture: Secondary | ICD-10-CM | POA: Insufficient documentation

## 2021-01-14 NOTE — Patient Instructions (Addendum)
Urge Incontinence  Ideal urination frequency is every 2-4 wakeful hours, which equates to 5-8 times within a 24-hour period.   Urge incontinence is leakage that occurs when the bladder muscle contracts, creating a sudden need to go before getting to the bathroom.   Going too often when your bladder isn't actually full can disrupt the body's automatic signals to store and hold urine longer, which will increase urgency/frequency.  In this case, the bladder "is running the show" and strategies can be learned to retrain this pattern.   One should be able to control the first urge to urinate, at around 17mL.  The bladder can hold up to a "grande latte," or 447mL. To help you gain control, practice the Urge Drill below when urgency strikes.  This drill will help retrain your bladder signals and allow you to store and hold urine longer.  The overall goal is to stretch out your time between voids to reach a more manageable voiding schedule.    Practice your "quick flicks" often throughout the day (each waking hour) even when you don't need feel the urge to go.  This will help strengthen your pelvic floor muscles, making them more effective in controlling leakage.  Urge Drill  When you feel an urge to go, follow these steps to regain control: Stop what you are doing and be still Take one deep breath, directing your air into your abdomen Think an affirming thought, such as "I've got this." Do 5 quick flicks of your pelvic floor Walk with control to the bathroom to void, or delay voiding   Relaxation Exercises with the Urge to Void   When you experience an urge to void:  FIRST  Stop and stand very still    Sit down if you can    Don't move    You need to stay very still to maintain control  SECOND Squeeze your pelvic floor muscles 5 times, like a quick flick, to keep from leaking  THIRD Relax  Take a deep breath and then let it out  Try to make the urge go away by using relaxation and  visualization techniques  FINALLY When you feel the urge go away somewhat, walk normally to the bathroom.   If the urge gets suddenly stronger on the way, you may stop again and relax to regain control.    Bladder Irritants  Certain foods and beverages can be irritating to the bladder.  Avoiding these irritants may decrease your symptoms of urinary urgency, frequency or bladder pain.  Even reducing your intake can help with your symptoms.  Not everyone is sensitive to all bladder irritants, so you may consider focusing on one irritant at a time, removing or reducing your intake of that irritant for 7-10 days to see if this change helps your symptoms.  Water intake is also very important.  Below is a list of bladder irritants.  Drinks: alcohol, carbonated beverages, caffeinated beverages such as coffee and tea, drinks with artificial sweeteners, citrus juices, apple juice, tomato juice  Foods: tomatoes and tomato based foods, spicy food, sugar and artificial sweeteners, vinegar, chocolate, raw onion, apples, citrus fruits, pineapple, cranberries, tomatoes, strawberries, plums, peaches, cantaloupe  Other: acidic urine (too concentrated) - see water intake info below  Substitutes you can try that are NOT irritating to the bladder: cooked onion, pears, papayas, sun-brewed decaf teas, watermelons, non-citrus herbal teas, apricots, kava and low-acid instant drinks (Postum).    WATER INTAKE: Remember to drink lots of water (aim  for fluid intake of half your body weight with 2/3 of fluids being water).  You may be limiting fluids due to fear of leakage, but this can actually worsen urgency symptoms due to highly concentrated urine.  Water helps balance the pH of your urine so it doesn't become too acidic - acidic urine is a bladder irritant!    Pelvic Floor Strengthening:  Do 3x10 Kegels throughout the day  Do 2T09 Quick Flicks throughout the day (contract and relax quickly back to back) Do  3x10 contractions with a 5 second hold each time *You can put a hand on your abdomen and one on your bottom when doing these. There shouldn't be large movements here when trying to do Kegels. Kegels should be isolated to the pelvic floor *Do one round of 10 at morning, lunch, and evening.

## 2021-01-14 NOTE — Therapy (Signed)
[facility] @ [address] [place], [place], [address] Phone: [phone]   Fax:  [phone]  Physical Therapy Evaluation  Patient Details  Name: Kayla Stokes MRN: [id number] Date of Birth: April 07, 1945 Referring Provider (PT): Tamela Gammon, NP   Encounter Date: 01/14/2021   PT End of Session - 01/14/21 1106     Visit Number 1    Date for PT Re-Evaluation 05/14/21    PT Start Time 2263    PT Stop Time 1100    PT Time Calculation (min) 45 min    Activity Tolerance Patient tolerated treatment well    Behavior During Therapy [facility] for tasks assessed/performed             Past Medical History:  Diagnosis Date   Allergic rhinitis    Arthritis    finger   Bursitis of right hip    Complication of anesthesia    difficult to awaken after gallbladder surgery   Dental crowns present    Eczema    elbow   GERD (gastroesophageal reflux disease)    H. pylori infection    History of hiatal hernia    Hypertension    under control, has been on med. x 10 yrs.   Mass of hand 05/2011   right   Measles    hx of    Mumps    hx of    Osteopenia 11/2012   T score -1.1 left forearm FRAX 6.2%/0.1%   Pancreatitis    Rubella    hx of    Trochanteric bursitis of left hip     Past Surgical History:  Procedure Laterality Date   ABDOMINAL HYSTERECTOMY     ABLATION SAPHENOUS VEIN W/ RFA  10-2011   CATARACT EXTRACTION W/ INTRAOCULAR LENS  IMPLANT, BILATERAL  09/2010, 01/2011   bilateral   CHOLECYSTECTOMY  MAY 2014   CHONDROPLASTY  02/12/2011   Procedure: CHONDROPLASTY;  Surgeon: Gearlean Alf;  Location: [place];  Service: Orthopedics;  Laterality: Left;   EYE SURGERY     laser right eye; due to right eye hemorrhage; had left eye retinal tear with subsequent surgery   KNEE ARTHROSCOPY  02/12/2011   Procedure: ARTHROSCOPY KNEE;  Surgeon: Gearlean Alf;  Location: [place];  Service:  Orthopedics;  Laterality: Left;   KNEE SURGERY  2021   MASS EXCISION  06/05/2011   Procedure: EXCISION MASS;  Surgeon: Linna Hoff, MD;  Location: [place];  Service: Orthopedics;  Laterality: Right;  right thumb   MENISCUS DEBRIDEMENT  02/12/2011   Procedure: DEBRIDEMENT OF MENISCUS;  Surgeon: Gearlean Alf;  Location: [place];  Service: Orthopedics;  Laterality: Left;   MOUTH SURGERY     implants    stent placement after cholecestectomy     TOTAL KNEE ARTHROPLASTY Left 05/22/2014   Procedure: LEFT TOTAL KNEE ARTHROPLASTY;  Surgeon: Gaynelle Arabian, MD;  Location: [facility];  Service: Orthopedics;  Laterality: Left;   TUBAL LIGATION     VAGINAL HYSTERECTOMY WITH A&P REPAIR  1989   VARICOSE VEIN SURGERY  02/13/2003   ligation and stripping left greater saphenous; exc.  multiple varicosities    There were no vitals filed for this visit.    Subjective Assessment - 01/14/21 1019     Subjective Pt reports in the past 12 months she has had a worsening of pelvic pain at anterior pelvis, difficulty with bowel movements with  straining and sometimes needs to insert finger into vagina to assist in passing stool, usually Type 4 but with less water intake notices type 1-2, intermittent pain with BMs but not bleeding. Pt does take probiotic daily to assist. Pt reports she sometimes leak loose stools though rarely. Pt also leaks urine daily liners worn 1-2x and at night, with stressors and urge. Sometimes at end of day and with lifting will cause feeling of falling out.    Pertinent History TVH with A & P repair 1989,    How long can you sit comfortably? no limits    How long can you stand comfortably? no limits    How long can you walk comfortably? no limits    Currently in Pain? No/denies                [facility] PT Assessment - 01/14/21 0001       Assessment   Medical Diagnosis N39.3 (ICD-10-CM) - Stress incontinence  N81.10,N81.6 (ICD-10-CM) - Cystocele  with rectocele  R10.2 (ICD-10-CM) - Pelvic pain    Referring Provider (PT) Tamela Gammon, NP    Onset Date/Surgical Date --   over the passed 12 months   Prior Therapy yes bil knee replacements, back, and rt shoulder      Precautions   Precautions None      Restrictions   Weight Bearing Restrictions No      Balance Screen   Has the patient fallen in the past 6 months No    Has the patient had a decrease in activity level because of a fear of falling?  No    Is the patient reluctant to leave their home because of a fear of falling?  No      Home Ecologist residence    Living Arrangements Spouse/significant other      Prior Function   Level of Independence Independent      Cognition   Overall Cognitive Status Within Functional Limits for tasks assessed      Sensation   Light Touch Appears Intact      Coordination   Gross Motor Movements are Fluid and Coordinated Yes    Fine Motor Movements are Fluid and Coordinated Yes      Posture/Postural Control   Posture/Postural Control Postural limitations    Postural Limitations Rounded Shoulders;Posterior pelvic tilt      ROM / Strength   AROM / PROM / Strength AROM;Strength      AROM   Overall AROM Comments thoracic and lumbar spine limted by 75% in side bending and roation, 50% flexion and extension      Strength   Overall Strength Comments bil hip abduction 3+/5, flexion and adduction 4/5 and extension 3+5      Flexibility   Soft Tissue Assessment /Muscle Length yes   bil adductor and hamstrings limited by 50%     Palpation   Palpation comment "sore" throughout upper quadrants of abdomen, and at bladder with decreased mobility at bladder noted                 No emotional/communication barriers or cognitive limitation. Patient is motivated to learn. Patient understands and agrees with treatment goals and plan. PT explains patient will be examined in standing, sitting, and lying  down to see how their muscles and joints work. When they are ready, they will be asked to remove their underwear so PT can examine their perineum. The patient is also given the  option of providing their own chaperone as one is not provided in our facility. The patient also has the right and is explained the right to defer or refuse any part of the evaluation or treatment including the internal exam. With the patient's consent, PT will use one gloved finger to gently assess the muscles of the pelvic floor, seeing how well it contracts and relaxes and if there is muscle symmetry. After, the patient will get dressed and PT and patient will discuss exam findings and plan of care. PT and patient discuss plan of care, schedule, attendance policy and HEP activities.        Objective measurements completed on examination: See above findings.     Pelvic Floor Special Questions - 01/14/21 0001     Prior Pelvic/Prostate Exam No   2 years ago- normal   Are you Pregnant or attempting pregnancy? No    Prior Pregnancies Yes    Number of Pregnancies 3    Number of Vaginal Deliveries 3   tearing with first into rectum   Any difficulty with labor and deliveries Yes    Episiotomy Performed No    Currently Sexually Active No   not for 3 years   History of sexually transmitted disease No    Marinoff Scale no problems    Urinary Leakage Yes    How often daily- a few times    Pad use 2x day liners    Activities that cause leaking With strong urge;Coughing;Sneezing;Laughing;Lifting;Walking;Exercising    Urinary urgency Yes    Urinary frequency urinates every 4 hours    Fecal incontinence Yes   somtimes with looser stools but rare   Fluid intake drinks ~4-6 galsses    Caffeine beverages yes-2-3 cups daily    Falling out feeling (prolapse) Yes    Activities that cause feeling of prolapse lifting or at end of day    External Perineal Exam [facility]    Skin Integrity Erthema   at vulva   External Palpation no TTP     Prolapse Anterior Wall   grade 1   Pelvic Floor Internal Exam patient identified and patient confirms consent for PT to perform internal soft tissue work and muscle strength and integrity assessment    Exam Type Vaginal    Sensation WFL    Palpation mild TTP at Lt pubococcygeus and puborectalis.    Strength Flicker    Strength # of reps 4    Strength # of seconds 4    Tone decreased                       PT Education - 01/14/21 1105     Education Details Pt educated on exam findings, POC, HEP and handouts for bladder irritants, urge drill, squatty potty use.    Person(s) Educated Patient    Methods Explanation;Demonstration;Tactile cues;Verbal cues;Handout    Comprehension Returned demonstration              PT Short Term Goals - 01/14/21 1225       PT SHORT TERM GOAL #1   Title Pt to be I with HEP    Time 5    Period Weeks    Status New    Target Date 02/18/21      PT SHORT TERM GOAL #2   Title pt to demonstrate at least 3/5 pelvic floor strength for decreased leakage symptoms.    Time 5    Period Weeks  Target Date 02/18/21      PT SHORT TERM GOAL #3   Title pt to report no more than one urinary leak per day to improve QOL and skin integrity at vulva.    Time 5    Period Weeks    Status New    Target Date 02/18/21      PT SHORT TERM GOAL #4   Title pt to recall and demonsrate good recall of voiding mechanics to decrease straining at pelvic floor and prolapse.    Time 5    Period Weeks    Status New    Target Date 02/18/21               PT Long Term Goals - 01/14/21 1227       PT LONG TERM GOAL #1   Title Pt to be I with advanced HEP    Time 4    Period Months    Status New    Target Date 05/14/21      PT LONG TERM GOAL #2   Title pt to demonstrate at least 4/5 pelvic floor strength for decreased leakage symptoms    Time 4    Period Months    Status New    Target Date 05/14/21      PT LONG TERM GOAL #3   Title pt to  report no more than one urinary leak per week to improve QOL and skin integrity at vulva.    Time 4    Period Months    Status New    Target Date 05/14/21      PT LONG TERM GOAL #4   Title pt to demonstrate improved bil hip stength to 5/5 throughout to improve pelvic stability    Time 4    Period Months    Status New    Target Date 05/14/21                    Plan - 01/14/21 1107     Clinical Impression Statement Pt is 75yo female presenting with worsening of pelvic floor symptoms in the last 12 months. Pt has noticed increased urgency with urine, urinary leakages multiple times daily wearing 1-2x panty liners daily with leaks during stressors and strong urge. Pt also has had fecal leaks with loose stool very rarely but has happened per pt, also reports symptoms of prolapse with lifting and at end of day. Pt demonstrated decreased mobility in spine in all directions, decreased mobility at bil hips and decreased strength at bil hips, mild TTP at upper quadrants of abdomen and at bladder with decreased mobility at bladder. Pt consented to internal vaginal assessment of pelvic floor this date, found to have 1/5 strength throughout, decreased endurance and coordination as well. Pt required extra time and cues to complete pelvic floor contraction as initially demonstrated compensatory strategies at core,glutes and thighs. Pt able to isolate pelvic floor for contraction however great difficulty without holding breath. Pt given handouts and educated on urge drill, bladder irritants, kegel HEP and squatty potty. Pt denied additional questions and agreeable and motivated to complete POC. Pt would benefit from additional PT to further address deficits found at eval.    Personal Factors and Comorbidities Comorbidity 1;Fitness    Comorbidities x3 vaginal births, one tearing (with first) into EAS per pt    Examination-Activity Limitations Continence;Hygiene/Grooming;Squat;Caring for  Others;Transfers;Lift    Examination-Participation Restrictions Community Activity;Driving;Interpersonal Relationship;Shop    Stability/Clinical Decision Making Evolving/Moderate complexity  Clinical Decision Making Moderate    Rehab Potential Good    PT Frequency 1x / week    PT Duration Other (comment)   10 weeks   PT Treatment/Interventions ADLs/Self Care Home Management;Aquatic Therapy;Functional mobility training;Therapeutic activities;Therapeutic exercise;Neuromuscular re-education;Manual techniques;Patient/family education;Scar mobilization;Passive range of motion;Energy conservation;Taping    PT Next Visit Plan go over all handouts, breathing mechanics, prolapse pressure management    Consulted and Agree with Plan of Care Patient             Patient will benefit from skilled therapeutic intervention in order to improve the following deficits and impairments:  Decreased endurance, Decreased coordination, Improper body mechanics, Impaired flexibility, Decreased scar mobility, Decreased mobility, Decreased strength, Postural dysfunction  Visit Diagnosis: Abnormal posture - Plan: PT plan of care cert/re-cert  Muscle weakness (generalized) - Plan: PT plan of care cert/re-cert  Lack of coordination - Plan: PT plan of care cert/re-cert     Problem List Patient Active Problem List   Diagnosis Date Noted   Impingement syndrome of right shoulder region 03/08/2020   Lumbar radiculopathy 11/04/2019   Trochanteric bursitis of left hip 09/12/2019   History of prosthetic unicompartmental arthroplasty of right knee 04/05/2019   Low back pain 11/29/2018   Family history of gastric cancer 08/10/2018   Epigastric abdominal pain 08/10/2018   Pain in right knee 07/23/2017   Noise effect on both inner ears 07/22/2017   Presbycusis of both ears 07/22/2017   Stage 3 chronic kidney disease ([place]) 06/29/2017   Diverticulitis 11/04/2016   Benign essential hypertension 11/17/2015   OA  (osteoarthritis) of knee 05/22/2014   Colon polyps 06/21/2012   Diverticulosis 06/21/2012   Fluid retention 06/21/2012   History of pancreatitis 05/17/2012   H. pylori infection    Varicose veins of lower extremities with other complications 82/64/1583   GASTRITIS 02/07/2010   Hyperlipidemia 01/16/2010   Anxiety state 01/16/2010   Allergic rhinitis 01/16/2010   DIVERTICULAR DISEASE 01/16/2010   Constipation 01/16/2010   NAUSEA AND VOMITING 01/16/2010   PERSONAL HX COLONIC POLYPS 01/16/2010    Stacy Gardner, PT, DPT 01/15/2211:29 PM   [facility] @ [address] [place], [place], [address] Phone: [phone]   Fax:  [phone]  Name: Kayla Stokes MRN: [id number] Date of Birth: 1945-10-02

## 2021-01-15 ENCOUNTER — Ambulatory Visit (INDEPENDENT_AMBULATORY_CARE_PROVIDER_SITE_OTHER): Payer: Medicare PPO

## 2021-01-15 ENCOUNTER — Other Ambulatory Visit: Payer: Self-pay | Admitting: Nurse Practitioner

## 2021-01-15 DIAGNOSIS — Z780 Asymptomatic menopausal state: Secondary | ICD-10-CM

## 2021-01-15 DIAGNOSIS — Z13820 Encounter for screening for osteoporosis: Secondary | ICD-10-CM

## 2021-02-13 ENCOUNTER — Other Ambulatory Visit: Payer: Self-pay

## 2021-02-13 ENCOUNTER — Ambulatory Visit: Payer: Medicare PPO | Attending: Nurse Practitioner | Admitting: Physical Therapy

## 2021-02-13 DIAGNOSIS — R279 Unspecified lack of coordination: Secondary | ICD-10-CM | POA: Diagnosis present

## 2021-02-13 DIAGNOSIS — M6281 Muscle weakness (generalized): Secondary | ICD-10-CM | POA: Diagnosis present

## 2021-02-13 DIAGNOSIS — R293 Abnormal posture: Secondary | ICD-10-CM | POA: Insufficient documentation

## 2021-02-13 NOTE — Therapy (Signed)
[facility] @ [address] [place], [place], [address] Phone: [phone]   Fax:  [phone]  Physical Therapy Treatment  Patient Details  Name: Kayla Stokes MRN: [id number] Date of Birth: [birth year] Referring Provider (Kayla Stokes): Tamela Gammon, NP   Encounter Date: 02/13/2021   Kayla Stokes End of Session - 02/13/21 1006     Visit Number 2    Date for Kayla Stokes Re-Evaluation 05/14/21    Authorization Type [employer]    Kayla Stokes Start Time 0933    Kayla Stokes Stop Time 1012    Kayla Stokes Time Calculation (min) 39 min    Activity Tolerance Patient tolerated treatment well    Behavior During Therapy [facility] for tasks assessed/performed             Past Medical History:  Diagnosis Date   Allergic rhinitis    Arthritis    finger   Bursitis of right hip    Complication of anesthesia    difficult to awaken after gallbladder surgery   Dental crowns present    Eczema    elbow   GERD (gastroesophageal reflux disease)    H. pylori infection    History of hiatal hernia    Hypertension    under control, has been on med. x 10 yrs.   Mass of hand 05/2011   right   Measles    hx of    Mumps    hx of    Osteopenia 11/2012   T score -1.1 left forearm FRAX 6.2%/0.1%   Pancreatitis    Rubella    hx of    Trochanteric bursitis of left hip     Past Surgical History:  Procedure Laterality Date   ABDOMINAL HYSTERECTOMY     ABLATION SAPHENOUS VEIN W/ RFA  10-2011   CATARACT EXTRACTION W/ INTRAOCULAR LENS  IMPLANT, BILATERAL  09/2010, 01/2011   bilateral   CHOLECYSTECTOMY  MAY 2014   CHONDROPLASTY  02/12/2011   Procedure: CHONDROPLASTY;  Surgeon: Gearlean Alf;  Location: [place];  Service: Orthopedics;  Laterality: Left;   EYE SURGERY     laser right eye; due to right eye hemorrhage; had left eye retinal tear with subsequent surgery   KNEE ARTHROSCOPY  02/12/2011   Procedure: ARTHROSCOPY KNEE;  Surgeon: Gearlean Alf;  Location:  [place];  Service: Orthopedics;  Laterality: Left;   KNEE SURGERY  2021   MASS EXCISION  06/05/2011   Procedure: EXCISION MASS;  Surgeon: Linna Hoff, MD;  Location: [place];  Service: Orthopedics;  Laterality: Right;  right thumb   MENISCUS DEBRIDEMENT  02/12/2011   Procedure: DEBRIDEMENT OF MENISCUS;  Surgeon: Gearlean Alf;  Location: [place];  Service: Orthopedics;  Laterality: Left;   MOUTH SURGERY     implants    stent placement after cholecestectomy     TOTAL KNEE ARTHROPLASTY Left 05/22/2014   Procedure: LEFT TOTAL KNEE ARTHROPLASTY;  Surgeon: Gaynelle Arabian, MD;  Location: [facility];  Service: Orthopedics;  Laterality: Left;   TUBAL LIGATION     VAGINAL HYSTERECTOMY WITH A&P REPAIR  1989   VARICOSE VEIN SURGERY  02/13/2003   ligation and stripping left greater saphenous; exc.  multiple varicosities    There were no vitals filed for this visit.   Subjective Assessment - 02/13/21 0936     Subjective Kayla Stokes reports she has been trying to complete urge drill regularly, squatty potty has really helped with  straining for BM no longer feels the need to do that. Kayla Stokes reports no longer feeling pain since not having to strain.    Pertinent History TVH with A & P repair 1989,    How long can you sit comfortably? no limits    How long can you stand comfortably? no limits    How long can you walk comfortably? no limits    Currently in Pain? No/denies                               [facility] Adult Kayla Stokes Treatment/Exercise - 02/13/21 0001       Self-Care   Self-Care Other Self-Care Comments    Other Self-Care Comments  Kayla Stokes educated on urge drill again for carry over, bladder retraining, and HEP      Exercises   Exercises Lumbar;Knee/Hip      Lumbar Exercises: Standing   Functional Squats 20 reps    Functional Squats Limitations 10# kettlebell    Other Standing Lumbar Exercises mario punches x10 2x3# DB    Other Standing  Lumbar Exercises palloffs green band 2x10      Lumbar Exercises: Supine   Clam 20 reps    Clam Limitations red loop    Bridge 20 reps    Other Supine Lumbar Exercises hip flexion red loop 2x10    Other Supine Lumbar Exercises same arm/knee press isometric 3s 2x10                     Kayla Stokes Education - 02/13/21 1004     Education Details Kayla Stokes educated on HEP, breathing and pelvic floor coordination with all exercises.    Person(s) Educated Patient    Methods Explanation;Demonstration;Tactile cues;Verbal cues;Handout    Comprehension Returned demonstration;Verbalized understanding              Kayla Stokes Short Term Goals - 01/14/21 1225       Kayla Stokes SHORT TERM GOAL #1   Title Kayla Stokes to be I with HEP    Time 5    Period Weeks    Status New    Target Date 02/18/21      Kayla Stokes SHORT TERM GOAL #2   Title Kayla Stokes to demonstrate at least 3/5 pelvic floor strength for decreased leakage symptoms.    Time 5    Period Weeks    Target Date 02/18/21      Kayla Stokes SHORT TERM GOAL #3   Title Kayla Stokes to report no more than one urinary leak per day to improve QOL and skin integrity at vulva.    Time 5    Period Weeks    Status New    Target Date 02/18/21      Kayla Stokes SHORT TERM GOAL #4   Title Kayla Stokes to recall and demonsrate good recall of voiding mechanics to decrease straining at pelvic floor and prolapse.    Time 5    Period Weeks    Status New    Target Date 02/18/21               Kayla Stokes Long Term Goals - 01/14/21 1227       Kayla Stokes LONG TERM GOAL #1   Title Kayla Stokes to be I with advanced HEP    Time 4    Period Months    Status New    Target Date 05/14/21      Kayla Stokes LONG TERM GOAL #2   Title Kayla Stokes to  demonstrate at least 4/5 pelvic floor strength for decreased leakage symptoms    Time 4    Period Months    Status New    Target Date 05/14/21      Kayla Stokes LONG TERM GOAL #3   Title Kayla Stokes to report no more than one urinary leak per week to improve QOL and skin integrity at vulva.    Time 4    Period Months    Status New     Target Date 05/14/21      Kayla Stokes LONG TERM GOAL #4   Title Kayla Stokes to demonstrate improved bil hip stength to 5/5 throughout to improve pelvic stability    Time 4    Period Months    Status New    Target Date 05/14/21                   Plan - 02/13/21 1007     Clinical Impression Statement Kayla Stokes presents to clinic reporting improvement noted in no fecal leakage, improved stool consistencies and no straining with use of squatty potty since eval, continues to have urinary leakage. Kayla Stokes session focused on hip and core strengthening with proper breathing and pelvic floor coordination with need of cues throughout for Kayla Stokes not to hold breath and to engage core. Kayla Stokes tolerated well and reported improved understanding of this for workouts at home. Kayla Stokes also educated on HEP, given handouts and went over all during treatment, and reeducated on urge drill. Kayla Stokes would benefit from additional Kayla Stokes to further address deficits found at eval.    Personal Factors and Comorbidities Comorbidity 1;Fitness    Comorbidities x3 vaginal births, one tearing (with first) into EAS per Kayla Stokes    Examination-Activity Limitations Continence;Hygiene/Grooming;Squat;Caring for Others;Transfers;Lift    Examination-Participation Restrictions Community Activity;Driving;Interpersonal Relationship;Shop    Stability/Clinical Decision Making Evolving/Moderate complexity    Rehab Potential Good    Kayla Stokes Frequency 1x / week    Kayla Stokes Duration Other (comment)   10 weeks   Kayla Stokes Treatment/Interventions ADLs/Self Care Home Management;Aquatic Therapy;Functional mobility training;Therapeutic activities;Therapeutic exercise;Neuromuscular re-education;Manual techniques;Patient/family education;Scar mobilization;Passive range of motion;Energy conservation;Taping    Kayla Stokes Next Visit Plan go over all handouts, breathing mechanics, prolapse pressure management    Consulted and Agree with Plan of Care Patient             Patient will benefit from skilled  therapeutic intervention in order to improve the following deficits and impairments:  Decreased endurance, Decreased coordination, Improper body mechanics, Impaired flexibility, Decreased scar mobility, Decreased mobility, Decreased strength, Postural dysfunction  Visit Diagnosis: Abnormal posture  Lack of coordination  Muscle weakness (generalized)     Problem List Patient Active Problem List   Diagnosis Date Noted   Impingement syndrome of right shoulder region 03/08/2020   Lumbar radiculopathy 11/04/2019   Trochanteric bursitis of left hip 09/12/2019   History of prosthetic unicompartmental arthroplasty of right knee 04/05/2019   Low back pain 11/29/2018   Family history of gastric cancer 08/10/2018   Epigastric abdominal pain 08/10/2018   Pain in right knee 07/23/2017   Noise effect on both inner ears 07/22/2017   Presbycusis of both ears 07/22/2017   Stage 3 chronic kidney disease ([place]) 06/29/2017   Diverticulitis 11/04/2016   Benign essential hypertension 11/17/2015   OA (osteoarthritis) of knee 05/22/2014   Colon polyps 06/21/2012   Diverticulosis 06/21/2012   Fluid retention 06/21/2012   History of pancreatitis 05/17/2012   H. pylori infection    Varicose veins of lower extremities with other  complications 96/72/8979   GASTRITIS 02/07/2010   Hyperlipidemia 01/16/2010   Anxiety state 01/16/2010   Allergic rhinitis 01/16/2010   DIVERTICULAR DISEASE 01/16/2010   Constipation 01/16/2010   NAUSEA AND VOMITING 01/16/2010   PERSONAL HX COLONIC POLYPS 01/16/2010    Kayla Stokes, Kayla Stokes, Kayla Stokes 02/13/2209:16 AM   [facility] @ [address] [place], [place], [address] Phone: [phone]   Fax:  [phone]  Name: Kayla Stokes MRN: [id number] Date of Birth: [birth year]

## 2021-02-27 ENCOUNTER — Ambulatory Visit: Payer: Medicare PPO | Attending: Nurse Practitioner | Admitting: Physical Therapy

## 2021-02-27 ENCOUNTER — Other Ambulatory Visit: Payer: Self-pay

## 2021-02-27 DIAGNOSIS — M6281 Muscle weakness (generalized): Secondary | ICD-10-CM | POA: Diagnosis present

## 2021-02-27 DIAGNOSIS — R279 Unspecified lack of coordination: Secondary | ICD-10-CM

## 2021-02-27 NOTE — Therapy (Signed)
[place] @ [facility] [place] [place], [place], [address] Phone: [phone]   Fax:  [phone]  Physical Therapy Treatment  Patient Details  Name: Kayla Stokes MRN: [id number] Date of Birth: 1945-12-24 Referring Provider (PT): Tamela Gammon, NP   Encounter Date: 02/27/2021   PT End of Session - 02/27/21 1002     Visit Number 3    Date for PT Re-Evaluation 05/14/21    Authorization Type [employer]    PT Start Time [phone]   pt arrival   PT Stop Time 1010    PT Time Calculation (min) 39 min    Activity Tolerance Patient tolerated treatment well    Behavior During Therapy [facility] for tasks assessed/performed             Past Medical History:  Diagnosis Date   Allergic rhinitis    Arthritis    finger   Bursitis of right hip    Complication of anesthesia    difficult to awaken after gallbladder surgery   Dental crowns present    Eczema    elbow   GERD (gastroesophageal reflux disease)    H. pylori infection    History of hiatal hernia    Hypertension    under control, has been on med. x 10 yrs.   Mass of hand 05/2011   right   Measles    hx of    Mumps    hx of    Osteopenia 11/2012   T score -1.1 left forearm FRAX 6.2%/0.1%   Pancreatitis    Rubella    hx of    Trochanteric bursitis of left hip     Past Surgical History:  Procedure Laterality Date   ABDOMINAL HYSTERECTOMY     ABLATION SAPHENOUS VEIN W/ RFA  10-2011   CATARACT EXTRACTION W/ INTRAOCULAR LENS  IMPLANT, BILATERAL  09/2010, 01/2011   bilateral   CHOLECYSTECTOMY  MAY 2014   CHONDROPLASTY  02/12/2011   Procedure: CHONDROPLASTY;  Surgeon: Gearlean Alf;  Location: [place];  Service: Orthopedics;  Laterality: Left;   EYE SURGERY     laser right eye; due to right eye hemorrhage; had left eye retinal tear with subsequent surgery   KNEE ARTHROSCOPY  02/12/2011   Procedure: ARTHROSCOPY KNEE;  Surgeon: Gearlean Alf;   Location: [place];  Service: Orthopedics;  Laterality: Left;   KNEE SURGERY  2021   MASS EXCISION  06/05/2011   Procedure: EXCISION MASS;  Surgeon: Linna Hoff, MD;  Location: [place];  Service: Orthopedics;  Laterality: Right;  right thumb   MENISCUS DEBRIDEMENT  02/12/2011   Procedure: DEBRIDEMENT OF MENISCUS;  Surgeon: Gearlean Alf;  Location: [place];  Service: Orthopedics;  Laterality: Left;   MOUTH SURGERY     implants    stent placement after cholecestectomy     TOTAL KNEE ARTHROPLASTY Left 05/22/2014   Procedure: LEFT TOTAL KNEE ARTHROPLASTY;  Surgeon: Gaynelle Arabian, MD;  Location: [facility];  Service: Orthopedics;  Laterality: Left;   TUBAL LIGATION     VAGINAL HYSTERECTOMY WITH A&P REPAIR  1989   VARICOSE VEIN SURGERY  02/13/2003   ligation and stripping left greater saphenous; exc.  multiple varicosities    There were no vitals filed for this visit.   Subjective Assessment - 02/27/21 0932     Subjective Pt reports she has not had time to do HEP with holidays but does  report the leakage "has not been as bad". Pt has been doing urge drill which helps but does have a UTI currently and this has limited improvement. Pt has no further concerns with with bowels at this time this has improved.    Pertinent History TVH with A & P repair 1989,    Currently in Pain? No/denies                               [facility] Adult PT Treatment/Exercise - 02/27/21 0001       Exercises   Exercises Lumbar;Knee/Hip      Lumbar Exercises: Standing   Forward Lunge --    Forward Lunge Limitations --    Other Standing Lumbar Exercises mario punches x10 2x4# DB    Other Standing Lumbar Exercises palloffs green band 2x10; rotation palloffs green 2x10      Lumbar Exercises: Seated   Sit to Stand 20 reps    Sit to Stand Limitations 10# kettlebell with coordination of breath and PF      Lumbar Exercises: Quadruped   Other  Quadruped Lumbar Exercises knee hovers unilate ball press x10 each hand                     PT Education - 02/27/21 1000     Education Details Pt educated on continuing HEP; core activation with all exercises and breathing coordination    Person(s) Educated Patient    Methods Explanation;Demonstration;Tactile cues;Verbal cues    Comprehension Returned demonstration;Verbalized understanding              PT Short Term Goals - 01/14/21 1225       PT SHORT TERM GOAL #1   Title Pt to be I with HEP    Time 5    Period Weeks    Status New    Target Date 02/18/21      PT SHORT TERM GOAL #2   Title pt to demonstrate at least 3/5 pelvic floor strength for decreased leakage symptoms.    Time 5    Period Weeks    Target Date 02/18/21      PT SHORT TERM GOAL #3   Title pt to report no more than one urinary leak per day to improve QOL and skin integrity at vulva.    Time 5    Period Weeks    Status New    Target Date 02/18/21      PT SHORT TERM GOAL #4   Title pt to recall and demonsrate good recall of voiding mechanics to decrease straining at pelvic floor and prolapse.    Time 5    Period Weeks    Status New    Target Date 02/18/21               PT Long Term Goals - 01/14/21 1227       PT LONG TERM GOAL #1   Title Pt to be I with advanced HEP    Time 4    Period Months    Status New    Target Date 05/14/21      PT LONG TERM GOAL #2   Title pt to demonstrate at least 4/5 pelvic floor strength for decreased leakage symptoms    Time 4    Period Months    Status New    Target Date 05/14/21      PT LONG TERM GOAL #3  Title pt to report no more than one urinary leak per week to improve QOL and skin integrity at vulva.    Time 4    Period Months    Status New    Target Date 05/14/21      PT LONG TERM GOAL #4   Title pt to demonstrate improved bil hip stength to 5/5 throughout to improve pelvic stability    Time 4    Period Months    Status  New    Target Date 05/14/21                   Plan - 02/27/21 1002     Clinical Impression Statement Pt presents to clinic reporting improvement with bowels and some with leakage but still has leakage often. Pt denied concerns about bowels at this time and pleased this has improved, does have a UTI currently and this has also limited her progress with incontinence. Pt session focused on core and hip strengthening with coordination of breathing and PF with cues for this. Pt demonstrated good ability to complete all tasks and reports she feels like she can tell when she is kegeling and would like ot do internal next session. Pt would benefit from additional PT to further address deficits found at eval.    Personal Factors and Comorbidities Comorbidity 1;Fitness    Comorbidities x3 vaginal births, one tearing (with first) into EAS per pt    Examination-Activity Limitations Continence;Hygiene/Grooming;Squat;Caring for Others;Transfers;Lift    Examination-Participation Restrictions Community Activity;Driving;Interpersonal Relationship;Shop    Stability/Clinical Decision Making Evolving/Moderate complexity    Rehab Potential Good    PT Frequency 1x / week    PT Duration Other (comment)   10 weeks   PT Treatment/Interventions ADLs/Self Care Home Management;Aquatic Therapy;Functional mobility training;Therapeutic activities;Therapeutic exercise;Neuromuscular re-education;Manual techniques;Patient/family education;Scar mobilization;Passive range of motion;Energy conservation;Taping    PT Next Visit Plan internal    PT Home Exercise Plan [id number]    Consulted and Agree with Plan of Care Patient             Patient will benefit from skilled therapeutic intervention in order to improve the following deficits and impairments:  Decreased endurance, Decreased coordination, Improper body mechanics, Impaired flexibility, Decreased scar mobility, Decreased mobility, Decreased strength, Postural  dysfunction  Visit Diagnosis: Lack of coordination  Muscle weakness (generalized)     Problem List Patient Active Problem List   Diagnosis Date Noted   Impingement syndrome of right shoulder region 03/08/2020   Lumbar radiculopathy 11/04/2019   Trochanteric bursitis of left hip 09/12/2019   History of prosthetic unicompartmental arthroplasty of right knee 04/05/2019   Low back pain 11/29/2018   Family history of gastric cancer 08/10/2018   Epigastric abdominal pain 08/10/2018   Pain in right knee 07/23/2017   Noise effect on both inner ears 07/22/2017   Presbycusis of both ears 07/22/2017   Stage 3 chronic kidney disease ([place]) 06/29/2017   Diverticulitis 11/04/2016   Benign essential hypertension 11/17/2015   OA (osteoarthritis) of knee 05/22/2014   Colon polyps 06/21/2012   Diverticulosis 06/21/2012   Fluid retention 06/21/2012   History of pancreatitis 05/17/2012   H. pylori infection    Varicose veins of lower extremities with other complications 83/15/1761   GASTRITIS 02/07/2010   Hyperlipidemia 01/16/2010   Anxiety state 01/16/2010   Allergic rhinitis 01/16/2010   DIVERTICULAR DISEASE 01/16/2010   Constipation 01/16/2010   NAUSEA AND VOMITING 01/16/2010   PERSONAL HX COLONIC POLYPS 01/16/2010   Stacy Gardner, PT,  DPT 02/28/2308:16 AM   [facility] [place] @ [facility] [place], [place], [address] Phone: [phone]   Fax:  [phone]  Name: DESTENEE GUERRY MRN: [id number] Date of Birth: [birth year]

## 2021-03-04 ENCOUNTER — Ambulatory Visit: Payer: Medicare PPO | Admitting: Physical Therapy

## 2021-03-04 ENCOUNTER — Other Ambulatory Visit: Payer: Self-pay

## 2021-03-04 DIAGNOSIS — R279 Unspecified lack of coordination: Secondary | ICD-10-CM | POA: Diagnosis not present

## 2021-03-04 DIAGNOSIS — M6281 Muscle weakness (generalized): Secondary | ICD-10-CM

## 2021-03-04 NOTE — Therapy (Signed)
[place] @ [place], [place], [address] Phone: [phone]   Fax:  [phone]  Physical Therapy Treatment  Patient Details  Name: Kayla Stokes MRN: [id number] Date of Birth: 05-24-1945 Referring Provider (PT): Tamela Gammon, NP   Encounter Date: 03/04/2021   PT End of Session - 03/04/21 1042     Visit Number 4    Date for PT Re-Evaluation 05/14/21    Authorization Type [employer]    PT Start Time 1006    PT Stop Time 1045    PT Time Calculation (min) 39 min    Activity Tolerance Patient tolerated treatment well    Behavior During Therapy [facility] for tasks assessed/performed             Past Medical History:  Diagnosis Date   Allergic rhinitis    Arthritis    finger   Bursitis of right hip    Complication of anesthesia    difficult to awaken after gallbladder surgery   Dental crowns present    Eczema    elbow   GERD (gastroesophageal reflux disease)    H. pylori infection    History of hiatal hernia    Hypertension    under control, has been on med. x 10 yrs.   Mass of hand 05/2011   right   Measles    hx of    Mumps    hx of    Osteopenia 11/2012   T score -1.1 left forearm FRAX 6.2%/0.1%   Pancreatitis    Rubella    hx of    Trochanteric bursitis of left hip     Past Surgical History:  Procedure Laterality Date   ABDOMINAL HYSTERECTOMY     ABLATION SAPHENOUS VEIN W/ RFA  10-2011   CATARACT EXTRACTION W/ INTRAOCULAR LENS  IMPLANT, BILATERAL  09/2010, 01/2011   bilateral   CHOLECYSTECTOMY  MAY 2014   CHONDROPLASTY  02/12/2011   Procedure: CHONDROPLASTY;  Surgeon: Gearlean Alf;  Location: [place];  Service: Orthopedics;  Laterality: Left;   EYE SURGERY     laser right eye; due to right eye hemorrhage; had left eye retinal tear with subsequent surgery   KNEE ARTHROSCOPY  02/12/2011   Procedure: ARTHROSCOPY KNEE;  Surgeon: Gearlean Alf;  Location: [place];  Service: Orthopedics;  Laterality: Left;   KNEE SURGERY  2021   MASS EXCISION  06/05/2011   Procedure: EXCISION MASS;  Surgeon: Linna Hoff, MD;  Location: [place];  Service: Orthopedics;  Laterality: Right;  right thumb   MENISCUS DEBRIDEMENT  02/12/2011   Procedure: DEBRIDEMENT OF MENISCUS;  Surgeon: Gearlean Alf;  Location: [place];  Service: Orthopedics;  Laterality: Left;   MOUTH SURGERY     implants    stent placement after cholecestectomy     TOTAL KNEE ARTHROPLASTY Left 05/22/2014   Procedure: LEFT TOTAL KNEE ARTHROPLASTY;  Surgeon: Gaynelle Arabian, MD;  Location: [facility];  Service: Orthopedics;  Laterality: Left;   TUBAL LIGATION     VAGINAL HYSTERECTOMY WITH A&P REPAIR  1989   VARICOSE VEIN SURGERY  02/13/2003   ligation and stripping left greater saphenous; exc.  multiple varicosities    There were no vitals filed for this visit.   Subjective Assessment - 03/04/21 1008     Subjective Pt reports continued no concerns with BMs, leakage has been better and only had one instance with  small amount of leakage after waiting too long. Pt now able to go 2-3 hours between bladder voids and only getting up 1x a night to urinate.    Pertinent History TVH with A & P repair 1989,    Currently in Pain? No/denies                            Pelvic Floor Special Questions - 03/04/21 0001     Pelvic Floor Internal Exam patient identified and patient confirms consent for PT to perform internal soft tissue work and muscle strength and integrity assessment    Exam Type Vaginal    Sensation WFL    Palpation mild TTP Rt pubococcygeus and iliococcygeus    Strength fair squeeze, definite lift    Strength # of reps 8    Strength # of seconds 7    Tone improving               [facility] Adult PT Treatment/Exercise - 03/04/21 0001       Self-Care   Self-Care Other Self-Care Comments    Other Self-Care Comments  Pt  educated on incorperation of pelvic floor contract/relax with functional tasks with core and breathing for decreased strain at PF.      Manual Therapy   Manual Therapy Internal Pelvic Floor    Internal Pelvic Floor Pt directed in 2x10 contractions/relaxions; 0F74 quick flicks; and x5 8s holds.                     PT Education - 03/04/21 1041     Education Details Pt educated on continuing HEP and incorperating coordination of core/breathing/pelvic floor with functional tasks at home    Person(s) Educated Patient    Methods Explanation;Demonstration;Tactile cues;Verbal cues    Comprehension Returned demonstration;Verbalized understanding              PT Short Term Goals - 03/04/21 1047       PT SHORT TERM GOAL #1   Title Pt to be I with HEP    Time 5    Period Weeks    Status Achieved    Target Date 02/18/21      PT SHORT TERM GOAL #2   Title pt to demonstrate at least 3/5 pelvic floor strength for decreased leakage symptoms.    Time 5    Period Weeks    Status Achieved    Target Date 02/18/21      PT SHORT TERM GOAL #3   Title pt to report no more than one urinary leak per day to improve QOL and skin integrity at vulva.    Time 5    Period Weeks    Status Achieved    Target Date 02/18/21      PT SHORT TERM GOAL #4   Title pt to recall and demonsrate good recall of voiding mechanics to decrease straining at pelvic floor and prolapse.    Time 5    Period Weeks    Status Achieved    Target Date 02/18/21               PT Long Term Goals - 03/04/21 1047       PT LONG TERM GOAL #1   Title Pt to be I with advanced HEP    Time 4    Period Months    Status On-going    Target Date 05/14/21  PT LONG TERM GOAL #2   Title pt to demonstrate at least 4/5 pelvic floor strength for decreased leakage symptoms    Time 4    Period Months    Status On-going    Target Date 05/14/21      PT LONG TERM GOAL #3   Title pt to report no more than one  urinary leak per week to improve QOL and skin integrity at vulva.    Time 4    Period Months    Status On-going    Target Date 05/14/21      PT LONG TERM GOAL #4   Title pt to demonstrate improved bil hip stength to 5/5 throughout to improve pelvic stability    Time 4    Period Months    Status On-going    Target Date 05/14/21                   Plan - 03/04/21 1042     Clinical Impression Statement Pt presents to clinic reporting continued improvement with bowels, and only one instance of small urinary leakage with waiting too long between voids and stood up with strong urge then had small leak unable to without it. Pt reports decreased frequency to now 2-3 hours between bladder voids. Pt consented to internal vaginal treatment this date, demonstrated improvement in pelvic floor strength, endurance and coordination. Pt now able to demonstrate 3/5 strength with ability to hold for 7s and 8 reps. Pt demonstrated ability to hold contraction for 10s total however decreased in strength to 2/5 at 7s. Pt tolerated session well, did have mild TTP at anterior pubococcygeus, iliococcygeus but denied any pain outside of this or functionally. Pt educated on continuing HEP and activity/fitness in her group classes but to incorperate pelvic floor/breathing/core activations with these activities and functional tasks at home to improve coordination and decreased leakage symptoms. Pt would benefit from additional PT to further address deficits found at eval. Pt continues to progress toward goals and has met STG this session and continues demo need to met LTG.    Personal Factors and Comorbidities Comorbidity 1;Fitness    Comorbidities x3 vaginal births, one tearing (with first) into EAS per pt    Examination-Activity Limitations Continence;Hygiene/Grooming;Squat;Caring for Others;Transfers;Lift    Examination-Participation Restrictions Community Activity;Driving;Interpersonal Relationship;Shop     Stability/Clinical Decision Making Evolving/Moderate complexity    Rehab Potential Good    PT Frequency 1x / week    PT Duration Other (comment)   10 weeks   PT Treatment/Interventions ADLs/Self Care Home Management;Aquatic Therapy;Functional mobility training;Therapeutic activities;Therapeutic exercise;Neuromuscular re-education;Manual techniques;Patient/family education;Scar mobilization;Passive range of motion;Energy conservation;Taping    PT Next Visit Plan hip and core strengthening and stretching with coordination of breathing/core/PF    PT Home Exercise Plan [id number]    Consulted and Agree with Plan of Care Patient             Patient will benefit from skilled therapeutic intervention in order to improve the following deficits and impairments:  Decreased endurance, Decreased coordination, Improper body mechanics, Impaired flexibility, Decreased scar mobility, Decreased mobility, Decreased strength, Postural dysfunction  Visit Diagnosis: Muscle weakness (generalized)  Lack of coordination     Problem List Patient Active Problem List   Diagnosis Date Noted   Impingement syndrome of right shoulder region 03/08/2020   Lumbar radiculopathy 11/04/2019   Trochanteric bursitis of left hip 09/12/2019   History of prosthetic unicompartmental arthroplasty of right knee 04/05/2019   Low back pain 11/29/2018  Family history of gastric cancer 08/10/2018   Epigastric abdominal pain 08/10/2018   Pain in right knee 07/23/2017   Noise effect on both inner ears 07/22/2017   Presbycusis of both ears 07/22/2017   Stage 3 chronic kidney disease ([place]) 06/29/2017   Diverticulitis 11/04/2016   Benign essential hypertension 11/17/2015   OA (osteoarthritis) of knee 05/22/2014   Colon polyps 06/21/2012   Diverticulosis 06/21/2012   Fluid retention 06/21/2012   History of pancreatitis 05/17/2012   H. pylori infection    Varicose veins of lower extremities with other complications 51/61/4432    GASTRITIS 02/07/2010   Hyperlipidemia 01/16/2010   Anxiety state 01/16/2010   Allergic rhinitis 01/16/2010   DIVERTICULAR DISEASE 01/16/2010   Constipation 01/16/2010   NAUSEA AND VOMITING 01/16/2010   PERSONAL HX COLONIC POLYPS 01/16/2010   No emotional/communication barriers or cognitive limitation. Patient is motivated to learn. Patient understands and agrees with treatment goals and plan. PT explains patient will be examined in standing, sitting, and lying down to see how their muscles and joints work. When they are ready, they will be asked to remove their underwear so PT can examine their perineum. The patient is also given the option of providing their own chaperone as one is not provided in our facility. The patient also has the right and is explained the right to defer or refuse any part of the evaluation or treatment including the internal exam. With the patient's consent, PT will use one gloved finger to gently assess the muscles of the pelvic floor, seeing how well it contracts and relaxes and if there is muscle symmetry. After, the patient will get dressed and PT and patient will discuss exam findings and plan of care. PT and patient discuss plan of care, schedule, attendance policy and HEP activities.   Stacy Gardner, PT, DPT 03/04/2308:50 AM   [facility] @ [address] [place] [place], [place], [address] Phone: [phone]   Fax:  [phone]  Name: KAYDAN WILHOITE MRN: [id number] Date of Birth: 03-31-1945

## 2021-03-11 ENCOUNTER — Other Ambulatory Visit: Payer: Self-pay

## 2021-03-11 ENCOUNTER — Encounter: Payer: Self-pay | Admitting: Physical Therapy

## 2021-03-11 ENCOUNTER — Ambulatory Visit: Payer: Medicare PPO | Admitting: Physical Therapy

## 2021-03-11 DIAGNOSIS — R279 Unspecified lack of coordination: Secondary | ICD-10-CM

## 2021-03-11 DIAGNOSIS — M6281 Muscle weakness (generalized): Secondary | ICD-10-CM

## 2021-03-11 NOTE — Therapy (Signed)
[facility] @ [place], [place], [address] Phone: [phone]   Fax:  [phone]  Physical Therapy Treatment  Patient Details  Name: Kayla Stokes MRN: [id number] Date of Birth: [birth year] Referring Provider (PT): Tamela Gammon, NP   Encounter Date: 03/11/2021   PT End of Session - 03/11/21 1040     Visit Number 5    Date for PT Re-Evaluation 05/14/21    Authorization Type [employer]    PT Start Time 1016    PT Stop Time 1055    PT Time Calculation (min) 39 min    Activity Tolerance Patient tolerated treatment well    Behavior During Therapy [facility] for tasks assessed/performed             Past Medical History:  Diagnosis Date   Allergic rhinitis    Arthritis    finger   Bursitis of right hip    Complication of anesthesia    difficult to awaken after gallbladder surgery   Dental crowns present    Eczema    elbow   GERD (gastroesophageal reflux disease)    H. pylori infection    History of hiatal hernia    Hypertension    under control, has been on med. x 10 yrs.   Mass of hand 05/2011   right   Measles    hx of    Mumps    hx of    Osteopenia 11/2012   T score -1.1 left forearm FRAX 6.2%/0.1%   Pancreatitis    Rubella    hx of    Trochanteric bursitis of left hip     Past Surgical History:  Procedure Laterality Date   ABDOMINAL HYSTERECTOMY     ABLATION SAPHENOUS VEIN W/ RFA  10-2011   CATARACT EXTRACTION W/ INTRAOCULAR LENS  IMPLANT, BILATERAL  09/2010, 01/2011   bilateral   CHOLECYSTECTOMY  MAY 2014   CHONDROPLASTY  02/12/2011   Procedure: CHONDROPLASTY;  Surgeon: Gearlean Alf;  Location: [facility];  Service: Orthopedics;  Laterality: Left;   EYE SURGERY     laser right eye; due to right eye hemorrhage; had left eye retinal tear with subsequent surgery   KNEE ARTHROSCOPY  02/12/2011   Procedure: ARTHROSCOPY KNEE;  Surgeon: Gearlean Alf;  Location:  [facility];  Service: Orthopedics;  Laterality: Left;   KNEE SURGERY  2021   MASS EXCISION  06/05/2011   Procedure: EXCISION MASS;  Surgeon: Linna Hoff, MD;  Location: [facility];  Service: Orthopedics;  Laterality: Right;  right thumb   MENISCUS DEBRIDEMENT  02/12/2011   Procedure: DEBRIDEMENT OF MENISCUS;  Surgeon: Gearlean Alf;  Location: [facility];  Service: Orthopedics;  Laterality: Left;   MOUTH SURGERY     implants    stent placement after cholecestectomy     TOTAL KNEE ARTHROPLASTY Left 05/22/2014   Procedure: LEFT TOTAL KNEE ARTHROPLASTY;  Surgeon: Gaynelle Arabian, MD;  Location: [facility];  Service: Orthopedics;  Laterality: Left;   TUBAL LIGATION     VAGINAL HYSTERECTOMY WITH A&P REPAIR  1989   VARICOSE VEIN SURGERY  02/13/2003   ligation and stripping left greater saphenous; exc.  multiple varicosities    There were no vitals filed for this visit.   Subjective Assessment - 03/11/21 1020     Subjective Pt reports she "strained my shoulder moving stones this morning". Pt reports she forgot to wear a  pad last night and noticed some dampness in pants and wasn't aware she leaked during the night. Pt reports she does think she has improved since last starting PT with less leakage and smaller amounts of urine when she does leak. Pt is able to hold urine longer than 3 hours and did not get up last night to urinate.    Pertinent History TVH with A & P repair 1989,    How long can you sit comfortably? no limits    How long can you stand comfortably? no limits    How long can you walk comfortably? no limits    Currently in Pain? No/denies                               [facility] Adult PT Treatment/Exercise - 03/11/21 0001       Exercises   Exercises Lumbar;Knee/Hip      Lumbar Exercises: Standing   Other Standing Lumbar Exercises sit<>stand with breathing and pelvic floor coordination x5      Lumbar Exercises:  Supine   Clam 20 reps    Clam Limitations red loop    Bridge 10 reps    Bridge with [employer] 10 reps    Other Supine Lumbar Exercises hip flexion red loop 2x10    Other Supine Lumbar Exercises same arm/knee press isometric 5s 2x10                     PT Education - 03/11/21 1039     Education Details Pt educated on adding prolapse relief position at end of the day    Person(s) Educated Patient    Methods Explanation;Demonstration;Tactile cues;Verbal cues    Comprehension Returned demonstration;Verbalized understanding              PT Short Term Goals - 03/04/21 1047       PT SHORT TERM GOAL #1   Title Pt to be I with HEP    Time 5    Period Weeks    Status Achieved    Target Date 02/18/21      PT SHORT TERM GOAL #2   Title pt to demonstrate at least 3/5 pelvic floor strength for decreased leakage symptoms.    Time 5    Period Weeks    Status Achieved    Target Date 02/18/21      PT SHORT TERM GOAL #3   Title pt to report no more than one urinary leak per day to improve QOL and skin integrity at vulva.    Time 5    Period Weeks    Status Achieved    Target Date 02/18/21      PT SHORT TERM GOAL #4   Title pt to recall and demonsrate good recall of voiding mechanics to decrease straining at pelvic floor and prolapse.    Time 5    Period Weeks    Status Achieved    Target Date 02/18/21               PT Long Term Goals - 03/04/21 1047       PT LONG TERM GOAL #1   Title Pt to be I with advanced HEP    Time 4    Period Months    Status On-going    Target Date 05/14/21      PT LONG TERM GOAL #2   Title pt to demonstrate at least  4/5 pelvic floor strength for decreased leakage symptoms    Time 4    Period Months    Status On-going    Target Date 05/14/21      PT LONG TERM GOAL #3   Title pt to report no more than one urinary leak per week to improve QOL and skin integrity at vulva.    Time 4    Period Months    Status On-going     Target Date 05/14/21      PT LONG TERM GOAL #4   Title pt to demonstrate improved bil hip stength to 5/5 throughout to improve pelvic stability    Time 4    Period Months    Status On-going    Target Date 05/14/21                   Plan - 03/11/21 1044     Clinical Impression Statement Pt presents in clinic reporting continued improvement with symptoms, decreased leakage overall, increasing time between voids, and no issues at all with bowels continue. Pt requested to have less exercises with use of upper body if possible as her Rt shoulder is bothering her this morning. Session focused on core and hip strengthening with cues for coordination of breathing mechanics and pelvic floor. Pt tolerated well and added challenges of pelvic floor contraction with exercises with session to improve coordination.  Pt demonstrated improved ability to complete exercises well with less cues needed overall. Pt continues to progress toward goals and has met STG this session and continues demo need to met LTG.    Personal Factors and Comorbidities Comorbidity 1;Fitness    Comorbidities x3 vaginal births, one tearing (with first) into EAS per pt    Examination-Activity Limitations Continence;Hygiene/Grooming;Squat;Caring for Others;Transfers;Lift    Examination-Participation Restrictions Community Activity;Driving;Interpersonal Relationship;Shop    Stability/Clinical Decision Making Evolving/Moderate complexity    Rehab Potential Good    PT Frequency 1x / week    PT Duration Other (comment)   10 weeks   PT Treatment/Interventions ADLs/Self Care Home Management;Aquatic Therapy;Functional mobility training;Therapeutic activities;Therapeutic exercise;Neuromuscular re-education;Manual techniques;Patient/family education;Scar mobilization;Passive range of motion;Energy conservation;Taping    PT Next Visit Plan hip and core strengthening and stretching with coordination of breathing/core/PF    PT Home  Exercise Plan [id number]    Consulted and Agree with Plan of Care Patient             Patient will benefit from skilled therapeutic intervention in order to improve the following deficits and impairments:  Decreased endurance, Decreased coordination, Improper body mechanics, Impaired flexibility, Decreased scar mobility, Decreased mobility, Decreased strength, Postural dysfunction  Visit Diagnosis: Muscle weakness (generalized)  Lack of coordination     Problem List Patient Active Problem List   Diagnosis Date Noted   Impingement syndrome of right shoulder region 03/08/2020   Lumbar radiculopathy 11/04/2019   Trochanteric bursitis of left hip 09/12/2019   History of prosthetic unicompartmental arthroplasty of right knee 04/05/2019   Low back pain 11/29/2018   Family history of gastric cancer 08/10/2018   Epigastric abdominal pain 08/10/2018   Pain in right knee 07/23/2017   Noise effect on both inner ears 07/22/2017   Presbycusis of both ears 07/22/2017   Stage 3 chronic kidney disease ([place]) 06/29/2017   Diverticulitis 11/04/2016   Benign essential hypertension 11/17/2015   OA (osteoarthritis) of knee 05/22/2014   Colon polyps 06/21/2012   Diverticulosis 06/21/2012   Fluid retention 06/21/2012   History of pancreatitis 05/17/2012  H. pylori infection    Varicose veins of lower extremities with other complications 53/66/4403   GASTRITIS 02/07/2010   Hyperlipidemia 01/16/2010   Anxiety state 01/16/2010   Allergic rhinitis 01/16/2010   DIVERTICULAR DISEASE 01/16/2010   Constipation 01/16/2010   NAUSEA AND VOMITING 01/16/2010   PERSONAL HX COLONIC POLYPS 01/16/2010    Stacy Gardner, PT, DPT 03/11/2308:56 AM   [facility] @ [address] [place], [place], [address] Phone: [phone]   Fax:  [phone]  Name: MAGON CROSON MRN: [id number] Date of Birth: 12-Sep-1945

## 2021-03-25 ENCOUNTER — Other Ambulatory Visit: Payer: Self-pay

## 2021-03-25 ENCOUNTER — Emergency Department (HOSPITAL_BASED_OUTPATIENT_CLINIC_OR_DEPARTMENT_OTHER): Payer: Medicare PPO

## 2021-03-25 ENCOUNTER — Ambulatory Visit: Payer: Medicare PPO | Admitting: Physical Therapy

## 2021-03-25 ENCOUNTER — Encounter (HOSPITAL_BASED_OUTPATIENT_CLINIC_OR_DEPARTMENT_OTHER): Payer: Self-pay | Admitting: *Deleted

## 2021-03-25 ENCOUNTER — Emergency Department (HOSPITAL_BASED_OUTPATIENT_CLINIC_OR_DEPARTMENT_OTHER)
Admission: EM | Admit: 2021-03-25 | Discharge: 2021-03-25 | Disposition: A | Discharge status: Home / Self Care | Payer: Medicare PPO | Attending: Emergency Medicine | Admitting: Emergency Medicine

## 2021-03-25 DIAGNOSIS — R059 Cough, unspecified: Secondary | ICD-10-CM | POA: Diagnosis present

## 2021-03-25 DIAGNOSIS — Z20822 Contact with and (suspected) exposure to covid-19: Secondary | ICD-10-CM | POA: Insufficient documentation

## 2021-03-25 DIAGNOSIS — J439 Emphysema, unspecified: Secondary | ICD-10-CM

## 2021-03-25 DIAGNOSIS — Z79899 Other long term (current) drug therapy: Secondary | ICD-10-CM | POA: Diagnosis not present

## 2021-03-25 DIAGNOSIS — J069 Acute upper respiratory infection, unspecified: Secondary | ICD-10-CM | POA: Diagnosis not present

## 2021-03-25 LAB — RESP PANEL BY RT-PCR (FLU A&B, COVID) ARPGX2
Influenza A by PCR: NEGATIVE
Influenza B by PCR: NEGATIVE
SARS Coronavirus 2 by RT PCR: NEGATIVE

## 2021-03-25 MED ORDER — BENZONATATE 100 MG PO CAPS
200.0000 mg | ORAL_CAPSULE | Freq: Once | ORAL | Status: AC
  Administered 2021-03-25: 200 mg via ORAL
  Filled 2021-03-25: qty 2

## 2021-03-25 MED ORDER — ACETAMINOPHEN 500 MG PO TABS
1000.0000 mg | ORAL_TABLET | Freq: Once | ORAL | Status: AC
  Administered 2021-03-25: 1000 mg via ORAL
  Filled 2021-03-25: qty 2

## 2021-03-25 MED ORDER — BENZONATATE 100 MG PO CAPS: 100.0000 mg | ORAL_CAPSULE | Freq: Three times a day (TID) | ORAL | 0 refills | Status: DC

## 2021-03-25 NOTE — ED Triage Notes (Addendum)
States she has been sick for one week with cough, congestion and productive cough. Low grade fevers. Was seen by her MD on Friday and started on a steroid. Has not had a xray. C/o feeling sob. Was also tested for Covd and Flu which were neg. No distress noted.

## 2021-03-25 NOTE — ED Provider Notes (Signed)
[facility] EMERGENCY DEPT Provider Note   CSN: [id number] Arrival date & time: 03/25/21  0302     History  Chief Complaint  Patient presents with   Cough    Kayla Stokes is a 76 y.o. female.  The history is provided by the patient.  Cough Cough characteristics:  Productive Sputum characteristics:  Nondescript Severity:  Moderate Onset quality:  Gradual Duration:  7 days Timing:  Intermittent Progression:  Unchanged Chronicity:  New Smoker: no   Context: sick contacts and upper respiratory infection   Context: not animal exposure   Context comment:  Grandchild with same symptoms.  Had steroids already, these did not work.  Also body aches headache and congestion Relieved by:  Nothing Worsened by:  Nothing Ineffective treatments: flonase and steroids. Associated symptoms: myalgias and sinus congestion   Associated symptoms: no chest pain, no headaches, no shortness of breath, no sore throat and no wheezing   Risk factors: no chemical exposure       Home Medications Prior to Admission medications   Medication Sig Start Date End Date Taking? Authorizing Provider  benzonatate (TESSALON) 100 MG capsule Take 1 capsule (100 mg total) by mouth every 8 (eight) hours. 03/25/21  Yes Onix Jumper, MD  ALPRAZolam Duanne Moron) 0.25 MG tablet Take 0.25 mg by mouth 3 (three) times daily as needed for anxiety. For anxiety.    [provider]  fluticasone (FLONASE) 50 MCG/ACT nasal spray Place 1 spray into both nostrils daily as needed for allergies or rhinitis.    [provider]  MAGNESIUM PO Take 1 tablet by mouth daily.    [provider]  Nutritional Supplements (JUICE PLUS FIBRE PO) Take by mouth.    [provider]  omeprazole (PRILOSEC) 40 MG capsule Take 40 mg by mouth daily as needed (acid reflux).  03/07/14   [provider]  Polyethyl Glycol-Propyl Glycol (SYSTANE OP) Apply 1 drop to eye 3 (three) times daily as needed  (dry eyes).    [provider]  potassium gluconate 595 (99 K) MG TABS tablet Take 595 mg by mouth. 2 tabs qhs    [provider]  Probiotic Product (PROBIOTIC PO) Take by mouth.    [provider]  rosuvastatin (CRESTOR) 5 MG tablet Take 5 mg by mouth at bedtime.    [provider]  triamcinolone cream (KENALOG) 0.1 % Apply 1 application topically daily as needed (itching.).    [provider]  triamterene-hydrochlorothiazide (MAXZIDE-25) 37.5-25 MG per tablet Take 1 tablet by mouth daily. 03/01/14   [provider]  VITAMIN D PO Take by mouth.    [provider]      Allergies    Mobic [meloxicam], Robaxin [methocarbamol], and Nitrofurantoin monohyd macro    Review of Systems   Review of Systems  HENT:  Negative for facial swelling and sore throat.   Eyes:  Negative for redness.  Respiratory:  Positive for cough. Negative for shortness of breath, wheezing and stridor.   Cardiovascular:  Negative for chest pain.  Gastrointestinal:  Negative for diarrhea, nausea and vomiting.  Musculoskeletal:  Positive for myalgias.  Neurological:  Negative for headaches.  Psychiatric/Behavioral:  Negative for agitation.   All other systems reviewed and are negative.  Physical Exam Updated Vital Signs BP 124/67    Pulse 97    Temp 100.3 F (37.9 C) (Oral)    Resp 18    Ht 5\' 3"  (1.6 m)    Wt 74.8 kg  SpO2 94%    BMI 29.23 kg/m  Physical Exam Vitals and nursing note reviewed.  Constitutional:      General: She is not in acute distress.    Appearance: Normal appearance.  HENT:     Head: Normocephalic and atraumatic.     Nose: Congestion present.  Eyes:     Conjunctiva/sclera: Conjunctivae normal.     Pupils: Pupils are equal, round, and reactive to light.  Cardiovascular:     Rate and Rhythm: Normal rate and regular rhythm.     Pulses: Normal pulses.     Heart sounds: Normal heart sounds.  Pulmonary:     Effort: Pulmonary  effort is normal. No respiratory distress.     Breath sounds: Normal breath sounds. No stridor. No wheezing, rhonchi or rales.  Chest:     Chest wall: No tenderness.  Abdominal:     General: Abdomen is flat. Bowel sounds are normal.     Palpations: Abdomen is soft.     Tenderness: There is no abdominal tenderness. There is no guarding.  Musculoskeletal:        General: Normal range of motion.     Cervical back: Normal range of motion and neck supple.  Skin:    General: Skin is warm and dry.     Capillary Refill: Capillary refill takes less than 2 seconds.  Neurological:     General: No focal deficit present.     Mental Status: She is alert and oriented to person, place, and time.     Deep Tendon Reflexes: Reflexes normal.  Psychiatric:        Mood and Affect: Mood normal.        Behavior: Behavior normal.    ED Results / Procedures / Treatments   Labs (all labs ordered are listed, but only abnormal results are displayed) Results for orders placed or performed during the hospital encounter of 03/25/21  Resp Panel by RT-PCR (Flu A&B, Covid) Nasopharyngeal Swab   Specimen: Nasopharyngeal Swab; Nasopharyngeal(NP) swabs in vial transport medium  Result Value Ref Range   SARS Coronavirus 2 by RT PCR NEGATIVE NEGATIVE   Influenza A by PCR NEGATIVE NEGATIVE   Influenza B by PCR NEGATIVE NEGATIVE   DG Chest Portable 1 View  Result Date: 03/25/2021 CLINICAL DATA:  Presents with cough. EXAM: PORTABLE CHEST 1 VIEW COMPARISON:  None. FINDINGS: The heart size and mediastinal contours are within normal limits. There is patchy calcification of the proximal aorta. Both lungs are slightly emphysematous but clear. The visualized skeletal structures are unremarkable. IMPRESSION: No active disease.  COPD.  Aortic atherosclerosis. Electronically Signed   By: Telford Nab M.D.   On: 03/25/2021 03:54    Radiology DG Chest Portable 1 View  Result Date: 03/25/2021 CLINICAL DATA:  Presents with  cough. EXAM: PORTABLE CHEST 1 VIEW COMPARISON:  None. FINDINGS: The heart size and mediastinal contours are within normal limits. There is patchy calcification of the proximal aorta. Both lungs are slightly emphysematous but clear. The visualized skeletal structures are unremarkable. IMPRESSION: No active disease.  COPD.  Aortic atherosclerosis. Electronically Signed   By: Telford Nab M.D.   On: 03/25/2021 03:54    Procedures Procedures    Medications Ordered in ED Medications  acetaminophen (TYLENOL) tablet 1,000 mg (has no administration in time range)  benzonatate (TESSALON) capsule 200 mg (has no administration in time range)    ED Course/ Medical Decision Making/ A&P  Medical Decision Making Cough, congestion headache and body aches for one week and grand son with same.  Seen and given steroids and flonase without resolution of symptoms   Amount and/or Complexity of Data Reviewed External Data Reviewed: notes. Labs: ordered.    Details: covid and flu are negative Radiology: ordered.    Details: CXR is normal by my read, radiology agrees  Risk OTC drugs. Prescription drug management. Risk Details: Patient is well appearing.  Patient has a grand child with same and was with them.  Patient with flu like illness that is not influenza.  No PNA on exam or CXR.  Alternating tylenol and ibuprofen for symptoms.  I have sent RX for tessalon to pharmacy for cough.  Continue fluids.      Final Clinical Impression(s) / ED Diagnoses Final diagnoses:  Viral URI with cough   Return for intractable cough, coughing up blood, fevers > 100.4 unrelieved by medication, shortness of breath, intractable vomiting, chest pain, shortness of breath, weakness, numbness, changes in speech, facial asymmetry, abdominal pain, passing out, Inability to tolerate liquids or food, cough, altered mental status or any concerns. No signs of systemic illness or infection. The patient is  nontoxic-appearing on exam and vital signs are within normal limits.  I have reviewed the triage vital signs and the nursing notes. Pertinent labs & imaging results that were available during my care of the patient were reviewed by me and considered in my medical decision making (see chart for details). After history, exam, and medical workup I feel the patient has been appropriately medically screened and is safe for discharge home. Pertinent diagnoses were discussed with the patient. Patient was given return precautions.      Rx / DC Orders ED Discharge Orders          Ordered    benzonatate (TESSALON) 100 MG capsule  Every 8 hours        03/25/21 0427              Lorenia Hoston, MD 03/25/21 5790

## 2021-03-25 NOTE — ED Notes (Signed)
Pt verbalizes understanding of discharge instructions. Opportunity for questioning and answers were provided. Pt discharged from ED to home.   ? ?

## 2021-04-01 ENCOUNTER — Ambulatory Visit: Payer: Medicare PPO | Attending: Nurse Practitioner | Admitting: Physical Therapy

## 2021-04-01 ENCOUNTER — Other Ambulatory Visit: Payer: Self-pay

## 2021-04-01 DIAGNOSIS — M6281 Muscle weakness (generalized): Secondary | ICD-10-CM | POA: Insufficient documentation

## 2021-04-01 DIAGNOSIS — R278 Other lack of coordination: Secondary | ICD-10-CM | POA: Diagnosis present

## 2021-04-01 DIAGNOSIS — R293 Abnormal posture: Secondary | ICD-10-CM | POA: Insufficient documentation

## 2021-04-01 NOTE — Therapy (Signed)
[facility] @ [place], [place], [address] Phone: [phone]   Fax:  [phone]  Physical Therapy Treatment  Patient Details  Name: Kayla Stokes MRN: [id number] Date of Birth: [birth year] Referring Provider (PT): Tamela Gammon, NP   Encounter Date: 04/01/2021   PT End of Session - 04/01/21 1135     Visit Number 6    Date for PT Re-Evaluation 05/14/21    Authorization Type [employer]    PT Start Time 1046    PT Stop Time 1130    PT Time Calculation (min) 44 min    Activity Tolerance Patient tolerated treatment well    Behavior During Therapy [facility] for tasks assessed/performed             Past Medical History:  Diagnosis Date   Allergic rhinitis    Arthritis    finger   Bursitis of right hip    Complication of anesthesia    difficult to awaken after gallbladder surgery   Dental crowns present    Eczema    elbow   GERD (gastroesophageal reflux disease)    H. pylori infection    History of hiatal hernia    Hypertension    under control, has been on med. x 10 yrs.   Mass of hand 05/2011   right   Measles    hx of    Mumps    hx of    Osteopenia 11/2012   T score -1.1 left forearm FRAX 6.2%/0.1%   Pancreatitis    Rubella    hx of    Trochanteric bursitis of left hip     Past Surgical History:  Procedure Laterality Date   ABDOMINAL HYSTERECTOMY     ABLATION SAPHENOUS VEIN W/ RFA  10-2011   CATARACT EXTRACTION W/ INTRAOCULAR LENS  IMPLANT, BILATERAL  09/2010, 01/2011   bilateral   CHOLECYSTECTOMY  MAY 2014   CHONDROPLASTY  02/12/2011   Procedure: CHONDROPLASTY;  Surgeon: Gearlean Alf;  Location: [place];  Service: Orthopedics;  Laterality: Left;   EYE SURGERY     laser right eye; due to right eye hemorrhage; had left eye retinal tear with subsequent surgery   KNEE ARTHROSCOPY  02/12/2011   Procedure: ARTHROSCOPY KNEE;  Surgeon: Gearlean Alf;  Location: [place];  Service: Orthopedics;  Laterality: Left;   KNEE SURGERY  2021   MASS EXCISION  06/05/2011   Procedure: EXCISION MASS;  Surgeon: Linna Hoff, MD;  Location: [place];  Service: Orthopedics;  Laterality: Right;  right thumb   MENISCUS DEBRIDEMENT  02/12/2011   Procedure: DEBRIDEMENT OF MENISCUS;  Surgeon: Gearlean Alf;  Location: [place];  Service: Orthopedics;  Laterality: Left;   MOUTH SURGERY     implants    stent placement after cholecestectomy     TOTAL KNEE ARTHROPLASTY Left 05/22/2014   Procedure: LEFT TOTAL KNEE ARTHROPLASTY;  Surgeon: Gaynelle Arabian, MD;  Location: [facility];  Service: Orthopedics;  Laterality: Left;   TUBAL LIGATION     VAGINAL HYSTERECTOMY WITH A&P REPAIR  1989   VARICOSE VEIN SURGERY  02/13/2003   ligation and stripping left greater saphenous; exc.  multiple varicosities    There were no vitals filed for this visit.   Subjective Assessment - 04/01/21 1053     Subjective Pt reports she missed last visit due to having a strong cough and feeling terrible though tested (-)  for COVID but had incredibly strong cough for 10 days without rest and had large losses of bladder throughout this time. Prior to getting sick she was seeing great improvement with leakage and felt like she had more control.    Pertinent History TVH with A & P repair 1989,    How long can you sit comfortably? no limits    How long can you stand comfortably? no limits    How long can you walk comfortably? no limits    Currently in Pain? No/denies                            Pelvic Floor Special Questions - 04/01/21 0001     Pelvic Floor Internal Exam patient identified and patient confirms consent for PT to perform internal soft tissue work and muscle strength and integrity assessment    Exam Type Vaginal    Sensation WFL    Palpation mild TTP Rt pubococcygeus and iliococcygeus    Strength fair squeeze, definite lift     Strength # of reps 6    Strength # of seconds 4    Tone improving               [facility] Adult PT Treatment/Exercise - 04/01/21 0001       Neuro Re-ed    Neuro Re-ed Details  internal pelvic floor vaginally: 2x10 pelvic floor contractions, 8L37 quick flicks, 2x5 8s isometric holds. Pt did benefit from quick release technique to improve contraction strength and technique with verbal cues as well. Overall, pt demonstrated improved strength and ability to activate/isolate pelvic floor with limited to no compensatory strategies. Without internal feedback: pt directed in additional x10 pelvic floor contractions, D42 quick flicks, and x5 8s holds to assess and allow pt ability to self assess as well her ability to contract without feedback. Pt reported she was able to feel all contractions consistently and only required minimal cues for technique for upward pull and tightening.                     PT Education - 04/01/21 1137     Education Details Pt educated on contracting pelvic floor with knack method for coughing to decrease strain downward    Person(s) Educated Patient    Methods Explanation;Demonstration;Tactile cues;Verbal cues    Comprehension Returned demonstration;Verbalized understanding              PT Short Term Goals - 03/04/21 1047       PT SHORT TERM GOAL #1   Title Pt to be I with HEP    Time 5    Period Weeks    Status Achieved    Target Date 02/18/21      PT SHORT TERM GOAL #2   Title pt to demonstrate at least 3/5 pelvic floor strength for decreased leakage symptoms.    Time 5    Period Weeks    Status Achieved    Target Date 02/18/21      PT SHORT TERM GOAL #3   Title pt to report no more than one urinary leak per day to improve QOL and skin integrity at vulva.    Time 5    Period Weeks    Status Achieved    Target Date 02/18/21      PT SHORT TERM GOAL #4   Title pt to recall and demonsrate good recall of voiding mechanics to decrease  straining at pelvic floor and prolapse.    Time 5    Period Weeks    Status Achieved    Target Date 02/18/21               PT Long Term Goals - 03/04/21 1047       PT LONG TERM GOAL #1   Title Pt to be I with advanced HEP    Time 4    Period Months    Status On-going    Target Date 05/14/21      PT LONG TERM GOAL #2   Title pt to demonstrate at least 4/5 pelvic floor strength for decreased leakage symptoms    Time 4    Period Months    Status On-going    Target Date 05/14/21      PT LONG TERM GOAL #3   Title pt to report no more than one urinary leak per week to improve QOL and skin integrity at vulva.    Time 4    Period Months    Status On-going    Target Date 05/14/21      PT LONG TERM GOAL #4   Title pt to demonstrate improved bil hip stength to 5/5 throughout to improve pelvic stability    Time 4    Period Months    Status On-going    Target Date 05/14/21                   Plan - 04/01/21 1139     Clinical Impression Statement Pt presents to clinic reporting improved symptoms until she got sick. Pt tested (-) for COVID but had 10 days of severe cough with production. Pt reports she was started on medication after seeing her doctor and has been starting to feel better but with sickness and strong constant coughin did have large losses of bladder and felt upset by this. Pt consented to internal treatment this date, was found to have increased strength compared to last internal treatment and with ability to complete reps and at least 4s with this 4/5 improved strength. Pt educated on these findings and directed in pelvic floor strengthening, endurance and coordination exercises with NMRE for improved activiation and isolation of pelvic floor with intermittent use of quick release to complete. Pt benefited from less cuing and then directed in these exercises without internal feedback from PT and pt reports she was able to feel each contraction consistently.  Pt also educated during treatment to attempt to use knack method with coughing to decrease leakage and improve pelvic floor support to decrease strain as well. Pt reported she understood this and denied questions. Pt continues to progress toward goals and has met STG this session and continues demo need to met LTG.    Personal Factors and Comorbidities Comorbidity 1;Fitness    Comorbidities x3 vaginal births, one tearing (with first) into EAS per pt    Examination-Activity Limitations Continence;Hygiene/Grooming;Squat;Caring for Others;Transfers;Lift    Examination-Participation Restrictions Community Activity;Driving;Interpersonal Relationship;Shop    Stability/Clinical Decision Making Evolving/Moderate complexity    Rehab Potential Good    PT Frequency 1x / week    PT Duration Other (comment)   10 weeks   PT Treatment/Interventions ADLs/Self Care Home Management;Aquatic Therapy;Functional mobility training;Therapeutic activities;Therapeutic exercise;Neuromuscular re-education;Manual techniques;Patient/family education;Scar mobilization;Passive range of motion;Energy conservation;Taping    PT Next Visit Plan hip and core strengthening and stretching with coordination of breathing/core/PF    PT Home Exercise Plan [id number]    Consulted and Agree  with Plan of Care Patient             Patient will benefit from skilled therapeutic intervention in order to improve the following deficits and impairments:  Decreased endurance, Decreased coordination, Improper body mechanics, Impaired flexibility, Decreased scar mobility, Decreased mobility, Decreased strength, Postural dysfunction  Visit Diagnosis: Muscle weakness (generalized)  Other lack of coordination     Problem List Patient Active Problem List   Diagnosis Date Noted   Impingement syndrome of right shoulder region 03/08/2020   Lumbar radiculopathy 11/04/2019   Trochanteric bursitis of left hip 09/12/2019   History of prosthetic  unicompartmental arthroplasty of right knee 04/05/2019   Low back pain 11/29/2018   Family history of gastric cancer 08/10/2018   Epigastric abdominal pain 08/10/2018   Pain in right knee 07/23/2017   Noise effect on both inner ears 07/22/2017   Presbycusis of both ears 07/22/2017   Stage 3 chronic kidney disease ([place]) 06/29/2017   Diverticulitis 11/04/2016   Benign essential hypertension 11/17/2015   OA (osteoarthritis) of knee 05/22/2014   Colon polyps 06/21/2012   Diverticulosis 06/21/2012   Fluid retention 06/21/2012   History of pancreatitis 05/17/2012   H. pylori infection    Varicose veins of lower extremities with other complications 96/72/2773   GASTRITIS 02/07/2010   Hyperlipidemia 01/16/2010   Anxiety state 01/16/2010   Allergic rhinitis 01/16/2010   DIVERTICULAR DISEASE 01/16/2010   Constipation 01/16/2010   NAUSEA AND VOMITING 01/16/2010   PERSONAL HX COLONIC POLYPS 01/16/2010   No emotional/communication barriers or cognitive limitation. Patient is motivated to learn. Patient understands and agrees with treatment goals and plan. PT explains patient will be examined in standing, sitting, and lying down to see how their muscles and joints work. When they are ready, they will be asked to remove their underwear so PT can examine their perineum. The patient is also given the option of providing their own chaperone as one is not provided in our facility. The patient also has the right and is explained the right to defer or refuse any part of the evaluation or treatment including the internal exam. With the patient's consent, PT will use one gloved finger to gently assess the muscles of the pelvic floor, seeing how well it contracts and relaxes and if there is muscle symmetry. After, the patient will get dressed and PT and patient will discuss exam findings and plan of care. PT and patient discuss plan of care, schedule, attendance policy and HEP activities.   Stacy Gardner, PT,  DPT 04/01/2309:44 AM   [facility] @ [place], [place], [address] Phone: [phone]   Fax:  [phone]  Name: Kayla Stokes MRN: [id number] Date of Birth: May 16, 1945

## 2021-04-02 DIAGNOSIS — M5030 Other cervical disc degeneration, unspecified cervical region: Secondary | ICD-10-CM | POA: Insufficient documentation

## 2021-04-08 ENCOUNTER — Ambulatory Visit: Payer: Medicare PPO | Admitting: Physical Therapy

## 2021-04-08 ENCOUNTER — Other Ambulatory Visit: Payer: Self-pay

## 2021-04-08 DIAGNOSIS — M6281 Muscle weakness (generalized): Secondary | ICD-10-CM

## 2021-04-08 DIAGNOSIS — R278 Other lack of coordination: Secondary | ICD-10-CM

## 2021-04-08 NOTE — Therapy (Signed)
[facility] @ [place], [place], [address] Phone: [phone]   Fax:  [phone]  Physical Therapy Treatment  Patient Details  Name: Kayla Stokes MRN: [id number] Date of Birth: 27-Jun-1945 Referring Provider (PT): Tamela Gammon, NP   Encounter Date: 04/08/2021   PT End of Session - 04/08/21 1106     Visit Number 7    Date for PT Re-Evaluation 05/14/21    Authorization Type [employer]    PT Start Time 1102    PT Stop Time 1143    PT Time Calculation (min) 41 min    Activity Tolerance Patient tolerated treatment well    Behavior During Therapy [facility] for tasks assessed/performed             Past Medical History:  Diagnosis Date   Allergic rhinitis    Arthritis    finger   Bursitis of right hip    Complication of anesthesia    difficult to awaken after gallbladder surgery   Dental crowns present    Eczema    elbow   GERD (gastroesophageal reflux disease)    H. pylori infection    History of hiatal hernia    Hypertension    under control, has been on med. x 10 yrs.   Mass of hand 05/2011   right   Measles    hx of    Mumps    hx of    Osteopenia 11/2012   T score -1.1 left forearm FRAX 6.2%/0.1%   Pancreatitis    Rubella    hx of    Trochanteric bursitis of left hip     Past Surgical History:  Procedure Laterality Date   ABDOMINAL HYSTERECTOMY     ABLATION SAPHENOUS VEIN W/ RFA  10-2011   CATARACT EXTRACTION W/ INTRAOCULAR LENS  IMPLANT, BILATERAL  09/2010, 01/2011   bilateral   CHOLECYSTECTOMY  MAY 2014   CHONDROPLASTY  02/12/2011   Procedure: CHONDROPLASTY;  Surgeon: Gearlean Alf;  Location: [place];  Service: Orthopedics;  Laterality: Left;   EYE SURGERY     laser right eye; due to right eye hemorrhage; had left eye retinal tear with subsequent surgery   KNEE ARTHROSCOPY  02/12/2011   Procedure: ARTHROSCOPY KNEE;  Surgeon: Gearlean Alf;  Location:  [place];  Service: Orthopedics;  Laterality: Left;   KNEE SURGERY  2021   MASS EXCISION  06/05/2011   Procedure: EXCISION MASS;  Surgeon: Linna Hoff, MD;  Location: [place];  Service: Orthopedics;  Laterality: Right;  right thumb   MENISCUS DEBRIDEMENT  02/12/2011   Procedure: DEBRIDEMENT OF MENISCUS;  Surgeon: Gearlean Alf;  Location: [place];  Service: Orthopedics;  Laterality: Left;   MOUTH SURGERY     implants    stent placement after cholecestectomy     TOTAL KNEE ARTHROPLASTY Left 05/22/2014   Procedure: LEFT TOTAL KNEE ARTHROPLASTY;  Surgeon: Gaynelle Arabian, MD;  Location: [facility];  Service: Orthopedics;  Laterality: Left;   TUBAL LIGATION     VAGINAL HYSTERECTOMY WITH A&P REPAIR  1989   VARICOSE VEIN SURGERY  02/13/2003   ligation and stripping left greater saphenous; exc.  multiple varicosities    There were no vitals filed for this visit.   Subjective Assessment - 04/08/21 1107     Subjective Pt reports she is very pleased with progress and no longer having leakage, unless coughing and now  able to fully empty when going to urinate also reports when she has a full bladder she can now feel tightening and able to make it to the bathroom without leakage. Pt also has been implementing knack method which has been helpful.    Pertinent History TVH with A & P repair 1989,    How long can you sit comfortably? no limits    How long can you stand comfortably? no limits    How long can you walk comfortably? no limits    Currently in Pain? No/denies                               [facility] Adult PT Treatment/Exercise - 04/08/21 0001       Exercises   Exercises Lumbar;Knee/Hip      Lumbar Exercises: Aerobic   Elliptical 5 mins L1 with cues for core and pelvic floor activation and breathing mechanics      Lumbar Exercises: Standing   Functional Squats 20 reps    Functional Squats Limitations 3# DB each     Other Standing Lumbar Exercises palloffs green band 2x10 each way; rotational palloffs green band 2x10    Other Standing Lumbar Exercises mario punches on Lt UE and RT LE 3# and ony hip flexion on LT LE 2x10 each      Knee/Hip Exercises: Standing   Forward Lunges Right;Left;10 reps    Forward Lunges Limitations coordinating pelvic floor and breathing mechanics                     PT Education - 04/08/21 1141     Education Details Pt educated on coordinating with pelvic floor and breathing with all exercises today    Person(s) Educated Patient    Methods Explanation;Demonstration;Tactile cues;Verbal cues    Comprehension Returned demonstration;Verbalized understanding              PT Short Term Goals - 03/04/21 1047       PT SHORT TERM GOAL #1   Title Pt to be I with HEP    Time 5    Period Weeks    Status Achieved    Target Date 02/18/21      PT SHORT TERM GOAL #2   Title pt to demonstrate at least 3/5 pelvic floor strength for decreased leakage symptoms.    Time 5    Period Weeks    Status Achieved    Target Date 02/18/21      PT SHORT TERM GOAL #3   Title pt to report no more than one urinary leak per day to improve QOL and skin integrity at vulva.    Time 5    Period Weeks    Status Achieved    Target Date 02/18/21      PT SHORT TERM GOAL #4   Title pt to recall and demonsrate good recall of voiding mechanics to decrease straining at pelvic floor and prolapse.    Time 5    Period Weeks    Status Achieved    Target Date 02/18/21               PT Long Term Goals - 03/04/21 1047       PT LONG TERM GOAL #1   Title Pt to be I with advanced HEP    Time 4    Period Months    Status On-going    Target Date 05/14/21  PT LONG TERM GOAL #2   Title pt to demonstrate at least 4/5 pelvic floor strength for decreased leakage symptoms    Time 4    Period Months    Status On-going    Target Date 05/14/21      PT LONG TERM GOAL #3   Title  pt to report no more than one urinary leak per week to improve QOL and skin integrity at vulva.    Time 4    Period Months    Status On-going    Target Date 05/14/21      PT LONG TERM GOAL #4   Title pt to demonstrate improved bil hip stength to 5/5 throughout to improve pelvic stability    Time 4    Period Months    Status On-going    Target Date 05/14/21                   Plan - 04/08/21 1142     Clinical Impression Statement Pt reports continued improvment with symptoms, and very pleased with only having leakage now with coughing. Pt reports she is able to effectively hold urine with strong urge to make it to bathroom without leakage and she feels like she fully able to empty now with urine which has been very helpful too. Pt session focused on coordinating pelvic floor and breathing with strenthening exercises for improved pelvic floor activation and strength and control for decreased leakage. Pt tolerated well with brief rest breaks, had progressed into increased resistance intermittently, including lunges and elliptical this session, no leakage. Pt continues to progress toward goals and has met STG this session and continues demo need to met LTG.    Personal Factors and Comorbidities Comorbidity 1;Fitness    Comorbidities x3 vaginal births, one tearing (with first) into EAS per pt    Examination-Activity Limitations Continence;Hygiene/Grooming;Squat;Caring for Others;Transfers;Lift    Examination-Participation Restrictions Community Activity;Driving;Interpersonal Relationship;Shop    Stability/Clinical Decision Making Evolving/Moderate complexity    Rehab Potential Good    PT Frequency 1x / week    PT Duration Other (comment)   10 weeks   PT Treatment/Interventions ADLs/Self Care Home Management;Aquatic Therapy;Functional mobility training;Therapeutic activities;Therapeutic exercise;Neuromuscular re-education;Manual techniques;Patient/family education;Scar  mobilization;Passive range of motion;Energy conservation;Taping    PT Next Visit Plan hip and core strengthening and stretching with coordination of breathing/core/PF    PT Home Exercise Plan [id number]    Consulted and Agree with Plan of Care Patient             Patient will benefit from skilled therapeutic intervention in order to improve the following deficits and impairments:  Decreased endurance, Decreased coordination, Improper body mechanics, Impaired flexibility, Decreased scar mobility, Decreased mobility, Decreased strength, Postural dysfunction  Visit Diagnosis: Muscle weakness (generalized)  Other lack of coordination     Problem List Patient Active Problem List   Diagnosis Date Noted   Impingement syndrome of right shoulder region 03/08/2020   Lumbar radiculopathy 11/04/2019   Trochanteric bursitis of left hip 09/12/2019   History of prosthetic unicompartmental arthroplasty of right knee 04/05/2019   Low back pain 11/29/2018   Family history of gastric cancer 08/10/2018   Epigastric abdominal pain 08/10/2018   Pain in right knee 07/23/2017   Noise effect on both inner ears 07/22/2017   Presbycusis of both ears 07/22/2017   Stage 3 chronic kidney disease ([place]) 06/29/2017   Diverticulitis 11/04/2016   Benign essential hypertension 11/17/2015   OA (osteoarthritis) of knee 05/22/2014   Colon polyps  06/21/2012   Diverticulosis 06/21/2012   Fluid retention 06/21/2012   History of pancreatitis 05/17/2012   H. pylori infection    Varicose veins of lower extremities with other complications 02/54/2706   GASTRITIS 02/07/2010   Hyperlipidemia 01/16/2010   Anxiety state 01/16/2010   Allergic rhinitis 01/16/2010   DIVERTICULAR DISEASE 01/16/2010   Constipation 01/16/2010   NAUSEA AND VOMITING 01/16/2010   PERSONAL HX COLONIC POLYPS 01/16/2010    Stacy Gardner, PT, DPT 04/08/2309:46 AM   [facility] @ [facility]  [place], [place], [address] Phone: [phone]   Fax:  [phone]  Name: Kayla Stokes MRN: [id number] Date of Birth: 11-Jan-1946

## 2021-04-15 ENCOUNTER — Encounter: Payer: Self-pay | Admitting: Physical Therapy

## 2021-04-15 ENCOUNTER — Other Ambulatory Visit: Payer: Self-pay

## 2021-04-15 ENCOUNTER — Ambulatory Visit: Payer: Medicare PPO | Admitting: Physical Therapy

## 2021-04-15 DIAGNOSIS — R293 Abnormal posture: Secondary | ICD-10-CM

## 2021-04-15 DIAGNOSIS — R278 Other lack of coordination: Secondary | ICD-10-CM

## 2021-04-15 DIAGNOSIS — M6281 Muscle weakness (generalized): Secondary | ICD-10-CM | POA: Diagnosis not present

## 2021-04-15 NOTE — Therapy (Signed)
[place] @ [facility] [place], [place], [address] Phone: [phone]   Fax:  [phone]  Physical Therapy Treatment  Patient Details  Name: Kayla Stokes MRN: [id number] Date of Birth: 01/11/1946 Referring Provider (PT): Tamela Gammon, NP   Encounter Date: 04/15/2021   PT End of Session - 04/15/21 1104     Visit Number 8    Date for PT Re-Evaluation 05/14/21    Authorization Type [employer]    PT Start Time 1100    PT Stop Time 1143    PT Time Calculation (min) 43 min    Activity Tolerance Patient tolerated treatment well    Behavior During Therapy [facility] for tasks assessed/performed             Past Medical History:  Diagnosis Date   Allergic rhinitis    Arthritis    finger   Bursitis of right hip    Complication of anesthesia    difficult to awaken after gallbladder surgery   Dental crowns present    Eczema    elbow   GERD (gastroesophageal reflux disease)    H. pylori infection    History of hiatal hernia    Hypertension    under control, has been on med. x 10 yrs.   Mass of hand 05/2011   right   Measles    hx of    Mumps    hx of    Osteopenia 11/2012   T score -1.1 left forearm FRAX 6.2%/0.1%   Pancreatitis    Rubella    hx of    Trochanteric bursitis of left hip     Past Surgical History:  Procedure Laterality Date   ABDOMINAL HYSTERECTOMY     ABLATION SAPHENOUS VEIN W/ RFA  10-2011   CATARACT EXTRACTION W/ INTRAOCULAR LENS  IMPLANT, BILATERAL  09/2010, 01/2011   bilateral   CHOLECYSTECTOMY  MAY 2014   CHONDROPLASTY  02/12/2011   Procedure: CHONDROPLASTY;  Surgeon: Gearlean Alf;  Location: [place];  Service: Orthopedics;  Laterality: Left;   EYE SURGERY     laser right eye; due to right eye hemorrhage; had left eye retinal tear with subsequent surgery   KNEE ARTHROSCOPY  02/12/2011   Procedure: ARTHROSCOPY KNEE;  Surgeon: Gearlean Alf;  Location:  [place];  Service: Orthopedics;  Laterality: Left;   KNEE SURGERY  2021   MASS EXCISION  06/05/2011   Procedure: EXCISION MASS;  Surgeon: Linna Hoff, MD;  Location: [place];  Service: Orthopedics;  Laterality: Right;  right thumb   MENISCUS DEBRIDEMENT  02/12/2011   Procedure: DEBRIDEMENT OF MENISCUS;  Surgeon: Gearlean Alf;  Location: [place];  Service: Orthopedics;  Laterality: Left;   MOUTH SURGERY     implants    stent placement after cholecestectomy     TOTAL KNEE ARTHROPLASTY Left 05/22/2014   Procedure: LEFT TOTAL KNEE ARTHROPLASTY;  Surgeon: Gaynelle Arabian, MD;  [facility]: [facility];  Service: Orthopedics;  Laterality: Left;   TUBAL LIGATION     VAGINAL HYSTERECTOMY WITH A&P REPAIR  1989   VARICOSE VEIN SURGERY  02/13/2003   ligation and stripping left greater saphenous; exc.  multiple varicosities    There were no vitals filed for this visit.   Subjective Assessment - 04/15/21 1128     Subjective Pt reports her prolapse symptoms are "way better" no  longer feeling pressure or heaviness now. Pt  also reports she is having less leakage and smaller amounts when she does now with only leakage felt when she waits longer than "several hours" without going to the bathroom then waiting too long. Pt reports is also only going 1x per night to urinate.   ° Pertinent History TVH with A & P repair 1989,   ° How long can you sit comfortably? no limits   ° How long can you stand comfortably? no limits   ° How long can you walk comfortably? no limits   ° Currently in Pain? No/denies   ° °  °  ° °  ° ° ° ° ° ° ° ° ° ° ° ° ° ° ° ° ° Pelvic Floor Special Questions - 04/15/21 0001   ° ° Pelvic Floor Internal Exam patient identified and patient confirms consent for PT to perform internal soft tissue work and muscle strength and integrity assessment   ° Exam Type Vaginal   ° Sensation WFL   ° Palpation no TTP   ° Strength good squeeze, good lift, able to  hold agaisnt strong resistance   ° Strength # of reps 10   ° Strength # of seconds 8   ° Tone WFL   ° °  °  ° °  ° ° ° ° [facility] Adult PT Treatment/Exercise - 04/15/21 0001   ° °  ° Manual Therapy  ° Manual Therapy Internal Pelvic Floor   ° Internal Pelvic Floor Pt directed in 1x10 contractions/relaxions; 1x10 quick flicks; and x3 10s holds. minimal cues for visualization for improved contraction and isolation. no downward pressure noted with any attempts. Pt then directed in additional 2x10 pelvic floor contractions, x10 quick flicks and x7 10s isometics without internal manual feedback with therapeutic exercises for improved pt independence with strengthening.   ° °  °  ° °  ° ° ° ° ° ° ° ° ° ° PT Education - 04/15/21 1131   ° ° Education Details pt educated on continuing HEP for pelvic floor strengthening and coordinating pressure management with activities.   ° Person(s) Educated Patient   ° Methods Explanation;Demonstration;Tactile cues;Verbal cues   ° Comprehension Returned demonstration;Verbalized understanding   ° °  °  ° °  ° ° ° PT Short Term Goals - 03/04/21 1047   ° °  ° PT SHORT TERM GOAL #1  ° Title Pt to be I with HEP   ° Time 5   ° Period Weeks   ° Status Achieved   ° Target Date 02/18/21   °  ° PT SHORT TERM GOAL #2  ° Title pt to demonstrate at least 3/5 pelvic floor strength for decreased leakage symptoms.   ° Time 5   ° Period Weeks   ° Status Achieved   ° Target Date 02/18/21   °  ° PT SHORT TERM GOAL #3  ° Title pt to report no more than one urinary leak per day to improve QOL and skin integrity at vulva.   ° Time 5   ° Period Weeks   ° Status Achieved   ° Target Date 02/18/21   °  ° PT SHORT TERM GOAL #4  ° Title pt to recall and demonsrate good recall of voiding mechanics to decrease straining at pelvic floor and prolapse.   ° Time 5   ° Period Weeks   ° Status Achieved   ° Target Date 02/18/21   ° °  °  ° °  ° ° ° °   PT Long Term Goals - 03/04/21 1047       PT LONG TERM GOAL #1   Title Pt to  be I with advanced HEP    Time 4    Period Months    Status On-going    Target Date 05/14/21      PT LONG TERM GOAL #2   Title pt to demonstrate at least 4/5 pelvic floor strength for decreased leakage symptoms    Time 4    Period Months    Status On-going    Target Date 05/14/21      PT LONG TERM GOAL #3   Title pt to report no more than one urinary leak per week to improve QOL and skin integrity at vulva.    Time 4    Period Months    Status On-going    Target Date 05/14/21      PT LONG TERM GOAL #4   Title pt to demonstrate improved bil hip stength to 5/5 throughout to improve pelvic stability    Time 4    Period Months    Status On-going    Target Date 05/14/21                   Plan - 04/15/21 1146     Clinical Impression Statement Pt presents to clinic reporting she feels ~80% better since starting PT, has noticed decreased symptoms overall and very pleased so far. Pt session focused on internal vaginal pelvic floor treatment with PT providing cues and feedback for first few reps of exercises with minimal cues needed and then pt instructed to completed additional reps without internal feedback and pt reported she feels better that she is doing them correctly. Pt tolerated session well without pain and reports she has been doing better with coordinating pelvic floor and pressure management for activities at home which has helped. Pt continues to progress toward goals and has met STG this session and continues demo need to met LTG.    Personal Factors and Comorbidities Comorbidity 1;Fitness    Comorbidities x3 vaginal births, one tearing (with first) into EAS per pt    Examination-Activity Limitations Continence;Hygiene/Grooming;Squat;Caring for Others;Transfers;Lift    Examination-Participation Restrictions Community Activity;Driving;Interpersonal Relationship;Shop    Stability/Clinical Decision Making Evolving/Moderate complexity    Rehab Potential Good    PT  Frequency 1x / week    PT Duration Other (comment)   10 weeks   PT Treatment/Interventions ADLs/Self Care Home Management;Aquatic Therapy;Functional mobility training;Therapeutic activities;Therapeutic exercise;Neuromuscular re-education;Manual techniques;Patient/family education;Scar mobilization;Passive range of motion;Energy conservation;Taping    PT Next Visit Plan hip and core strengthening and stretching with coordination of breathing/core/PF    PT Home Exercise Plan [id number]    Consulted and Agree with Plan of Care Patient             Patient will benefit from skilled therapeutic intervention in order to improve the following deficits and impairments:  Decreased endurance, Decreased coordination, Improper body mechanics, Impaired flexibility, Decreased scar mobility, Decreased mobility, Decreased strength, Postural dysfunction  Visit Diagnosis: Abnormal posture  Muscle weakness (generalized)  Other lack of coordination     Problem List Patient Active Problem List   Diagnosis Date Noted   Impingement syndrome of right shoulder region 03/08/2020   Lumbar radiculopathy 11/04/2019   Trochanteric bursitis of left hip 09/12/2019   History of prosthetic unicompartmental arthroplasty of right knee 04/05/2019   Low back pain 11/29/2018   Family history of gastric cancer 08/10/2018   Epigastric  abdominal pain 08/10/2018   Pain in right knee 07/23/2017   Noise effect on both inner ears 07/22/2017   Presbycusis of both ears 07/22/2017   Stage 3 chronic kidney disease ([place]) 06/29/2017   Diverticulitis 11/04/2016   Benign essential hypertension 11/17/2015   OA (osteoarthritis) of knee 05/22/2014   Colon polyps 06/21/2012   Diverticulosis 06/21/2012   Fluid retention 06/21/2012   History of pancreatitis 05/17/2012   H. pylori infection    Varicose veins of lower extremities with other complications 93/81/0175   GASTRITIS 02/07/2010   Hyperlipidemia 01/16/2010   Anxiety state  01/16/2010   Allergic rhinitis 01/16/2010   DIVERTICULAR DISEASE 01/16/2010   Constipation 01/16/2010   NAUSEA AND VOMITING 01/16/2010   PERSONAL HX COLONIC POLYPS 01/16/2010    Stacy Gardner, PT, DPT 04/15/2309:48 AM   [place] @ [place], [place], [address] Phone: [phone]   Fax:  [phone]  Name: Kayla Stokes MRN: [id number] Date of Birth: 05-22-1945

## 2021-04-18 DIAGNOSIS — M19019 Primary osteoarthritis, unspecified shoulder: Secondary | ICD-10-CM | POA: Insufficient documentation

## 2021-04-22 ENCOUNTER — Ambulatory Visit: Payer: Medicare PPO | Admitting: Physical Therapy

## 2021-04-22 ENCOUNTER — Encounter: Payer: Self-pay | Admitting: Physical Therapy

## 2021-04-22 ENCOUNTER — Other Ambulatory Visit: Payer: Self-pay

## 2021-04-22 DIAGNOSIS — R278 Other lack of coordination: Secondary | ICD-10-CM

## 2021-04-22 DIAGNOSIS — M6281 Muscle weakness (generalized): Secondary | ICD-10-CM | POA: Diagnosis not present

## 2021-04-22 NOTE — Therapy (Signed)
[place] @ [place], [place], [address] Phone: [phone]   Fax:  [phone]  Physical Therapy Treatment  Patient Details  Name: Kayla Stokes MRN: [id number] Date of Birth: 1945/12/23 Referring Provider (PT): Tamela Gammon, NP   Encounter Date: 04/22/2021   PT End of Session - 04/22/21 1102     Visit Number 9    Date for PT Re-Evaluation 05/14/21    Authorization Type [employer]    PT Start Time 1100    PT Stop Time 1140    PT Time Calculation (min) 40 min    Activity Tolerance Patient tolerated treatment well    Behavior During Therapy [facility] for tasks assessed/performed             Past Medical History:  Diagnosis Date   Allergic rhinitis    Arthritis    finger   Bursitis of right hip    Complication of anesthesia    difficult to awaken after gallbladder surgery   Dental crowns present    Eczema    elbow   GERD (gastroesophageal reflux disease)    H. pylori infection    History of hiatal hernia    Hypertension    under control, has been on med. x 10 yrs.   Mass of hand 05/2011   right   Measles    hx of    Mumps    hx of    Osteopenia 11/2012   T score -1.1 left forearm FRAX 6.2%/0.1%   Pancreatitis    Rubella    hx of    Trochanteric bursitis of left hip     Past Surgical History:  Procedure Laterality Date   ABDOMINAL HYSTERECTOMY     ABLATION SAPHENOUS VEIN W/ RFA  10-2011   CATARACT EXTRACTION W/ INTRAOCULAR LENS  IMPLANT, BILATERAL  09/2010, 01/2011   bilateral   CHOLECYSTECTOMY  MAY 2014   CHONDROPLASTY  02/12/2011   Procedure: CHONDROPLASTY;  Surgeon: Gearlean Alf;  Location: [place];  Service: Orthopedics;  Laterality: Left;   EYE SURGERY     laser right eye; due to right eye hemorrhage; had left eye retinal tear with subsequent surgery   KNEE ARTHROSCOPY  02/12/2011   Procedure: ARTHROSCOPY KNEE;  Surgeon: Gearlean Alf;  Location:  [place];  Service: Orthopedics;  Laterality: Left;   KNEE SURGERY  2021   MASS EXCISION  06/05/2011   Procedure: EXCISION MASS;  Surgeon: Linna Hoff, MD;  Location: [place];  Service: Orthopedics;  Laterality: Right;  right thumb   MENISCUS DEBRIDEMENT  02/12/2011   Procedure: DEBRIDEMENT OF MENISCUS;  Surgeon: Gearlean Alf;  Location: [place];  Service: Orthopedics;  Laterality: Left;   MOUTH SURGERY     implants    stent placement after cholecestectomy     TOTAL KNEE ARTHROPLASTY Left 05/22/2014   Procedure: LEFT TOTAL KNEE ARTHROPLASTY;  Surgeon: Gaynelle Arabian, MD;  Location: [facility];  Service: Orthopedics;  Laterality: Left;   TUBAL LIGATION     VAGINAL HYSTERECTOMY WITH A&P REPAIR  1989   VARICOSE VEIN SURGERY  02/13/2003   ligation and stripping left greater saphenous; exc.  multiple varicosities    There were no vitals filed for this visit.   Subjective Assessment - 04/22/21 1102     Subjective Pt reports no leakage at all since last session has been working in the yard more without  leakage, no prolapse symptoms, and not usually getting up to urinate at night. Pt reports she had Rt shoulder MRI and found to have 2 rotator cuff tears and a bone spur and plans to have surgery to address this.    Pertinent History TVH with A & P repair 1989,    How long can you sit comfortably? no limits    How long can you stand comfortably? no limits    How long can you walk comfortably? no limits    Currently in Pain? No/denies                               [facility] Adult PT Treatment/Exercise - 04/22/21 0001       Exercises   Exercises Lumbar;Knee/Hip      Lumbar Exercises: Aerobic   Elliptical 5 mins L3 with cues for core and pelvic floor activation and breathing mechanics      Lumbar Exercises: Standing   Functional Squats 20 reps    Functional Squats Limitations 5# kettle bell    Other Standing Lumbar  Exercises blue band x15 palloffs; rotation palloff blue band x15; x15 shoulder horizontal abduction blue band    Other Standing Lumbar Exercises step on/over/off bosu ball x15 with pelvic floor contraction with step up      Knee/Hip Exercises: Standing   Forward Lunges Right;Left;10 reps    Forward Lunges Limitations coordinating pelvic floor and breathing mechanics                     PT Education - 04/22/21 1142     Education Details pt educated on continuing implenting of pelvic floor with functional tasks at home    Person(s) Educated Patient    Methods Explanation;Demonstration;Tactile cues;Verbal cues    Comprehension Returned demonstration;Verbalized understanding              PT Short Term Goals - 03/04/21 1047       PT SHORT TERM GOAL #1   Title Pt to be I with HEP    Time 5    Period Weeks    Status Achieved    Target Date 02/18/21      PT SHORT TERM GOAL #2   Title pt to demonstrate at least 3/5 pelvic floor strength for decreased leakage symptoms.    Time 5    Period Weeks    Status Achieved    Target Date 02/18/21      PT SHORT TERM GOAL #3   Title pt to report no more than one urinary leak per day to improve QOL and skin integrity at vulva.    Time 5    Period Weeks    Status Achieved    Target Date 02/18/21      PT SHORT TERM GOAL #4   Title pt to recall and demonsrate good recall of voiding mechanics to decrease straining at pelvic floor and prolapse.    Time 5    Period Weeks    Status Achieved    Target Date 02/18/21               PT Long Term Goals - 03/04/21 1047       PT LONG TERM GOAL #1   Title Pt to be I with advanced HEP    Time 4    Period Months    Status On-going    Target Date 05/14/21  PT LONG TERM GOAL #2   Title pt to demonstrate at least 4/5 pelvic floor strength for decreased leakage symptoms    Time 4    Period Months    Status On-going    Target Date 05/14/21      PT LONG TERM GOAL #3    Title pt to report no more than one urinary leak per week to improve QOL and skin integrity at vulva.    Time 4    Period Months    Status On-going    Target Date 05/14/21      PT LONG TERM GOAL #4   Title pt to demonstrate improved bil hip stength to 5/5 throughout to improve pelvic stability    Time 4    Period Months    Status On-going    Target Date 05/14/21                   Plan - 04/22/21 1140     Clinical Impression Statement Pt presents to clinic reporting no leakage at all since last session and no feelings of prolapse this past week either. Pt very pleased with progress. Pt session limited with use of Rt UE as she has 2 rotator cuff tears with plans for repair. Pt tolerated session well with coordinating breathing and core activation with pelvic floor with strengthening exercises for improved mobility with better recruitment of pelvic floor for functional tasks. Pt continues to progress toward goals and has met STG this session and continues demo need to met LTG.    Personal Factors and Comorbidities Comorbidity 1;Fitness    Comorbidities x3 vaginal births, one tearing (with first) into EAS per pt    Examination-Activity Limitations Continence;Hygiene/Grooming;Squat;Caring for Others;Transfers;Lift    Examination-Participation Restrictions Community Activity;Driving;Interpersonal Relationship;Shop    PT Treatment/Interventions ADLs/Self Care Home Management;Aquatic Therapy;Functional mobility training;Therapeutic activities;Therapeutic exercise;Neuromuscular re-education;Manual techniques;Patient/family education;Scar mobilization;Passive range of motion;Energy conservation;Taping    PT Next Visit Plan discharge    PT Home Exercise Plan [id number]    Consulted and Agree with Plan of Care Patient             Patient will benefit from skilled therapeutic intervention in order to improve the following deficits and impairments:  Decreased endurance, Decreased  coordination, Improper body mechanics, Impaired flexibility, Decreased scar mobility, Decreased mobility, Decreased strength, Postural dysfunction  Visit Diagnosis: Muscle weakness (generalized)  Other lack of coordination     Problem List Patient Active Problem List   Diagnosis Date Noted   Impingement syndrome of right shoulder region 03/08/2020   Lumbar radiculopathy 11/04/2019   Trochanteric bursitis of left hip 09/12/2019   History of prosthetic unicompartmental arthroplasty of right knee 04/05/2019   Low back pain 11/29/2018   Family history of gastric cancer 08/10/2018   Epigastric abdominal pain 08/10/2018   Pain in right knee 07/23/2017   Noise effect on both inner ears 07/22/2017   Presbycusis of both ears 07/22/2017   Stage 3 chronic kidney disease ([place]) 06/29/2017   Diverticulitis 11/04/2016   Benign essential hypertension 11/17/2015   OA (osteoarthritis) of knee 05/22/2014   Colon polyps 06/21/2012   Diverticulosis 06/21/2012   Fluid retention 06/21/2012   History of pancreatitis 05/17/2012   H. pylori infection    Varicose veins of lower extremities with other complications 53/97/6734   GASTRITIS 02/07/2010   Hyperlipidemia 01/16/2010   Anxiety state 01/16/2010   Allergic rhinitis 01/16/2010   DIVERTICULAR DISEASE 01/16/2010   Constipation 01/16/2010   NAUSEA AND VOMITING 01/16/2010  PERSONAL HX COLONIC POLYPS 01/16/2010    Stacy Gardner, PT, DPT 04/22/2309:43 AM   [place] @ [place], [place], [address] Phone: [phone]   Fax:  [phone]  Name: Kayla Stokes MRN: [id number] Date of Birth: 04/18/1945

## 2021-04-29 ENCOUNTER — Other Ambulatory Visit: Payer: Self-pay

## 2021-04-29 ENCOUNTER — Ambulatory Visit: Payer: Medicare PPO | Attending: Nurse Practitioner | Admitting: Physical Therapy

## 2021-04-29 ENCOUNTER — Encounter: Payer: Self-pay | Admitting: Physical Therapy

## 2021-04-29 DIAGNOSIS — R279 Unspecified lack of coordination: Secondary | ICD-10-CM | POA: Diagnosis present

## 2021-04-29 DIAGNOSIS — M6281 Muscle weakness (generalized): Secondary | ICD-10-CM | POA: Diagnosis present

## 2021-04-29 NOTE — Therapy (Signed)
[facility] @ [address] [place], [place], [address] Phone: [phone]   Fax:  [phone]  Physical Therapy Treatment  Patient Details  Name: Kayla Stokes MRN: [id number] Date of Birth: [birth year] Referring Provider (PT): Tamela Gammon, NP   Encounter Date: 04/29/2021   PT End of Session - 04/29/21 1107     Visit Number 10    Date for PT Re-Evaluation 05/14/21    Authorization Type [employer]here    PT Start Time 1101    PT Stop Time 1140    PT Time Calculation (min) 39 min    Activity Tolerance Patient tolerated treatment well    Behavior During Therapy [facility] for tasks assessed/performed             Past Medical History:  Diagnosis Date   Allergic rhinitis    Arthritis    finger   Bursitis of right hip    Complication of anesthesia    difficult to awaken after gallbladder surgery   Dental crowns present    Eczema    elbow   GERD (gastroesophageal reflux disease)    H. pylori infection    History of hiatal hernia    Hypertension    under control, has been on med. x 10 yrs.   Mass of hand 05/2011   right   Measles    hx of    Mumps    hx of    Osteopenia 11/2012   T score -1.1 left forearm FRAX 6.2%/0.1%   Pancreatitis    Rubella    hx of    Trochanteric bursitis of left hip     Past Surgical History:  Procedure Laterality Date   ABDOMINAL HYSTERECTOMY     ABLATION SAPHENOUS VEIN W/ RFA  10-2011   CATARACT EXTRACTION W/ INTRAOCULAR LENS  IMPLANT, BILATERAL  09/2010, 01/2011   bilateral   CHOLECYSTECTOMY  MAY 2014   CHONDROPLASTY  02/12/2011   Procedure: CHONDROPLASTY;  Surgeon: Gearlean Alf;  Location: [place];  Service: Orthopedics;  Laterality: Left;   EYE SURGERY     laser right eye; due to right eye hemorrhage; had left eye retinal tear with subsequent surgery   KNEE ARTHROSCOPY  02/12/2011   Procedure: ARTHROSCOPY KNEE;  Surgeon: Gearlean Alf;  Location:  [place];  Service: Orthopedics;  Laterality: Left;   KNEE SURGERY  2021   MASS EXCISION  06/05/2011   Procedure: EXCISION MASS;  Surgeon: Linna Hoff, MD;  Location: [place];  Service: Orthopedics;  Laterality: Right;  right thumb   MENISCUS DEBRIDEMENT  02/12/2011   Procedure: DEBRIDEMENT OF MENISCUS;  Surgeon: Gearlean Alf;  Location: [place];  Service: Orthopedics;  Laterality: Left;   MOUTH SURGERY     implants    stent placement after cholecestectomy     TOTAL KNEE ARTHROPLASTY Left 05/22/2014   Procedure: LEFT TOTAL KNEE ARTHROPLASTY;  Surgeon: Gaynelle Arabian, MD;  Location: [facility];  Service: Orthopedics;  Laterality: Left;   TUBAL LIGATION     VAGINAL HYSTERECTOMY WITH A&P REPAIR  1989   VARICOSE VEIN SURGERY  02/13/2003   ligation and stripping left greater saphenous; exc.  multiple varicosities    There were no vitals filed for this visit.   Subjective Assessment - 04/29/21 1105     Subjective Pt reports she only had very small amount of leakage this morning with needing to use restroom  already and lifted heavy pots outside, no prolapse symptoms.    Pertinent History TVH with A & P repair 1989,    How long can you sit comfortably? no limits    How long can you stand comfortably? no limits    How long can you walk comfortably? no limits    Currently in Pain? No/denies                               [facility] Adult PT Treatment/Exercise - 04/29/21 0001       Exercises   Exercises Lumbar;Knee/Hip      Lumbar Exercises: Stretches   Other Lumbar Stretch Exercise childs pose 30s,      Lumbar Exercises: Standing   Other Standing Lumbar Exercises blue band x20 palloffs; rotation palloff blue band x20; red loop resisted lateral stepping x50x4    Other Standing Lumbar Exercises lateral lunge with one foot on bosu ball x10 each      Lumbar Exercises: Seated   Sit to Stand 20 reps    Sit to Stand  Limitations 10#    Other Seated Lumbar Exercises opp arm/knee press with exhale x20                     PT Education - 04/29/21 1109     Education Details pt educated on continuing to implementing pelvic floor, core, and breathing with tasks at home.    Person(s) Educated Patient    Methods Explanation;Demonstration;Tactile cues;Verbal cues    Comprehension Verbalized understanding;Returned demonstration              PT Short Term Goals - 04/29/21 1154       PT SHORT TERM GOAL #1   Title Pt to be I with HEP    Time 5    Period Weeks    Status Achieved    Target Date 02/18/21      PT SHORT TERM GOAL #2   Title pt to demonstrate at least 3/5 pelvic floor strength for decreased leakage symptoms.    Time 5    Period Weeks    Status Achieved    Target Date 02/18/21      PT SHORT TERM GOAL #3   Title pt to report no more than one urinary leak per day to improve QOL and skin integrity at vulva.    Time 5    Period Weeks    Status Achieved    Target Date 02/18/21      PT SHORT TERM GOAL #4   Title pt to recall and demonsrate good recall of voiding mechanics to decrease straining at pelvic floor and prolapse.    Time 5    Period Weeks    Status Achieved    Target Date 02/18/21               PT Long Term Goals - 04/29/21 1154       PT LONG TERM GOAL #1   Title Pt to be I with advanced HEP    Time 4    Period Months    Status Achieved    Target Date 05/14/21      PT LONG TERM GOAL #2   Title pt to demonstrate at least 4/5 pelvic floor strength for decreased leakage symptoms    Time 4    Period Months    Status Achieved    Target Date 05/14/21  PT LONG TERM GOAL #3   Title pt to report no more than one urinary leak per week to improve QOL and skin integrity at vulva.    Time 4    Period Months    Status Achieved    Target Date 05/14/21      PT LONG TERM GOAL #4   Title pt to demonstrate improved bil hip stength to 5/5 throughout to  improve pelvic stability    Time 4    Period Months    Status Achieved    Target Date 05/14/21                    Patient will benefit from skilled therapeutic intervention in order to improve the following deficits and impairments:     Visit Diagnosis: Muscle weakness (generalized)  Unspecified lack of coordination     Problem List Patient Active Problem List   Diagnosis Date Noted   Impingement syndrome of right shoulder region 03/08/2020   Lumbar radiculopathy 11/04/2019   Trochanteric bursitis of left hip 09/12/2019   History of prosthetic unicompartmental arthroplasty of right knee 04/05/2019   Low back pain 11/29/2018   Family history of gastric cancer 08/10/2018   Epigastric abdominal pain 08/10/2018   Pain in right knee 07/23/2017   Noise effect on both inner ears 07/22/2017   Presbycusis of both ears 07/22/2017   Stage 3 chronic kidney disease ([place]) 06/29/2017   Diverticulitis 11/04/2016   Benign essential hypertension 11/17/2015   OA (osteoarthritis) of knee 05/22/2014   Colon polyps 06/21/2012   Diverticulosis 06/21/2012   Fluid retention 06/21/2012   History of pancreatitis 05/17/2012   H. pylori infection    Varicose veins of lower extremities with other complications 28/36/6294   GASTRITIS 02/07/2010   Hyperlipidemia 01/16/2010   Anxiety state 01/16/2010   Allergic rhinitis 01/16/2010   DIVERTICULAR DISEASE 01/16/2010   Constipation 01/16/2010   NAUSEA AND VOMITING 01/16/2010   PERSONAL HX COLONIC POLYPS 01/16/2010   PHYSICAL THERAPY DISCHARGE SUMMARY  Visits from Start of Care: 10  Current functional level related to goals / functional outcomes: See above for details.    Remaining deficits: All goals met   Education / Equipment: HEP   Patient agrees to discharge. Patient goals were met. Patient is being discharged due to meeting the stated rehab goals. Thank you for the referral.    Stacy Gardner, PT, DPT 04/29/2309:54 AM    [facility] @ [address] [place], [place], [address] Phone: [phone]   Fax:  [phone]  Name: Kayla Stokes MRN: [id number] Date of Birth: Jun 03, 1945

## 2021-07-24 DIAGNOSIS — M25511 Pain in right shoulder: Secondary | ICD-10-CM | POA: Insufficient documentation

## 2021-07-24 DIAGNOSIS — M25611 Stiffness of right shoulder, not elsewhere classified: Secondary | ICD-10-CM | POA: Insufficient documentation

## 2021-09-25 DIAGNOSIS — E538 Deficiency of other specified B group vitamins: Secondary | ICD-10-CM | POA: Insufficient documentation

## 2021-11-05 ENCOUNTER — Other Ambulatory Visit: Payer: Self-pay | Admitting: *Deleted

## 2021-11-05 DIAGNOSIS — M7989 Other specified soft tissue disorders: Secondary | ICD-10-CM

## 2021-11-18 ENCOUNTER — Ambulatory Visit (HOSPITAL_COMMUNITY)
Admission: RE | Admit: 2021-11-18 | Discharge: 2021-11-18 | Disposition: A | Discharge status: Home / Self Care | Payer: Medicare PPO | Source: Ambulatory Visit | Attending: Surgery | Admitting: Surgery

## 2021-11-18 ENCOUNTER — Encounter: Payer: Self-pay | Admitting: Surgery

## 2021-11-18 ENCOUNTER — Ambulatory Visit (INDEPENDENT_AMBULATORY_CARE_PROVIDER_SITE_OTHER): Payer: Medicare PPO | Admitting: Surgery

## 2021-11-18 VITALS — BP 124/82 | HR 82 | Temp 98.1°F | Resp 20 | Ht 63.0 in | Wt 165.0 lb

## 2021-11-18 DIAGNOSIS — M7989 Other specified soft tissue disorders: Secondary | ICD-10-CM

## 2021-11-18 NOTE — Progress Notes (Signed)
[facility]  Patient name: Kayla Stokes MRN: [id number] DOB: Oct 13, 1945 Sex: female   REQUESTING PROVIDER:    Terrill Mohr   REASON FOR CONSULT:    Leg swelling  HISTORY OF PRESENT ILLNESS:   Kayla Stokes is a 76 y.o. female, who is is here today for further evaluation of her leg swelling.  The patient has a history of left great saphenous vein ligation and stripping from her ankle to her knee by Dr. Kellie Simmering in 2004.  She subsequently underwent left greater saphenous vein laser ablation by Dr. Kellie Simmering in 2013.  He saw Dr. Donnetta Hutching in 2020.  Her saphenous vein remains closed.  Management with compression stockings was recommended.  Since that time, she has had persistent leg swelling and is now having some pain and discoloration around her left ankle.  She is also concerned about an area on top of her right knee.   Patient is medically managed for hypertension.  She takes a statin for hypercholesterolemia.  PAST MEDICAL HISTORY    Past Medical History:  Diagnosis Date   Allergic rhinitis    Arthritis    finger   Bursitis of right hip    Complication of anesthesia    difficult to awaken after gallbladder surgery   Dental crowns present    Eczema    elbow   GERD (gastroesophageal reflux disease)    H. pylori infection    History of hiatal hernia    Hypertension    under control, has been on med. x 10 yrs.   Mass of hand 05/2011   right   Measles    hx of    Mumps    hx of    Osteopenia 11/2012   T score -1.1 left forearm FRAX 6.2%/0.1%   Pancreatitis    Rubella    hx of    Trochanteric bursitis of left hip      FAMILY HISTORY   Family History  Problem Relation Age of Onset   Diabetes Mother    Hypertension Mother    Heart disease Mother    Colon cancer Mother 45   Hypertension Father    Cervical cancer Sister    Heart disease Brother    Lung cancer Brother    Throat cancer Brother     Kidney Stones Daughter    Colon polyps Neg Hx     SOCIAL HISTORY:   Social History   Socioeconomic History   Marital status: Married    Spouse name: Not on file   Number of children: Not on file   Years of education: Not on file   Highest education level: Not on file  Occupational History   Not on file  Tobacco Use   Smoking status: Never   Smokeless tobacco: Never  Vaping Use   Vaping Use: Never used  Substance and Sexual Activity   Alcohol use: Yes    Comment: Occas   Drug use: No   Sexual activity: Not Currently    Partners: Male    Birth control/protection: Post-menopausal    Comment: intercourse age 69, more than 5 sexual partners, des neg  Other Topics Concern   Not on file  Social History Narrative   Not on file   Social Determinants of Health   Financial Resource Strain: Not on file  Food Insecurity: Not on file  Transportation Needs: Not on file  Physical Activity: Not on file  Stress: Not on file  Social  Connections: Not on file  Intimate Partner Violence: Not on file    ALLERGIES:    Allergies  Allergen Reactions   Mobic [Meloxicam]    Nitrofurantoin Monohyd Macro Other (See Comments)    SEVERE STOMACH PAIN    CURRENT MEDICATIONS:    Current Outpatient Medications  Medication Sig Dispense Refill   ALPRAZolam (XANAX) 0.25 MG tablet Take 0.25 mg by mouth 3 (three) times daily as needed for anxiety. For anxiety.     fluticasone (FLONASE) 50 MCG/ACT nasal spray Place 1 spray into both nostrils daily as needed for allergies or rhinitis.     MAGNESIUM PO Take 1 tablet by mouth daily.     Nutritional Supplements (JUICE PLUS FIBRE PO) Take by mouth.     omeprazole (PRILOSEC) 40 MG capsule Take 40 mg by mouth daily as needed (acid reflux).   3   Polyethyl Glycol-Propyl Glycol (SYSTANE OP) Apply 1 drop to eye 3 (three) times daily as needed (dry eyes).     potassium gluconate 595 (99 K) MG TABS tablet Take 595 mg by mouth. 2 tabs qhs     Probiotic  Product (PROBIOTIC PO) Take by mouth.     rosuvastatin (CRESTOR) 5 MG tablet Take 5 mg by mouth at bedtime.     triamcinolone cream (KENALOG) 0.1 % Apply 1 application topically daily as needed (itching.).     triamterene-hydrochlorothiazide (MAXZIDE-25) 37.5-25 MG per tablet Take 1 tablet by mouth daily.  11   VITAMIN D PO Take by mouth.     No current facility-administered medications for this visit.    REVIEW OF SYSTEMS:   '[X]'$  denotes positive finding, '[ ]'$  denotes negative finding Cardiac  Comments:  Chest pain or chest pressure:    Shortness of breath upon exertion:    Short of breath when lying flat:    Irregular heart rhythm:        Vascular    Pain in calf, thigh, or hip brought on by ambulation:    Pain in feet at night that wakes you up from your sleep:     Blood clot in your veins:    Leg swelling:  x       Pulmonary    Oxygen at home:    Productive cough:     Wheezing:         Neurologic    Sudden weakness in arms or legs:     Sudden numbness in arms or legs:     Sudden onset of difficulty speaking or slurred speech:    Temporary loss of vision in one eye:     Problems with dizziness:         Gastrointestinal    Blood in stool:      Vomited blood:         Genitourinary    Burning when urinating:     Blood in urine:        Psychiatric    Major depression:         Hematologic    Bleeding problems:    Problems with blood clotting too easily:        Skin    Rashes or ulcers:        Constitutional    Fever or chills:     PHYSICAL EXAM:   Vitals:   11/18/21 1132  BP: 124/82  Pulse: 82  Resp: 20  Temp: 98.1 F (36.7 C)  SpO2: 97%  Weight: 165 lb (74.8 kg)  Height: 5'  3" (1.6 m)    GENERAL: The patient is a well-nourished female, in no acute distress. The vital signs are documented above. CARDIAC: There is a regular rate and rhythm.  VASCULAR: Triphasic pedal Doppler signals PULMONARY: Nonlabored respirations ABDOMEN: Soft and non-tender  with normal pitched bowel sounds.  MUSCULOSKELETAL: There are no major deformities or cyanosis. NEUROLOGIC: No focal weakness or paresthesias are detected. SKIN: See photo below PSYCHIATRIC: The patient has a normal affect.     STUDIES:   I have reviewed the following reflux study: Left:  - No evidence of deep vein thrombosis from the common femoral through the  popliteal veins.  - No evidence of superficial venous thrombosis.  - The deep venous system is competent.  - The great saphenous vein was not identified and consistent with history  of ablation/stripping.  - The small saphenous vein is competent.   ASSESSMENT and PLAN   Chronic venous insufficiency: I reviewed the ultrasound with the patient which shows that her saphenous vein remains closed.  She does not have deep vein reflux.  She has no correctable reflux.  Therefore, I suspect the residual edema she has is related to lymphedema.  The best management for this is going to be leg elevation and compression.  Because of her shoulder issues, she has trouble getting compression socks on and so I have recommended trying socks with zippers.  She is concerned about a varicosity over top of her right knee as well as some areas around her left ankle.  I feel these would be amenable to sclerotherapy.  I will have Izora Gala reach out to her tomorrow to arrange this.   Leia Alf, MD, FACS [facility] [phone] Pager [phone]

## 2021-11-20 ENCOUNTER — Telehealth: Payer: Self-pay

## 2021-11-20 NOTE — Telephone Encounter (Signed)
Called pt to discuss sclerotherapy. Pt is wanting to call back sometime next month to schedule in future. All questions were answered.

## 2022-08-14 ENCOUNTER — Ambulatory Visit (INDEPENDENT_AMBULATORY_CARE_PROVIDER_SITE_OTHER): Payer: Medicare PPO

## 2022-08-14 ENCOUNTER — Encounter: Payer: Self-pay | Admitting: Podiatry

## 2022-08-14 ENCOUNTER — Ambulatory Visit (INDEPENDENT_AMBULATORY_CARE_PROVIDER_SITE_OTHER): Payer: Medicare PPO | Admitting: Podiatry

## 2022-08-14 DIAGNOSIS — M778 Other enthesopathies, not elsewhere classified: Secondary | ICD-10-CM

## 2022-08-14 DIAGNOSIS — M7751 Other enthesopathy of right foot: Secondary | ICD-10-CM

## 2022-08-14 MED ORDER — TRIAMCINOLONE ACETONIDE 40 MG/ML IJ SUSP
20.0000 mg | Freq: Once | INTRAMUSCULAR | Status: AC
Start: 2022-08-14 — End: 2022-08-14
  Administered 2022-08-14: 20 mg

## 2022-08-14 NOTE — Progress Notes (Signed)
She presents today after having not seen her for couple years with chief complaint of pain to the dorsal aspect of the second interdigital space of the right foot.  She states that she has sharp stabbing pains intermittently over the past 2 weeks and denies any trauma.  She states that sometimes she develops hot and cold feet as well.  Objective: Vital signs are stable alert oriented x 3.  Pulses are palpable.  No erythema Dem salines drainage odor has pain on palpation to the second interdigital space no palpable Mulder's click.  She does have pain however on the end range of motion at the metatarsal phalangeal joint.  Assessment: Capsulitis tendinitis of the toe.  She also may be starting early neuropathy.  Plan: Discussed etiology pathology and surgical therapies I injected 10 mg of Kenalog and local anesthetic around the second metatarsal phalangeal joint of the right foot.  Also discussed over-the-counter medications such as alpha lipoic acid and Thera works for nerve pain.

## 2022-08-15 DIAGNOSIS — R109 Unspecified abdominal pain: Secondary | ICD-10-CM | POA: Insufficient documentation

## 2022-12-26 ENCOUNTER — Encounter: Payer: Self-pay | Admitting: Obstetrics and Gynecology

## 2022-12-26 ENCOUNTER — Ambulatory Visit (INDEPENDENT_AMBULATORY_CARE_PROVIDER_SITE_OTHER): Payer: Medicare PPO | Admitting: Obstetrics and Gynecology

## 2022-12-26 VITALS — BP 128/74 | HR 80 | Ht 63.0 in | Wt 175.0 lb

## 2022-12-26 DIAGNOSIS — N958 Other specified menopausal and perimenopausal disorders: Secondary | ICD-10-CM | POA: Diagnosis not present

## 2022-12-26 DIAGNOSIS — N390 Urinary tract infection, site not specified: Secondary | ICD-10-CM | POA: Insufficient documentation

## 2022-12-26 DIAGNOSIS — R339 Retention of urine, unspecified: Secondary | ICD-10-CM | POA: Diagnosis not present

## 2022-12-26 DIAGNOSIS — R350 Frequency of micturition: Secondary | ICD-10-CM | POA: Diagnosis not present

## 2022-12-26 DIAGNOSIS — R300 Dysuria: Secondary | ICD-10-CM | POA: Diagnosis not present

## 2022-12-26 MED ORDER — AMOXICILLIN-POT CLAVULANATE 500-125 MG PO TABS: 1.0000 | ORAL_TABLET | Freq: Two times a day (BID) | ORAL | 0 refills | Status: AC

## 2022-12-26 MED ORDER — ESTRADIOL 0.1 MG/GM VA CREA: TOPICAL_CREAM | VAGINAL | 1 refills | Status: DC

## 2022-12-26 MED ORDER — SULFAMETHOXAZOLE-TRIMETHOPRIM 800-160 MG PO TABS: 1.0000 | ORAL_TABLET | Freq: Two times a day (BID) | ORAL | 0 refills | Status: DC

## 2022-12-26 NOTE — Progress Notes (Signed)
77 y.o. G73P3003 female here for urinary complaints  No LMP recorded. Patient has had a hysterectomy.   Complains of fatigue, burning, frequency, dysuria, urgency, and urinary retention, urinary odor, pelvic pressures. No discharge or itching. Reports 3 UTI this year, last at the beginning of October. Dx by her PCP. Attempted to void but only left a small sample.  OB History  Gravida Para Term Preterm AB Living  3 3 3     3   SAB IAB Ectopic Multiple Live Births               # Outcome Date GA Lbr Len/2nd Weight Sex Type Anes PTL Lv  3 Term           2 Term           1 Term             Past Medical History:  Diagnosis Date   Allergic rhinitis    Arthritis    finger   Bursitis of right hip    Complication of anesthesia    difficult to awaken after gallbladder surgery   Dental crowns present    Eczema    elbow   GERD (gastroesophageal reflux disease)    H. pylori infection    History of hiatal hernia    Hypertension    under control, has been on med. x 10 yrs.   Mass of hand 05/2011   right   Measles    hx of    Mumps    hx of    Osteopenia 11/2012   T score -1.1 left forearm FRAX 6.2%/0.1%   Pancreatitis    Rubella    hx of    Trochanteric bursitis of left hip     Past Surgical History:  Procedure Laterality Date   ABDOMINAL HYSTERECTOMY     ABLATION SAPHENOUS VEIN W/ RFA  10-2011   CATARACT EXTRACTION W/ INTRAOCULAR LENS  IMPLANT, BILATERAL  09/2010, 01/2011   bilateral   CHOLECYSTECTOMY  MAY 2014   CHONDROPLASTY  02/12/2011   Procedure: CHONDROPLASTY;  Surgeon: Loanne Drilling;  Location: [facility];  Service: Orthopedics;  Laterality: Left;   EYE SURGERY     laser right eye; due to right eye hemorrhage; had left eye retinal tear with subsequent surgery   KNEE ARTHROSCOPY  02/12/2011   Procedure: ARTHROSCOPY KNEE;  Surgeon: Loanne Drilling;  Location: [facility];  Service: Orthopedics;  Laterality: Left;   KNEE SURGERY   2021   MASS EXCISION  06/05/2011   Procedure: EXCISION MASS;  Surgeon: Sharma Covert, MD;  Location: [facility];  Service: Orthopedics;  Laterality: Right;  right thumb   MENISCUS DEBRIDEMENT  02/12/2011   Procedure: DEBRIDEMENT OF MENISCUS;  Surgeon: Loanne Drilling;  Location: [facility];  Service: Orthopedics;  Laterality: Left;   MOUTH SURGERY     implants    stent placement after cholecestectomy     TOTAL KNEE ARTHROPLASTY Left 05/22/2014   Procedure: LEFT TOTAL KNEE ARTHROPLASTY;  Surgeon: Ollen Gross, MD;  Location: [facility];  Service: Orthopedics;  Laterality: Left;   TUBAL LIGATION     VAGINAL HYSTERECTOMY WITH A&P REPAIR  1989   VARICOSE VEIN SURGERY  02/13/2003   ligation and stripping left greater saphenous; exc.  multiple varicosities    Current Outpatient Medications on File Prior to Visit  Medication Sig Dispense Refill   ALPRAZolam (XANAX) 0.25 MG tablet Take  0.25 mg by mouth 3 (three) times daily as needed for anxiety. For anxiety.     Cranberry 450 MG TABS Take by mouth.     fluticasone (FLONASE) 50 MCG/ACT nasal spray Place 1 spray into both nostrils daily as needed for allergies or rhinitis.     MAGNESIUM PO Take 1 tablet by mouth daily.     Nutritional Supplements (JUICE PLUS FIBRE PO) Take by mouth.     omeprazole (PRILOSEC) 40 MG capsule Take 40 mg by mouth daily as needed (acid reflux).   3   Polyethyl Glycol-Propyl Glycol (SYSTANE OP) Apply 1 drop to eye 3 (three) times daily as needed (dry eyes).     potassium gluconate 595 (99 K) MG TABS tablet Take 595 mg by mouth. 2 tabs qhs     Probiotic Product (PROBIOTIC PO) Take by mouth.     rosuvastatin (CRESTOR) 5 MG tablet Take 5 mg by mouth at bedtime.     triamcinolone cream (KENALOG) 0.1 % Apply 1 application topically daily as needed (itching.).     triamterene-hydrochlorothiazide (MAXZIDE-25) 37.5-25 MG per tablet Take 1 tablet by mouth daily.  11   VITAMIN D PO Take by mouth.      No current facility-administered medications on file prior to visit.    Allergies  Allergen Reactions   Mobic [Meloxicam]    Nitrofurantoin Monohyd Macro Other (See Comments)    SEVERE STOMACH PAIN      PE Today's Vitals   12/26/22 0918  BP: 128/74  Pulse: 80  SpO2: 96%  Weight: 175 lb (79.4 kg)  Height: 5\' 3"  (1.6 m)   Body mass index is 31 kg/m.  Physical Exam Vitals reviewed. Exam conducted with a chaperone present.  Constitutional:      General: She is not in acute distress.    Appearance: Normal appearance.  HENT:     Head: Normocephalic and atraumatic.     Nose: Nose normal.  Eyes:     Extraocular Movements: Extraocular movements intact.     Conjunctiva/sclera: Conjunctivae normal.  Pulmonary:     Effort: Pulmonary effort is normal.  Genitourinary:    General: Normal vulva.     Exam position: Lithotomy position.     Vagina: Normal. No vaginal discharge.     Cervix: Normal. No cervical motion tenderness, discharge or lesion.     Uterus: Normal. Not enlarged and not tender.      Adnexa: Right adnexa normal and left adnexa normal.     Comments: Vulvar atrophy noted Musculoskeletal:        General: Normal range of motion.     Cervical back: Normal range of motion.  Neurological:     General: No focal deficit present.     Mental Status: She is alert.  Psychiatric:        Mood and Affect: Mood normal.        Behavior: Behavior normal.     Bladder catheterization was performed after prepping the urethral opening with betadine with a flexible 14 fr catheter. 60cc was emptied from the bladder. Specimen was collected for Ucx.    Assessment and Plan:        Dysuria -     Urinalysis,Complete w/RFL Culture -     Amoxicillin-Pot Clavulanate; Take 1 tablet by mouth in the morning and at bedtime for 5 days.  Dispense: 10 tablet; Refill: 0  Urinary frequency -     Urinalysis,Complete w/RFL Culture  Urinary retention -  Urinalysis,Complete w/RFL  Culture  Genitourinary syndrome of menopause Assessment & Plan: Discussed vaginal estradiol has been show to decrease UTIs for menopausal women. Reviewed safety profile of low dose vaginal estrogen, however reviewed that higher doses have been associated with DVT, breast and uterine cancer.   Patient agreeable to start.  Orders: -     Estradiol; Apply 1/2 gram to vulva nightly for 2 weeks then decrease to 1/2 gram to vulva two nights a week.  Dispense: 42.5 g; Refill: 1  Recurrent UTI Assessment & Plan: Will treat empirically, given moderate bacteria for IC specimen. Reviewed prior Ucx x3, most recent +E.Coli with resistance to ampicillin and bactrim.    Rosalyn Gess, MD

## 2022-12-26 NOTE — Assessment & Plan Note (Signed)
Will treat empirically, given moderate bacteria for IC specimen. Reviewed prior Ucx x3, most recent +E.Coli with resistance to ampicillin and bactrim.

## 2022-12-26 NOTE — Assessment & Plan Note (Signed)
Discussed vaginal estradiol has been show to decrease UTIs for menopausal women. Reviewed safety profile of low dose vaginal estrogen, however reviewed that higher doses have been associated with DVT, breast and uterine cancer.   Patient agreeable to start.

## 2022-12-29 LAB — URINALYSIS, COMPLETE W/RFL CULTURE
Bilirubin Urine: NEGATIVE
Glucose, UA: NEGATIVE
Hyaline Cast: NONE SEEN /LPF
Ketones, ur: NEGATIVE
Nitrites, Initial: NEGATIVE
Protein, ur: NEGATIVE
Specific Gravity, Urine: 1.015 (ref 1.001–1.035)
WBC, UA: >=60 /[HPF] — AB (ref 0–5)
pH: 7 (ref 5.0–8.0)

## 2022-12-29 LAB — URINE CULTURE
MICRO NUMBER:: 15675000
SPECIMEN QUALITY:: ADEQUATE

## 2022-12-29 LAB — CULTURE INDICATED

## 2023-01-07 ENCOUNTER — Telehealth: Payer: Self-pay

## 2023-01-07 NOTE — Telephone Encounter (Signed)
Patient called and left a message - she has an appointment with you on 01/15/23 for the pain in her toe and between the toes. She wants to know what she can do in the meantime for pain.  Please advise thanks

## 2023-01-15 ENCOUNTER — Encounter: Payer: Self-pay | Admitting: Podiatry

## 2023-01-15 ENCOUNTER — Ambulatory Visit (INDEPENDENT_AMBULATORY_CARE_PROVIDER_SITE_OTHER): Payer: Medicare PPO | Admitting: Podiatry

## 2023-01-15 ENCOUNTER — Ambulatory Visit (INDEPENDENT_AMBULATORY_CARE_PROVIDER_SITE_OTHER): Payer: Medicare PPO

## 2023-01-15 DIAGNOSIS — M7751 Other enthesopathy of right foot: Secondary | ICD-10-CM

## 2023-01-15 DIAGNOSIS — M778 Other enthesopathies, not elsewhere classified: Secondary | ICD-10-CM

## 2023-01-15 MED ORDER — DEXAMETHASONE SODIUM PHOSPHATE 120 MG/30ML IJ SOLN
2.0000 mg | Freq: Once | INTRAMUSCULAR | Status: AC
  Administered 2023-01-15: 2 mg via INTRA_ARTICULAR

## 2023-01-15 NOTE — Progress Notes (Signed)
She presents today for follow-up of capsulitis of the second digit of the right foot.  She had a shot back in June which really helped her a lot but she is recently redeveloped the pain.  States that she went out purchased a pair of stiff soled [employer] tennis shoes which has really made a difference.  She states that the puffiness and the redness has subsided to some degree but there is still some tenderness.  Objective: Vital signs stable alert and oriented x 3.  Pulses are palpable.  There is no erythema cellulitis drainage or odor though she does have pain on palpation with some mild edema to the second interdigital space of the right foot.  She has some tenderness on range of motion of the second and third metatarsophalangeal joints.  Radiographs taken today demonstrate an osseously mature individual with generalized demineralization of the bones.  No fractures are identified.  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  Assessment: Pain in limb secondary to capsulitis second intermetatarsal space right.  Plan: Discussed etiology pathology conservative surgical therapies injected 4 mg of dexamethasone to the interspace recommended she continue the use of her new shoes and I will follow-up with her in the near future.

## 2023-01-21 ENCOUNTER — Ambulatory Visit (INDEPENDENT_AMBULATORY_CARE_PROVIDER_SITE_OTHER): Payer: Medicare PPO | Admitting: Nurse Practitioner

## 2023-01-21 ENCOUNTER — Encounter: Payer: Self-pay | Admitting: Nurse Practitioner

## 2023-01-21 VITALS — BP 108/60 | HR 77 | Ht 62.25 in | Wt 173.0 lb

## 2023-01-21 DIAGNOSIS — Z780 Asymptomatic menopausal state: Secondary | ICD-10-CM

## 2023-01-21 DIAGNOSIS — B009 Herpesviral infection, unspecified: Secondary | ICD-10-CM

## 2023-01-21 DIAGNOSIS — Z87440 Personal history of urinary (tract) infections: Secondary | ICD-10-CM | POA: Diagnosis not present

## 2023-01-21 DIAGNOSIS — Z9289 Personal history of other medical treatment: Secondary | ICD-10-CM | POA: Diagnosis not present

## 2023-01-21 DIAGNOSIS — Z9189 Other specified personal risk factors, not elsewhere classified: Secondary | ICD-10-CM | POA: Diagnosis not present

## 2023-01-21 DIAGNOSIS — Z01419 Encounter for gynecological examination (general) (routine) without abnormal findings: Secondary | ICD-10-CM

## 2023-01-21 DIAGNOSIS — N958 Other specified menopausal and perimenopausal disorders: Secondary | ICD-10-CM

## 2023-01-21 NOTE — Progress Notes (Signed)
Kayla Stokes 01/30/1946 [id number]  History:  77 y.o. G3P3003 presents for breast and pelvic exam. Postmenopausal - no HRT. S/P 1989 TVH with A & P repair.  Normal pap and mammogram history. Started vaginal estrogen recently for recurrent UTIs, has had 4 this year. Treated for UTI earlier this month. Would like UA today. Had one day of urinary discomfort last week, increased water intake and took AZO and symptoms resolved. Did pelvic floor PT a couple of years ago for urinary incontinence and has had improvement. Plans to get back to the gym soon.   Gynecologic History No LMP recorded. Patient has had a hysterectomy.   Contraception/Family planning: status post hysterectomy Sexually active: No  Health Maintenance Last Pap: 2012. Results were: Normal Last mammogram: 12/2022. Results were: Normal Last colonoscopy: 2020. Results were: polyps, 7-year recall Last Dexa: 01/15/2021. Results were: Normal  Past medical history, past surgical history, family history and social history were all reviewed and documented in the EPIC chart. Married. 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 One daughter living at home temporarily, starts new job in [place] in February. Grandchildren ages 65-14.  Mother with history of colon cancer at age 31.   ROS:  A ROS was performed and pertinent positives and negatives are included.  Exam:  Vitals:   01/21/23 1040  BP: 108/60  Pulse: 77  SpO2: 99%  Weight: 173 lb (78.5 kg)  Height: 5' 2.25" (1.581 m)     Body mass index is 31.39 kg/m.  General appearance:  Normal Thyroid:  Symmetrical, normal in size, without palpable masses or nodularity. Respiratory  Auscultation:  Clear without wheezing or rhonchi Cardiovascular  Auscultation:  Regular rate, without rubs, murmurs or gallops  Edema/varicosities:  Not grossly evident Abdominal  Soft,nontender, without masses, guarding or rebound.  Liver/spleen:  No organomegaly noted  Hernia:  None appreciated   Skin  Inspection:  Grossly normal   Breasts: Examined lying and sitting.   Right: Without masses, retractions, discharge or axillary adenopathy.   Left: Without masses, retractions, discharge or axillary adenopathy. Pelvic: External genitalia:  no lesions              Urethra:  normal appearing urethra with no masses, tenderness or lesions              Bartholins and Skenes: normal                 Vagina: normal appearing vagina with normal color and discharge, no lesions. 1+ cystocele and rectocele              Cervix: absent Bimanual Exam:  Uterus: absent              Adnexa: no mass, fullness, tenderness              Rectovaginal: Deferred              Anus:  normal, no lesions  Patient informed chaperone available to be present for breast and pelvic exam. Patient has requested no chaperone to be present. Patient has been advised what will be completed during breast and pelvic exam.   UA: 1+ leukocytosis, neg nitrite, trace blood, yellow/cloudy. Microscopic. WBC 20-40, rbc 0-2, moderate bacteria  Assessment/Plan:  77 y.o. [id number] for breast and pelvic exam.   Encounter for breast and pelvic examination - Education provided on SBEs, importance of preventative screenings, current guidelines, high calcium diet, regular exercise, and multivitamin daily. Labs with PCP.    Postmenopausal - No HRT.  Genitourinary syndrome of menopause  Continue vaginal estrogen twice weekly. H/O recurrent UTI, has had 4 this year.   History of UTI - Plan: Urinalysis,Complete w/RFL Culture. Some leukocytosis in urinalysis today. Asymptomatic. Will wait on culture.   Screening for cervical cancer - Normal pap history. No longer screening per guidelines.  Screening for breast cancer - Normal mammogram history. Continue annual screenings. Normal breast exam today.   Screening for colon cancer - 2020 colonoscopy. will repeat at GI's recommended interval. Mother with history of colon cancer.  Screening  for osteoporosis - Normal DXA last year.   Return in about 1 year (around 01/21/2024) for B&P, high risk.       Olivia Mackie [facility], 11:48 AM 01/21/2023

## 2023-01-23 LAB — URINALYSIS, COMPLETE W/RFL CULTURE
Bilirubin Urine: NEGATIVE
Glucose, UA: NEGATIVE
Hyaline Cast: NONE SEEN /[LPF]
Ketones, ur: NEGATIVE
Nitrites, Initial: NEGATIVE
Protein, ur: NEGATIVE
Specific Gravity, Urine: 1.015 (ref 1.001–1.035)
pH: 7.5 (ref 5.0–8.0)

## 2023-01-23 LAB — URINE CULTURE
MICRO NUMBER:: 15787538
SPECIMEN QUALITY:: ADEQUATE

## 2023-01-23 LAB — CULTURE INDICATED

## 2023-01-26 ENCOUNTER — Other Ambulatory Visit: Payer: Self-pay | Admitting: Nurse Practitioner

## 2023-01-26 DIAGNOSIS — N3000 Acute cystitis without hematuria: Secondary | ICD-10-CM

## 2023-01-26 DIAGNOSIS — N390 Urinary tract infection, site not specified: Secondary | ICD-10-CM

## 2023-01-26 MED ORDER — CIPROFLOXACIN HCL 250 MG PO TABS: 250.0000 mg | ORAL_TABLET | Freq: Two times a day (BID) | ORAL | 0 refills | Status: AC

## 2023-04-14 ENCOUNTER — Encounter: Payer: Self-pay | Admitting: Podiatry

## 2023-04-14 ENCOUNTER — Ambulatory Visit (INDEPENDENT_AMBULATORY_CARE_PROVIDER_SITE_OTHER): Payer: Medicare PPO

## 2023-04-14 ENCOUNTER — Ambulatory Visit (INDEPENDENT_AMBULATORY_CARE_PROVIDER_SITE_OTHER): Payer: Medicare PPO | Admitting: Podiatry

## 2023-04-14 DIAGNOSIS — M76812 Anterior tibial syndrome, left leg: Secondary | ICD-10-CM | POA: Diagnosis not present

## 2023-04-14 DIAGNOSIS — S9032XA Contusion of left foot, initial encounter: Secondary | ICD-10-CM | POA: Diagnosis not present

## 2023-04-14 MED ORDER — DEXAMETHASONE SODIUM PHOSPHATE 120 MG/30ML IJ SOLN
2.0000 mg | Freq: Once | INTRAMUSCULAR | Status: AC
  Administered 2023-04-14: 2 mg via INTRA_ARTICULAR

## 2023-04-15 NOTE — Progress Notes (Signed)
She presents today stating that she fell back in early February causing the second and third toes of the left foot to be bruised.  She also has sort of a swollen purple area in the region and also a bump to the medial aspect that was there prior to the fall.  She points to the distalmost aspect of the tibialis anterior tendon.  Objective: Vital signs are stable oriented x 3.  Pulses are palpable.  She has somewhat boggy sensation of a soft tissue mass nonpulsatile to the distal and distal plantar aspect of the first metatarsal medial cuneiform joint area just distal to the tibialis anterior tendon insertion.  This is moderately tender.  She also has some bruising to toes #2 and #3 of the left foot but are minimally tender on range of motion.  Radiographs taken today demonstrate an osseously mature individual with generalized bone demineralization.  No fractures are identified no acute findings though she does have soft tissue swelling to the medial aspect of the foot and the general location of the tenderness.  Assessment: Most likely insertional tendinitis of the tibialis anterior tendon with contusion to toes secondary to the fall.  Plan: Discussed etiology pathology conservative surgical therapies at this point I injected dexamethasone and local anesthetic left foot.  I also recommended that she wear stiff soled shoes and something he feels comfortable in general.

## 2023-04-16 DIAGNOSIS — K760 Fatty (change of) liver, not elsewhere classified: Secondary | ICD-10-CM | POA: Insufficient documentation

## 2023-09-24 ENCOUNTER — Encounter: Payer: Self-pay | Admitting: Podiatry

## 2023-09-24 ENCOUNTER — Ambulatory Visit (INDEPENDENT_AMBULATORY_CARE_PROVIDER_SITE_OTHER): Admitting: Podiatry

## 2023-09-24 DIAGNOSIS — L600 Ingrowing nail: Secondary | ICD-10-CM

## 2023-09-24 MED ORDER — NEOMYCIN-POLYMYXIN-HC 3.5-10000-1 OT SOLN: OTIC | 0 refills | Status: DC

## 2023-09-24 NOTE — Progress Notes (Signed)
 She presents today chief complaint of a painful ingrown toenail to the tibial border of the hallux left states about her for about the past weeks that she has been wearing her rubber outside boot.  She denies changes in her past medical history medications allergies surgeries and social history.  Objective: Vitals are stable at manage x 3.  Pulses are palpable.  She has a sharply abraded nail margin along the tibial border of the hallux left  No malodor just moderate erythema and tenderness on palpation.  Assessment: Ingrown toenail tibial border hallux left.  Plan: Chemical matricectomy's performed today after local anesthetic was administered.  The nail was split from distal to proximal the margin was avulsed exposing the matrix and nailbed.  3 applications of phenol were applied 30 seconds each neutralized with aspect alcohol Silvadene cream Telfa pad's a dressing was applied.  I will follow-up with her in a few weeks make sure she is doing well call with questions or concerns.

## 2023-09-24 NOTE — Patient Instructions (Signed)
 Epsom Salt Soak Instructions    IF SOAKING IS STILL NECESSARY, START THIS 2 WEEKS AFTER INITIAL PROCEDURE   Place 1/4 cup of epsom salts in a quart of warm tap water.  Soak your foot or feet in the solution for 20 minutes twice a day until you notice the area has dried and a scab has formed. Continue to apply other medications to the area as directed by the doctor such as polysporin, neosporin or cortisporin drops.  IF YOUR SKIN BECOMES IRRITATED WHILE USING THESE INSTRUCTIONS, IT IS OKAY TO SWITCH TO  WHITE VINEGAR AND WATER. Or you may use antibacterial soap and water to keep the toe clean  Monitor for any signs/symptoms of infection. Call the office immediately if any occur or go directly to the emergency room. Call with any questions/concerns.   Betadine Soak Instructions  Purchase an 8 oz. bottle of BETADINE solution (Povidone)  THE DAY AFTER THE PROCEDURE  Place 1 tablespoon of betadine solution in a quart of warm tap water.  Submerge your foot or feet with outer bandage intact for the initial soak; this will allow the bandage to become moist and wet for easy lift off.  Once you remove your bandage, continue to soak in the solution for 20 minutes.  This soak should be done twice a day.  Next, remove your foot or feet from solution, blot dry the affected area and cover.  You may use a band aid large enough to cover the area or use gauze and tape.  Apply other medications to the area as directed by the doctor such as cortisporin otic solution (ear drops) or neosporin.  IF YOUR SKIN BECOMES IRRITATED WHILE USING THESE INSTRUCTIONS, IT IS OKAY TO SWITCH TO EPSOM SALTS AND WATER OR WHITE VINEGAR AND WATER.   Long Term Care Instructions-Post Nail Surgery  You have had your ingrown toenail and root treated with a chemical.  This chemical causes a burn that will drain and ooze like a blister.  This can drain for 6-8 weeks or longer.  It is important to keep this area clean, covered, and follow  the soaking instructions dispensed at the time of your surgery.  This area will eventually dry and form a scab.  Once the scab forms you no longer need to soak or apply a dressing.  If at any time you experience an increase in pain, redness, swelling, or drainage, you should contact the office as soon as possible.

## 2023-10-07 ENCOUNTER — Encounter: Payer: Self-pay | Admitting: Podiatry

## 2023-10-07 ENCOUNTER — Ambulatory Visit

## 2023-10-07 DIAGNOSIS — L600 Ingrowing nail: Secondary | ICD-10-CM | POA: Diagnosis not present

## 2023-10-07 NOTE — Progress Notes (Signed)
       Subjective:  Patient ID: Kayla Stokes, female    DOB: 1945/07/07,  MRN: [id number]  Chief Complaint  Patient presents with   Ingrown Toenail     L big toe ingrown removed by Dr Verta September 24, 2023. Has been soaking. Was draining until two days ago. Small amount of blood on dressing.    Kayla Stokes presents to clinic today for f/u of PNA to the left hallux medial border with Dr. Verta.  Reports minimal soreness.  She has had some scant drainage at this point.  She is going out of town this weekend and wanted to check on it and make sure she is healing well.  PCP is Eksir, Lucie LABOR, MD.  Allergies  Allergen Reactions   Mobic [Meloxicam]    Nitrofurantoin Monohyd Macro Other (See Comments)    SEVERE STOMACH PAIN    Objective:  There were no vitals filed for this visit.  Vascular Examination: Capillary refill time is less than 3 seconds to toes bilateral. Palpable pedal pulses b/l LE. Digital hair present b/l. No pedal edema b/l. Skin temperature gradient WNL b/l. No varicosities b/l. No cyanosis or clubbing noted b/l.   Dermatological Examination: Upon inspection of the PNA site, there are no clinical signs of infection.  No purulence, no necrosis, no malodor present.  Minimal to no erythema present.  Eschar formed along nail margin.  No drainage on examination. Minimal to no pain on palpation of area.   Assessment/Plan: 1. Ingrown left big toenail     No orders of the defined types were placed in this encounter.  Clinical findings discussed with patient.  Showing good signs of healing.  Minimal drainage at this point.  She can continue soaking as needed if she continues to have some drainage and allow the toe to air dry.  Recommend that she continue bandage when she is out and about but otherwise she can leave the toe open to air.  She can follow-up as needed if she has concerns with the healing progression over the next 1 to 2 weeks. Of note she is starting  ciprofloxacin  for a UTI.   Return if symptoms worsen or fail to improve.   Kayla Stokes, AACFAS [facility]     [address] [place], [place] [address]                Office [phone]  Fax [phone] Left hallux medial nail f/up

## 2023-10-15 ENCOUNTER — Ambulatory Visit: Admitting: Podiatry

## 2023-10-15 ENCOUNTER — Encounter: Payer: Self-pay | Admitting: Podiatry

## 2023-10-15 DIAGNOSIS — L600 Ingrowing nail: Secondary | ICD-10-CM

## 2023-10-15 NOTE — Progress Notes (Signed)
 She presents today for nail check she is just concerned that the nail may not have healed up all the way she refers to the left hallux tibial border.  She states that she has not been soaking it lately but has been outside working in the yard is getting a slight burning to the very tip of the toe.  Objective: Vital signs stable alert oriented x 3.  There is no erythema edema cellulitis drainage or odor mild redness at the very end but does not appear to be infected more of just an inflammation I think.  Assessment: Mild paronychia mild inflammatory process.  Plan: Recommended Epsom salts and warm water soaks in a very very thin layer of Neosporin just to keep it moisturized.  I will follow-up with her as needed.

## 2023-12-23 ENCOUNTER — Other Ambulatory Visit: Payer: Self-pay | Admitting: Surgery

## 2023-12-23 DIAGNOSIS — I83893 Varicose veins of bilateral lower extremities with other complications: Secondary | ICD-10-CM

## 2024-01-27 ENCOUNTER — Encounter: Payer: Self-pay | Admitting: Nurse Practitioner

## 2024-01-27 ENCOUNTER — Ambulatory Visit: Admitting: Nurse Practitioner

## 2024-01-27 VITALS — BP 130/82 | HR 79 | Ht 62.25 in | Wt 170.0 lb

## 2024-01-27 DIAGNOSIS — N952 Postmenopausal atrophic vaginitis: Secondary | ICD-10-CM

## 2024-01-27 DIAGNOSIS — N958 Other specified menopausal and perimenopausal disorders: Secondary | ICD-10-CM

## 2024-01-27 DIAGNOSIS — Z01419 Encounter for gynecological examination (general) (routine) without abnormal findings: Secondary | ICD-10-CM

## 2024-01-27 DIAGNOSIS — Z87440 Personal history of urinary (tract) infections: Secondary | ICD-10-CM

## 2024-01-27 DIAGNOSIS — B009 Herpesviral infection, unspecified: Secondary | ICD-10-CM

## 2024-01-27 DIAGNOSIS — Z9189 Other specified personal risk factors, not elsewhere classified: Secondary | ICD-10-CM | POA: Diagnosis not present

## 2024-01-27 DIAGNOSIS — Z780 Asymptomatic menopausal state: Secondary | ICD-10-CM

## 2024-01-27 MED ORDER — ESTRADIOL 0.01 % VA CREA: 1.0000 g | TOPICAL_CREAM | VAGINAL | 3 refills | Status: AC

## 2024-01-27 NOTE — Progress Notes (Signed)
 Kayla Stokes 09-May-1945 [id number]  History:  78 y.o. G3P3003 presents for breast and pelvic exam. Postmenopausal - no HRT. S/P 1989 TVH with A & P repair.  Normal pap history. Vaginal estrogen for recurrent UTIs, good management. PFPT for urinary incontinence again this year and has had improvement. HTN, GERD, CKD3 managed by PCP.   Gynecologic History No LMP recorded. Patient has had a hysterectomy.   Contraception/Family planning: status post hysterectomy Sexually active: No  Health Maintenance Last Pap: 2012. Results were: Normal Last mammogram: 01/13/2024. Results were: Normal Last colonoscopy: 09/16/2018. Results were: polyps, 7-year recall Last Dexa: 12/2023 per patient. Results were: Normal  Past medical history, past surgical history, family history and social history were all reviewed and documented in the EPIC chart. Married. 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 Grandchildren ages 67-15.  Mother with history of colon cancer at age 38.   ROS:  A ROS was performed and pertinent positives and negatives are included.  Exam:  Vitals:   01/27/24 1158  BP: 130/82  Pulse: 79  SpO2: 97%  Weight: 170 lb (77.1 kg)  Height: 5' 2.25 (1.581 m)      Body mass index is 30.84 kg/m.  General appearance:  Normal Thyroid:  Symmetrical, normal in size, without palpable masses or nodularity. Respiratory  Auscultation:  Clear without wheezing or rhonchi Cardiovascular  Auscultation:  Regular rate, without rubs, murmurs or gallops  Edema/varicosities:  Not grossly evident Abdominal  Soft,nontender, without masses, guarding or rebound.  Liver/spleen:  No organomegaly noted  Hernia:  None appreciated  Skin  Inspection:  Grossly normal   Breasts: Examined lying and sitting.   Right: Without masses, retractions, discharge or axillary adenopathy.   Left: Without masses, retractions, discharge or axillary adenopathy. Pelvic: External genitalia:  no lesions              Urethra:  normal  appearing urethra with no masses, tenderness or lesions              Bartholins and Skenes: normal                 Vagina: normal appearing vagina with normal color and discharge, no lesions. 1+ cystocele and rectocele, atrophic changes              Cervix: absent Bimanual Exam:  Uterus: absent              Adnexa: no mass, fullness, tenderness              Rectovaginal: Deferred              Anus:  normal, no lesions  Kayla Stokes, CMA present as chaperone.   Assessment/Plan:  78 y.o. [id number] for breast and pelvic exam.   Encounter for breast and pelvic examination - Education provided on SBEs, importance of preventative screenings, current guidelines, high calcium  diet, regular exercise, and multivitamin daily. Labs with PCP.    Postmenopausal - No HRT.   Genitourinary syndrome of menopause - Plan: estradiol  (ESTRACE ) 0.01 % CREA vaginal cream twice weekly. Good management.   Screening for cervical cancer - Normal pap history. No longer screening per guidelines.  Screening for breast cancer - Normal mammogram history. Continue annual screenings. Normal breast exam today.   Screening for colon cancer - 2020 colonoscopy. will repeat at GI's recommended interval. Mother with history of colon cancer.  Screening for osteoporosis - Normal DXA last month. Plans to get back into yoga and working out a few days  a week.   Return in about 1 year (around 01/26/2025) for B&P (high risk).       Kayla Stokes [facility] 12:14 PM 01/27/2024

## 2024-02-02 ENCOUNTER — Ambulatory Visit (INDEPENDENT_AMBULATORY_CARE_PROVIDER_SITE_OTHER): Admitting: Physician Assistant

## 2024-02-02 ENCOUNTER — Encounter: Payer: Self-pay | Admitting: Physician Assistant

## 2024-02-02 ENCOUNTER — Ambulatory Visit (HOSPITAL_COMMUNITY)
Admission: RE | Admit: 2024-02-02 | Discharge: 2024-02-02 | Disposition: A | Source: Ambulatory Visit | Attending: Surgery | Admitting: Surgery

## 2024-02-02 VITALS — BP 143/83 | HR 60 | Temp 97.8°F | Wt 172.3 lb

## 2024-02-02 DIAGNOSIS — I83893 Varicose veins of bilateral lower extremities with other complications: Secondary | ICD-10-CM

## 2024-02-07 NOTE — Progress Notes (Signed)
 Requested by:  Lynwood Laneta ORN, PA-C [address]  [place] [address]  Reason for consultation: leg swelling    History of Present Illness   Kayla Stokes is a 78 y.o. (1945/10/07) female who presents for repeat evaluation of venous insufficiency. She has a history of left greater saphenous vein ligation and stripping from the ankle to knee by Dr.Lawson in 2004. She subsequently underwent left greater saphenous vein ablation by Dr.Lawson in 2013.  She states she has continued to have leg swelling, left greater than right, for the past several years since her procedures. This causes her discomfort later on in the day. She has tried to control this with leg elevation. She has not been able to put on compression stockings recently due to rotator cuff injury. Her PCP suggested her veins get checked out again to ensure there was nothing else that could be done.   Past Medical History:  Diagnosis Date   Allergic rhinitis    Arthritis    finger   Bursitis of right hip    Complication of anesthesia    difficult to awaken after gallbladder surgery   Dental crowns present    Eczema    elbow   GERD (gastroesophageal reflux disease)    H. pylori infection    History of hiatal hernia    HSV infection    oral   Hypertension    under control, has been on med. x 10 yrs.   Mass of hand 05/26/2011   right   Measles    hx of    Mumps    hx of    Osteopenia 11/24/2012   T score -1.1 left forearm FRAX 6.2%/0.1%   Pancreatitis    Rubella    hx of    Trochanteric bursitis of left hip     Past Surgical History:  Procedure Laterality Date   ABDOMINAL HYSTERECTOMY     ABLATION SAPHENOUS VEIN W/ RFA  10/2011   CATARACT EXTRACTION W/ INTRAOCULAR LENS  IMPLANT, BILATERAL  09/2010, 01/2011   bilateral   CHOLECYSTECTOMY  06/2012   CHONDROPLASTY  02/12/2011   Procedure: CHONDROPLASTY;  Surgeon: Dempsey LULLA Moan;  Location: [facility];  Service: Orthopedics;  Laterality:  Left;   EYE SURGERY     laser right eye; due to right eye hemorrhage; had left eye retinal tear with subsequent surgery   KNEE ARTHROSCOPY  02/12/2011   Procedure: ARTHROSCOPY KNEE;  Surgeon: Dempsey LULLA Moan;  Location: [facility];  Service: Orthopedics;  Laterality: Left;   KNEE SURGERY  2021   MASS EXCISION  06/05/2011   Procedure: EXCISION MASS;  Surgeon: Prentice ORN Pagan, MD;  Location: [facility];  Service: Orthopedics;  Laterality: Right;  right thumb   MENISCUS DEBRIDEMENT  02/12/2011   Procedure: DEBRIDEMENT OF MENISCUS;  Surgeon: Dempsey LULLA Moan;  Location: [facility];  Service: Orthopedics;  Laterality: Left;   MOUTH SURGERY     implants    ROTATOR CUFF REPAIR Right    06/2021   stent placement after cholecestectomy     TOTAL KNEE ARTHROPLASTY Left 05/22/2014   Procedure: LEFT TOTAL KNEE ARTHROPLASTY;  Surgeon: Dempsey Moan, MD;  Location: [facility];  Service: Orthopedics;  Laterality: Left;   TUBAL LIGATION     VAGINAL HYSTERECTOMY WITH A&P REPAIR  1989   VARICOSE VEIN SURGERY  02/13/2003   ligation and stripping left greater saphenous; exc.  multiple varicosities  Social History   Socioeconomic History   Marital status: Married    Spouse name: Not on file   Number of children: Not on file   Years of education: Not on file   Highest education level: Not on file  Occupational History   Not on file  Tobacco Use   Smoking status: Never    Passive exposure: Never   Smokeless tobacco: Never  Vaping Use   Vaping status: Never Used  Substance and Sexual Activity   Alcohol use: Yes    Comment: Occas   Drug use: No   Sexual activity: Not Currently    Partners: Male    Birth control/protection: Post-menopausal    Comment: intercourse age 18, more than 5 sexual partners, des neg  Other Topics Concern   Not on file  Social History Narrative   Not on file   Social Drivers of Health   Tobacco Use: Low Risk (02/02/2024)    Patient History    Smoking Tobacco Use: Never    Smokeless Tobacco Use: Never    Passive Exposure: Never  Financial Resource Strain: Not on file  Food Insecurity: Low Risk (10/21/2023)   Received from [facility]   Epic    Within the past 12 months, you worried that your food would run out before you got money to buy more: Never true    Within the past 12 months, the food you bought just didn't last and you didn't have money to get more. : Never true  Transportation Needs: No Transportation Needs (10/21/2023)   Received from [facility]    In the past 12 months, has lack of reliable transportation kept you from medical appointments, meetings, work or from getting things needed for daily living? : No  Physical Activity: Not on file  Stress: Not on file  Social Connections: Unknown (07/31/2022)   Received from [facility]   Social Network    Social Network: Not on file  Intimate Partner Violence: Unknown (07/31/2022)   Received from [facility]   HITS    Physically Hurt: Not on file    Insult or Talk Down To: Not on file    Threaten Physical Harm: Not on file    Scream or Curse: Not on file  Depression (EYV7-0): Not on file  Alcohol Screen: Not on file  Housing: Low Risk (10/21/2023)   Received from [facility]   Epic    What is your living situation today?: I have a steady place to live    Think about the place you live. Do you have problems with any of the following? Choose all that apply:: None/None on this list  Utilities: Low Risk (10/21/2023)   Received from [facility]   Utilities    In the past 12 months has the electric, gas, oil, or water company threatened to shut off services in your home? : No  Health Literacy: Not on file    Family History  Problem Relation Age of Onset   Diabetes Mother    Hypertension Mother    Heart disease Mother    Colon cancer Mother 66   Hypertension Father    Cervical cancer Sister    Heart disease Brother     Lung cancer Brother    Throat cancer Brother    Kidney Stones Daughter    Colon polyps Neg Hx     Current Outpatient Medications  Medication Sig Dispense Refill   ALPRAZolam  (XANAX ) 0.25 MG tablet Take  0.25 mg by mouth 3 (three) times daily as needed for anxiety. For anxiety.     Azelaic Acid 15 % gel Apply topically daily.     Cranberry 450 MG TABS Take by mouth.     estradiol  (ESTRACE ) 0.01 % CREA vaginal cream Place 1 g vaginally 2 (two) times a week. 42.5 g 3   fluticasone (FLONASE) 50 MCG/ACT nasal spray Place 1 spray into both nostrils daily as needed for allergies or rhinitis.     MAGNESIUM PO Take 1 tablet by mouth daily.     Nutritional Supplements (JUICE PLUS FIBRE PO) Take by mouth.     omeprazole  (PRILOSEC) 40 MG capsule Take 40 mg by mouth daily as needed (acid reflux).   3   Polyethyl Glycol-Propyl Glycol (SYSTANE OP) Apply 1 drop to eye 3 (three) times daily as needed (dry eyes).     potassium gluconate 595 (99 K) MG TABS tablet Take 595 mg by mouth. 2 tabs qhs     Probiotic Product (PROBIOTIC PO) Take by mouth.     rosuvastatin  (CRESTOR ) 5 MG tablet Take 5 mg by mouth at bedtime.     triamcinolone  cream (KENALOG ) 0.1 % Apply 1 application topically daily as needed (itching.).     triamterene -hydrochlorothiazide  (MAXZIDE -25) 37.5-25 MG per tablet Take 1 tablet by mouth daily.  11   VITAMIN D PO Take by mouth.     No current facility-administered medications for this visit.    Allergies[1]  REVIEW OF SYSTEMS (negative unless checked):   Cardiac:  []  Chest pain or chest pressure? []  Shortness of breath upon activity? []  Shortness of breath when lying flat? []  Irregular heart rhythm?  Vascular:  []  Pain in calf, thigh, or hip brought on by walking? []  Pain in feet at night that wakes you up from your sleep? []  Blood clot in your veins? [x]  Leg swelling?  Pulmonary:  []  Oxygen at home? []  Productive cough? []  Wheezing?  Neurologic:  []  Sudden weakness in  arms or legs? []  Sudden numbness in arms or legs? []  Sudden onset of difficult speaking or slurred speech? []  Temporary loss of vision in one eye? []  Problems with dizziness?  Gastrointestinal:  []  Blood in stool? []  Vomited blood?  Genitourinary:  []  Burning when urinating? []  Blood in urine?  Psychiatric:  []  Major depression  Hematologic:  []  Bleeding problems? []  Problems with blood clotting?  Dermatologic:  []  Rashes or ulcers?  Constitutional:  []  Fever or chills?  Ear/Nose/Throat:  []  Change in hearing? []  Nose bleeds? []  Sore throat?  Musculoskeletal:  []  Back pain? []  Joint pain? []  Muscle pain?   Physical Examination     Vitals:   02/02/24 1025  BP: (!) 143/83  Pulse: 60  Temp: 97.8 F (36.6 C)  TempSrc: Temporal  Weight: 172 lb 4.8 oz (78.2 kg)   Body mass index is 31.26 kg/m.  General:  WDWN in NAD; vital signs documented above Gait: Not observed HENT: WNL, normocephalic Pulmonary: normal non-labored breathing  Cardiac: regular Abdomen: soft, NT, no masses Skin: without rashes Vascular Exam/Pulses: palpable DP pulses bilaterally Extremities: BLE with 1+ edema from the ankle to mid calf. No stasis pigmentation or venous ulcerations Musculoskeletal: no muscle wasting or atrophy  Neurologic: A&O X 3;  No focal weakness or paresthesias are detected Psychiatric:  The pt has Normal affect.  Non-invasive Vascular Imaging   L`LE Venous Insufficiency Duplex (02/02/2024):  LEFT          Reflux  Reflux  Reflux  Diameter cmsComments                                No       Yes     Time                                         +--------------+--------+------+----------+------------+-------------------  ----+  CFV                   yes  >1 second                                       +--------------+--------+------+----------+------------+-------------------  ----+  FV mid                 yes  >1 second                                        +--------------+--------+------+----------+------------+-------------------  ----+  Popliteal             yes  >1 second                                       +--------------+--------+------+----------+------------+-------------------  ----+  GSV at [facility]             yes   >500 ms      1.25                              +--------------+--------+------+----------+------------+-------------------  ----+  GSV prox thigh                                    prior                                                                       ablation/stripping        +--------------+--------+------+----------+------------+-------------------  ----+  GSV mid thigh                                     prior                                                                       ablation/stripping        +--------------+--------+------+----------+------------+-------------------  ----+  GSV dist thigh  prior                                                                       ablation/stripping        +--------------+--------+------+----------+------------+-------------------  ----+  GSV at knee                                       prior                                                                       ablation/stripping        +--------------+--------+------+----------+------------+-------------------  ----+  GSV prox calf                                     prior                                                                       ablation/stripping        +--------------+--------+------+----------+------------+-------------------  ----+  GSV mid calf  no                          0.34                              +--------------+--------+------+----------+------------+-------------------  ----+  GSV dist calf          yes   >500 ms       0.25                              +--------------+--------+------+----------+------------+-------------------  ----+  SSV at [facility]    no                          0.38                              +--------------+--------+------+----------+------------+-------------------  ----+  SSV prox calf no                          0.3                               +--------------+--------+------+----------+------------+-------------------  ----+   Medical Decision Making   ALFONSO SHACKETT is a 78 y.o. female who presents for repeat venous insufficiency  evaluation  Based on the patient's duplex, there is reflux in the left common femoral vein, femoral vein, popliteal vein, and greater saphenous vein at the saphenofemoral junction and distal calf. The remainder of the deep and superficial venous system is competent. There is no DVT or SVT. Her previous saphenous vein ablation is patent. She endorses continued bilateral lower extremity swelling, left greater than right. Her swelling is worse later on in the day and causes her generalized discomfort. She has tried to control this with leg elevation. She has not been able to wear compression stockings recently due to a rotator cuff injury On exam she has 1+ bilateral lower extremity swelling from the ankle to the mid calf. She has palpable pedal pulses. She has no stasis pigmentation or venous ulcerations. I have explained to the patient that her previous left greater saphenous vein ablation is patent and therefore she is not a candidate for any further treatment. Most of her swelling is likely due to her deep system reflux, which cannot be treated procedurally. I have recommended continued conservative treatment including regular leg elevation, avoiding prolonged sitting and standing, regular exercise, and compression therapy. I have recommended lighter compression stockings vs use of an ace wrap for compression therapy. She can follow up with our  office as needed   Ahmed SHAUNNA Holster, PA-C [facility] Office: [phone]  Clinic MD: [facility]     [1]  Allergies Allergen Reactions   Mobic [Meloxicam]    Nitrofurantoin Monohyd Macro Other (See Comments)    SEVERE STOMACH PAIN

## 2025-01-31 ENCOUNTER — Encounter: Admitting: Nurse Practitioner
